# Patient Record
Sex: Female | Born: 1970 | Race: White | Hispanic: No | Smoking: Former smoker
Health system: Southern US, Community
[De-identification: ages and names within clinical notes are randomized; demographics above are authoritative.]

## PROBLEM LIST (undated history)

## (undated) DIAGNOSIS — F32A Depression, unspecified: Secondary | ICD-10-CM

## (undated) DIAGNOSIS — Z950 Presence of cardiac pacemaker: Secondary | ICD-10-CM

## (undated) DIAGNOSIS — E7800 Pure hypercholesterolemia, unspecified: Secondary | ICD-10-CM

## (undated) DIAGNOSIS — K5900 Constipation, unspecified: Secondary | ICD-10-CM

## (undated) DIAGNOSIS — I509 Heart failure, unspecified: Secondary | ICD-10-CM

## (undated) DIAGNOSIS — M7989 Other specified soft tissue disorders: Secondary | ICD-10-CM

## (undated) DIAGNOSIS — R519 Headache, unspecified: Secondary | ICD-10-CM

## (undated) DIAGNOSIS — M549 Dorsalgia, unspecified: Secondary | ICD-10-CM

## (undated) DIAGNOSIS — R001 Bradycardia, unspecified: Secondary | ICD-10-CM

## (undated) DIAGNOSIS — F419 Anxiety disorder, unspecified: Secondary | ICD-10-CM

## (undated) DIAGNOSIS — K219 Gastro-esophageal reflux disease without esophagitis: Secondary | ICD-10-CM

## (undated) DIAGNOSIS — M2550 Pain in unspecified joint: Secondary | ICD-10-CM

## (undated) DIAGNOSIS — R569 Unspecified convulsions: Secondary | ICD-10-CM

## (undated) DIAGNOSIS — Z8774 Personal history of (corrected) congenital malformations of heart and circulatory system: Secondary | ICD-10-CM

## (undated) DIAGNOSIS — R011 Cardiac murmur, unspecified: Secondary | ICD-10-CM

## (undated) DIAGNOSIS — T7840XA Allergy, unspecified, initial encounter: Secondary | ICD-10-CM

## (undated) DIAGNOSIS — D649 Anemia, unspecified: Secondary | ICD-10-CM

## (undated) DIAGNOSIS — R002 Palpitations: Secondary | ICD-10-CM

## (undated) DIAGNOSIS — E559 Vitamin D deficiency, unspecified: Secondary | ICD-10-CM

## (undated) DIAGNOSIS — R51 Headache: Secondary | ICD-10-CM

## (undated) DIAGNOSIS — R7303 Prediabetes: Secondary | ICD-10-CM

## (undated) HISTORY — DX: Constipation, unspecified: K59.00

## (undated) HISTORY — DX: Cardiac murmur, unspecified: R01.1

## (undated) HISTORY — DX: Gastro-esophageal reflux disease without esophagitis: K21.9

## (undated) HISTORY — PX: CARDIAC VALVE REPLACEMENT: SHX585

## (undated) HISTORY — PX: TUBAL LIGATION: SHX77

## (undated) HISTORY — DX: Bradycardia, unspecified: R00.1

## (undated) HISTORY — PX: TONSILLECTOMY: SHX5217

## (undated) HISTORY — PX: TETRALOGY OF FALLOT REPAIR: SHX796

## (undated) HISTORY — DX: Anxiety disorder, unspecified: F41.9

## (undated) HISTORY — DX: Depression, unspecified: F32.A

## (undated) HISTORY — DX: Allergy, unspecified, initial encounter: T78.40XA

## (undated) HISTORY — DX: Heart failure, unspecified: I50.9

## (undated) HISTORY — DX: Pure hypercholesterolemia, unspecified: E78.00

## (undated) HISTORY — PX: PACEMAKER INSERTION: SHX728

## (undated) HISTORY — DX: Anemia, unspecified: D64.9

## (undated) HISTORY — DX: Other specified soft tissue disorders: M79.89

## (undated) HISTORY — DX: Prediabetes: R73.03

## (undated) HISTORY — DX: Pain in unspecified joint: M25.50

## (undated) HISTORY — DX: Palpitations: R00.2

## (undated) HISTORY — DX: Vitamin D deficiency, unspecified: E55.9

## (undated) HISTORY — DX: Dorsalgia, unspecified: M54.9

---
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
## 1998-05-07 ENCOUNTER — Other Ambulatory Visit: Admission: RE | Admit: 1998-05-07 | Discharge: 1998-05-07 | Payer: Self-pay | Admitting: *Deleted

## 1999-09-17 ENCOUNTER — Other Ambulatory Visit: Admission: RE | Admit: 1999-09-17 | Discharge: 1999-09-17 | Payer: Self-pay | Admitting: *Deleted

## 2000-08-23 DIAGNOSIS — R569 Unspecified convulsions: Secondary | ICD-10-CM

## 2000-08-23 DIAGNOSIS — I509 Heart failure, unspecified: Secondary | ICD-10-CM

## 2000-08-23 HISTORY — DX: Heart failure, unspecified: I50.9

## 2000-08-23 HISTORY — DX: Unspecified convulsions: R56.9

## 2000-11-01 ENCOUNTER — Other Ambulatory Visit: Admission: RE | Admit: 2000-11-01 | Discharge: 2000-11-01 | Payer: Self-pay | Admitting: *Deleted

## 2001-07-16 ENCOUNTER — Inpatient Hospital Stay (HOSPITAL_COMMUNITY): Admission: AD | Admit: 2001-07-16 | Discharge: 2001-07-16 | Payer: Self-pay | Admitting: *Deleted

## 2001-07-26 ENCOUNTER — Encounter (INDEPENDENT_AMBULATORY_CARE_PROVIDER_SITE_OTHER): Payer: Self-pay

## 2001-07-26 ENCOUNTER — Inpatient Hospital Stay (HOSPITAL_COMMUNITY): Admission: AD | Admit: 2001-07-26 | Discharge: 2001-07-29 | Payer: Self-pay | Admitting: Obstetrics and Gynecology

## 2001-08-02 ENCOUNTER — Inpatient Hospital Stay (HOSPITAL_COMMUNITY): Admission: EM | Admit: 2001-08-02 | Discharge: 2001-08-06 | Payer: Self-pay | Admitting: *Deleted

## 2001-08-03 ENCOUNTER — Encounter: Payer: Self-pay | Admitting: Internal Medicine

## 2001-08-04 ENCOUNTER — Encounter: Payer: Self-pay | Admitting: Internal Medicine

## 2001-08-04 ENCOUNTER — Encounter: Payer: Self-pay | Admitting: Neurology

## 2001-08-05 ENCOUNTER — Encounter: Payer: Self-pay | Admitting: Cardiology

## 2001-08-31 ENCOUNTER — Other Ambulatory Visit: Admission: RE | Admit: 2001-08-31 | Discharge: 2001-08-31 | Payer: Self-pay | Admitting: *Deleted

## 2003-02-24 ENCOUNTER — Emergency Department (HOSPITAL_COMMUNITY): Admission: EM | Admit: 2003-02-24 | Discharge: 2003-02-25 | Payer: Self-pay | Admitting: *Deleted

## 2003-02-24 ENCOUNTER — Encounter: Payer: Self-pay | Admitting: *Deleted

## 2005-05-24 ENCOUNTER — Other Ambulatory Visit: Admission: RE | Admit: 2005-05-24 | Discharge: 2005-05-24 | Payer: Self-pay | Admitting: Obstetrics and Gynecology

## 2006-05-02 ENCOUNTER — Ambulatory Visit: Payer: Self-pay

## 2006-08-10 ENCOUNTER — Ambulatory Visit: Payer: Self-pay | Admitting: Internal Medicine

## 2006-10-28 ENCOUNTER — Emergency Department (HOSPITAL_COMMUNITY): Admission: EM | Admit: 2006-10-28 | Discharge: 2006-10-28 | Payer: Self-pay | Admitting: Emergency Medicine

## 2006-11-08 ENCOUNTER — Ambulatory Visit (HOSPITAL_COMMUNITY): Admission: RE | Admit: 2006-11-08 | Discharge: 2006-11-08 | Payer: Self-pay | Admitting: Orthopedic Surgery

## 2006-11-09 ENCOUNTER — Ambulatory Visit: Payer: Self-pay | Admitting: Internal Medicine

## 2007-02-10 ENCOUNTER — Ambulatory Visit: Payer: Self-pay | Admitting: Internal Medicine

## 2007-07-11 ENCOUNTER — Ambulatory Visit: Payer: Self-pay | Admitting: Internal Medicine

## 2007-10-16 ENCOUNTER — Ambulatory Visit: Payer: Self-pay | Admitting: Internal Medicine

## 2008-02-19 ENCOUNTER — Ambulatory Visit: Payer: Self-pay

## 2008-03-02 ENCOUNTER — Emergency Department (HOSPITAL_COMMUNITY): Admission: EM | Admit: 2008-03-02 | Discharge: 2008-03-02 | Payer: Self-pay | Admitting: Emergency Medicine

## 2008-07-09 ENCOUNTER — Ambulatory Visit: Payer: Self-pay | Admitting: Internal Medicine

## 2008-12-06 ENCOUNTER — Encounter (INDEPENDENT_AMBULATORY_CARE_PROVIDER_SITE_OTHER): Payer: Self-pay | Admitting: *Deleted

## 2009-01-08 ENCOUNTER — Encounter: Payer: Self-pay | Admitting: Internal Medicine

## 2009-01-08 ENCOUNTER — Ambulatory Visit: Payer: Self-pay

## 2009-06-22 ENCOUNTER — Emergency Department (HOSPITAL_COMMUNITY): Admission: EM | Admit: 2009-06-22 | Discharge: 2009-06-22 | Payer: Self-pay | Admitting: Emergency Medicine

## 2009-10-13 DIAGNOSIS — I509 Heart failure, unspecified: Secondary | ICD-10-CM | POA: Insufficient documentation

## 2009-10-13 DIAGNOSIS — I495 Sick sinus syndrome: Secondary | ICD-10-CM

## 2009-10-14 ENCOUNTER — Ambulatory Visit: Payer: Self-pay | Admitting: Internal Medicine

## 2009-10-16 DIAGNOSIS — Z950 Presence of cardiac pacemaker: Secondary | ICD-10-CM

## 2009-10-16 DIAGNOSIS — I10 Essential (primary) hypertension: Secondary | ICD-10-CM

## 2010-05-07 ENCOUNTER — Encounter: Payer: Self-pay | Admitting: Internal Medicine

## 2010-05-07 ENCOUNTER — Ambulatory Visit: Payer: Self-pay

## 2010-09-24 NOTE — Procedures (Signed)
Summary: pcp   Current Medications (verified): 1)  Zyrtec Allergy 10 Mg Tabs (Cetirizine Hcl) .... As Needed  Allergies (verified): 1)  * Sulfa  PPM Specifications Following MD:  Lewayne Bunting, MD     PPM Vendor:  Medtronic     PPM Model Number:  [id number]     PPM Serial Number:  [id number] PPM DOI:  08/03/2001      Lead 1    Location: RA     DOI: 08/03/2001     Model #: [id number]     Serial #: [id number]     Status: active Lead 2    Location: RV     DOI: 08/03/2001     Model #: [id number]     Serial #: [id number]     Status: active  Magnet Response Rate:  BOL 85 ERI  65  Indications:  BRADYCARDIA, TETRALOGY REPAIR 1975    PPM Follow Up Battery Voltage:  2.69 V     Battery Est. Longevity:  14 mths     Pacer Dependent:  No       PPM Device Measurements Atrium  Amplitude: 4.00 mV, Impedance: 525 ohms, Threshold: 0.50 V at 0.40 msec Right Ventricle  Amplitude: 22.40 mV, Impedance: 536 ohms, Threshold: 0.50 V at 0.40 msec  Episodes MS Episodes:  12     Percent Mode Switch:  <0.1%     Coumadin:  No Ventricular High Rate:  0     Atrial Pacing:  36.2%     Ventricular Pacing:  3.0%  Parameters Mode:  DDDR     Lower Rate Limit:  50     Upper Rate Limit:  130 Paced AV Delay:  200     Sensed AV Delay:  140 Next Cardiology Appt Due:  10/22/2010 Tech Comments:  NORMAL DEVICE FUNCTION.  CHANGED RA OUTPUT FROM 1.5 TO 2.0 AND RV OUTPUT FROM 1.625 TO 2.50 V.  ROV IN 6 MTHS W/GT. Vella Kohler  May 07, 2010 11:29 AM

## 2010-09-24 NOTE — Cardiovascular Report (Signed)
Summary: Office Visit   Office Visit   Imported By: Roderic Ovens 10/23/2009 14:00:14  _____________________________________________________________________  External Attachment:    Type:   Image     Comment:   External Document

## 2010-09-24 NOTE — Assessment & Plan Note (Signed)
Summary: pc2/sl   Visit Type:  Follow-up Primary Provider:  Patrecia Pace, MD [facility]  CC:  swelling in hands and ankles..  History of Present Illness: Tonya Gardner returns today for followup of her PPM along with HTN and bradycardia.  She is a pleasant 40 yo woman who developed severe bradycardia after her pregnancy and underwent PPM.  She was ultimately found to have eclampsia which maniested several days after delivery.  She returns for followup.  The patient has lost about 40 lbs since her last visit.  She is exercising regularly.  Current Medications (verified): 1)  Zyrtec Allergy 10 Mg Tabs (Cetirizine Hcl) .... 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  Review of Systems  The patient denies chest pain, syncope, dyspnea on exertion, and peripheral edema.    Vital Signs:  Patient profile:   40 year old female Height:      62 inches Weight:      177 pounds BMI:     32.49 Pulse rate:   60 / minute BP sitting:   122 / 88  (left arm)  Vitals Entered By: Laurance Flatten CMA (October 14, 2009 9:01 AM)  Physical Exam  General:  Obese, well developed, well nourished, in no acute distress.  HEENT: normal Neck: supple. No JVD. Carotids 2+ bilaterally no bruits Cor: RRR no rubs, gallops or murmur Lungs: CTA Ab: soft, nontender. nondistended. No HSM. Good bowel sounds Ext: warm. no cyanosis, clubbing or edema Neuro: alert and oriented. Grossly nonfocal. affect pleasant    PPM Specifications Following MD:  Lewayne Bunting, MD     PPM Vendor:  Medtronic     PPM Model Number:  [id number]     PPM Serial Number:  [id number] H PPM DOI:  08/03/2001      Lead 1    Location: RA     DOI: 08/03/2001     Model #: 9562     Serial #:  [id number]     Status: active Lead 2    Location: RV     DOI: 08/03/2001     Model #: 6962     Serial #: [id number]     Status: active  Magnet Response Rate:  BOL 85 ERI  65  Indications:  BRADYCARDIA, TETRALOGY REPAIR 1975    PPM Follow Up Remote Check?  No Battery Voltage:  2.71 V     Battery Est. Longevity:  19 months     Pacer Dependent:  No       PPM Device Measurements Atrium  Amplitude: 1.4 mV, Impedance: 512 ohms, Threshold: 0.5 V at 0.4 msec Right Ventricle  Amplitude: 15.68 mV, Impedance: 540 ohms, Threshold: 0.75 V at 0.4 msec  Episodes MS Episodes:  11     Percent Mode Switch:  <0.1%     Coumadin:  No Ventricular High Rate:  1     Atrial Pacing:  32.8%     Ventricular Pacing:  3.0%  Parameters Mode:  DDDR     Lower Rate Limit:  50     Upper Rate Limit:  130 Paced AV Delay:  200     Sensed AV Delay:  140 Tech Comments:  No parameter changes.  Checked by [employer].  ROV 6 months clinic. Altha Harm, LPN  October 14, 2009 9:30 AM  MD Comments:  Agree with above.  Impression & Recommendations:  Problem # 1:  CARDIAC PACEMAKER IN SITU (ICD-V45.01) Her device is working normally though she has less than 2 yrs of longevity. Will recheck in several months.  Problem # 2:  ESSENTIAL HYPERTENSION, BENIGN (ICD-401.1) Her blood pressure is well controlled.  I have encouraged her to continue to exercise, and maintain a low sodium diet.  Patient Instructions: 1)  Your physician recommends that you schedule a follow-up appointment in: 6 months with device clinic and 12 months with Dr Ladona Ridgel

## 2010-09-24 NOTE — Cardiovascular Report (Signed)
Summary: Office Visit   Office Visit   Imported By: Roderic Ovens 05/11/2010 15:54:32  _____________________________________________________________________  External Attachment:    Type:   Image     Comment:   External Document

## 2010-11-03 ENCOUNTER — Encounter: Payer: Self-pay | Admitting: Internal Medicine

## 2010-11-03 ENCOUNTER — Encounter (INDEPENDENT_AMBULATORY_CARE_PROVIDER_SITE_OTHER): Payer: Self-pay | Admitting: Internal Medicine

## 2010-11-03 DIAGNOSIS — I495 Sick sinus syndrome: Secondary | ICD-10-CM

## 2010-11-03 DIAGNOSIS — I10 Essential (primary) hypertension: Secondary | ICD-10-CM

## 2010-11-10 NOTE — Assessment & Plan Note (Signed)
Summary: pacer check/cy   Visit Type:  Follow-up Primary Provider:  Patrecia Pace, MD [facility]   History of Present Illness: Tonya Gardner returns today for followup of her PPM along with HTN and bradycardia.  She is a pleasant 40 yo woman who developed severe bradycardia after her pregnancy and underwent PPM.  She was ultimately found to have eclampsia which maniested several days after delivery.  She returns for followup.  The patient has had no syncope. It is unclear whether she needs her PPM any more.  Current Medications (verified): 1)  Zyrtec Allergy 10 Mg Tabs (Cetirizine Hcl) .... 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  Vital Signs:  Patient profile:   40 year old female Height:      62 inches Weight:      180 pounds BMI:     33.04 Pulse rate:   60 / minute BP sitting:   110 / 70  (left arm)  Vitals Entered By: Tonya Gardner CMA (November 03, 2010 9:08 AM)  Physical Exam  General:  Obese, well developed, well nourished, in no acute distress.  HEENT: normal Neck: supple. No JVD. Carotids 2+ bilaterally no bruits Cor: RRR no rubs, gallops or murmur Lungs: CTA. Well healed PPM incision. Ab: soft, nontender. nondistended. No HSM. Good bowel sounds Ext: warm. no cyanosis, clubbing or edema Neuro: alert and oriented. Grossly nonfocal. affect pleasant    PPM Specifications Following MD:  Lewayne Bunting, MD     PPM Vendor:  Medtronic     PPM Model Number:  [id number]     PPM Serial Number:  [id number] H PPM DOI:  08/03/2001      Lead 1    Location: RA     DOI: 08/03/2001     Model #: 9147     Serial #: [id number]     Status: active Lead 2    Location: RV     DOI: 08/03/2001     Model #: 8657     Serial #: [id number]     Status:  active  Magnet Response Rate:  BOL 85 ERI  65  Indications:  BRADYCARDIA, TETRALOGY REPAIR 1975    PPM Follow Up Pacer Dependent:  No      Episodes Coumadin:  No  Parameters Mode:  DDDR     Lower Rate Limit:  50     Upper Rate Limit:  130 Paced AV Delay:  200     Sensed AV Delay:  140 MD Comments:  She is approaching ERI.  Impression & Recommendations:  Problem # 1:  CARDIAC PACEMAKER IN SITU (ICD-V45.01) Her device is working normally but is approaching ERI. It is unclear whether she still needs pacing. She is atrial pacing about 30% of the time but this could be related to HR's just below her lower rate. I have recommended that we turn her PPM down and see how she does. If she cannot tell a difference with her PPM set at 35/min, then I would recommend that we not replace the PPM.  Problem # 2:  ESSENTIAL HYPERTENSION, BENIGN (ICD-401.1) Her blood pressure appears to be mostly well controlled.  Patient Instructions: 1)  Your physician wants you to follow-up in: 2 months in the device clinic and 4 months with Dr Johney Frame  You will receive a reminder letter in the mail two months in advance. If you don't receive a letter, please call our office to schedule the follow-up appointment. 2)  Your physician recommends that you continue on your current medications as directed. Please refer to the Current Medication list given to you today.

## 2010-11-10 NOTE — Cardiovascular Report (Signed)
Summary: Office Visit   Office Visit   Imported By: Roderic Ovens 11/04/2010 11:53:28  _____________________________________________________________________  External Attachment:    Type:   Image     Comment:   External Document

## 2011-01-05 NOTE — Assessment & Plan Note (Signed)
[facility]                         ELECTROPHYSIOLOGY OFFICE NOTE   Tonya Gardner, Tonya Gardner                       MRN:          [id number]  DATE:07/09/2008                            DOB:          1970/08/27    Ms. Roan returns today for followup.  She is status post implantation  of a Medtronic Kappa 900 dual-chamber pacemaker back in December 2002.  She denies chest pain or shortness of breath.  She had no specific  complaints today.   MEDICATIONS:  The patient is on no medicines.   On physical exam, she is a pleasant, well-appearing woman in no  distress.  Blood pressure was 110/66, the pulse 79 and regular,  respirations were 18.  The weight was 196 pounds.  Neck revealed no  jugular venous distention.  Lungs clear bilaterally to auscultation.  No  wheezes, rales, or rhonchi are present.  No increased work of breathing.  Cardiovascular exam revealed a regular rate and rhythm.  Normal S1 and  S2.  Abdominal exam was soft and nontender.  Extremities demonstrated no  edema.   Interrogation of her pacemaker demonstrates a Medtronic Kappa 900.  The  P and R waves were 2 and 15, respectively.  The impedance 506 in the A  and 553 in the V.  The threshold 0.25 at 0.4 in the A and 0.5 at 0.4 in  the RV.  The battery voltage was 2.74 V.  The patient was mode switched  4 times less than 0.1%, 47% A paced and 2% V paced.  Today, the  pacemaker was reprogrammed today with outputs decreased in the atrium to  1.75 at 0.27 down from 2.4 and the atrial rate was decreased down to 50  beats per minute.  Hopefully, we can reduce battery drain by doing this.   IMPRESSION:  1. Symptomatic bradycardia.  2. Status post pacemaker insertion.   DISCUSSION:  Ms. Partin is stable.  I have asked that she lose some  weight today.  She has gradually gained weight over the last several  years.  She has had no other complaints, and we will plan to see the  patient back in the  office in 1 year.     Doylene Canning. Ladona Ridgel, MD  Electronically Signed    GWT/MedQ  DD: 07/09/2008  DT: 07/10/2008  Job #: [id number]

## 2011-01-05 NOTE — Assessment & Plan Note (Signed)
[facility]                         ELECTROPHYSIOLOGY OFFICE NOTE   OLIMPIA, TINCH                       MRN:          [id number]  DATE:07/11/2007                            DOB:          1971-01-03    Ms. Rembold returns today for followup.  She is a very pleasant 40-year-  old woman with a history of symptomatic bradycardia, which occurred in  the setting of eclampsia.  Her bradycardia preceded her eclampsia and  she underwent permanent pacemaker insertion.  She returns today for  followup.  She denies chest pain or shortness of breath.  She has  otherwise been stable.  She has just joined a gym and is trying to lose  some weight.   MEDICATIONS:  She is on no medicines.   EXAM:  She is a pleasant, young woman, in no acute distress.  Her blood pressure was 121/84, pulse was 84 and regular, respirations  were 18, the weight was 177 pounds.  NECK:  Revealed no jugular venous distention.  LUNGS:  Clear bilaterally to auscultation, no wheezes, rales or rhonchi.  There is no increased work of breathing.  CARDIOVASCULAR EXAM:  Revealed a regular rate and rhythm with normal S1  and S2.  EXTREMITIES:  Demonstrated no edema.   Interrogation of her pacemaker demonstrates a Medtronic Kappa 900 with P-  waves of 1.5 and R-waves of 15.  The impedance 527 in the atrium, 549 in  the ventricle, the threshold 0.5 at 0.4 in the atrium, 0.75 at 0.4 in  the right ventricle.  She was pacing in the atrium 45% of the time, in  the ventricle 3% of the time.  Her AV delay was 200/140.   IMPRESSION:  1. Symptomatic bradycardia.  2. Status post pacemaker insertion.   DISCUSSION:  Overall, Ms. Pagett is stable.  Her pacemaker is working  normally.  We plan to see her back in the office in one year for device  followup.     Doylene Canning. Ladona Ridgel, MD  Electronically Signed    GWT/MedQ  DD: 07/11/2007  DT: 07/12/2007  Job #: [id number]

## 2011-01-08 NOTE — Op Note (Signed)
[facility]  Patient:    Tonya Gardner, Tonya Gardner Visit Number: [id number] MRN: [id number]          Service Type: OBS Location: [id number] Attending Physician:  Rhina Brackett Dictated by:   Juluis Mire, M.D. Proc. Date: 07/28/01 Admit Date:  07/26/2001 Discharge Date: 07/29/2001                             Operative Report  PREOPERATIVE DIAGNOSIS:       Multiparity, desires sterility.  POSTOPERATIVE DIAGNOSIS:      Multiparity, desires sterility.  OPERATIVE PROCEDURE:          Postpartum bilateral tubal ligation.  SURGEON:                      Juluis Mire, M.D.  ANESTHESIA:                   Epidural.  ESTIMATED BLOOD LOSS:         Minimal.  PACKS AND DRAINS:             None.  INTRAOPERATIVE BLOOD REPLACED:               None.  COMPLICATIONS:                None.  INDICATIONS:                  The patient is a 40 year old gravida 3, para 1-1-0-2 married white female who had a spontaneous vaginal delivery yesterday. She is desirous of permanent sterilization.  Alternative forms of birth control had been discussed.  The potential irreversibility of sterilization was explained.  A failure rate of 1 in 200 was quoted.  Failure can be in the form of ectopic pregnancy, requiring further surgical management.  The risks of surgery have been discussed including the risk of anesthesia; the risk of incisional infection or bruising; the risk of vascular injury that could require transfusion or the need for exploratory surgery; the risk of injury to adjacent organs including bladder or bowel that could require further exploratory surgery; the risk of deep venous thrombosis and pulmonary embolus. The patient expressed and understanding of the indications and risks.  DESCRIPTION OF PROCEDURE:     The patient was taken to the OR and placed in the supine position.  After a satisfactory level of epidural anesthesia was obtained, the abdomen was  prepped out with Betadine.  The bladder was emptied by in-and-out catheterization.  The patient was then draped out for surgery. A subumbilical incision was made with a knife and carried through the subcutaneous tissue.  The fascia was identified and entered sharply.  The incision in the fascia was extended laterally.  The peritoneum was entered sharply.  There was no evidence of injury to adjacent organs.  The patient was placed in Trendelenburg position.  The right tube was first identified and followed out to its fimbriated end.  A midsegment of tube was elevated through the incision.  A hole was made in an avascular area of the mesosalpinx using the Bovie.  A segment of tube was ligated using 0 plain catgut.  The intervening segment of tube was then excised.  The cut ends of the tube were then cauterized.  Hemostasis was excellent.  The right ovary was normal. Next, the left tube was identified.  It was followed out to its  fimbriated end.  A midsegment of tube was elevated through the incision.  A hole was made in an avascular area of the mesosalpinx.  Individual ligatures of 0 plain catgut were used to ligated a segment of tube.  The intervening segment of tube was excised.  The cut ends of the tube were then cauterized using the Bovie.  Hemostasis was excellent.  The left ovary was normal.  No bleeding was encountered.  The peritoneum was closed with a pursestring suture of 2-0 Vicryl.  The fascia was closed with running suture of 0 Vicryl.  The skin was closed with a running subcuticular 4-0 Vicryl.  The patient was taken out of the Trendelenburg position.  Once stable, she was transferred to the recovery room in good condition.  Sponge, needle and instrument counts were reported as correct by the circulating nurse x 2. Dictated by:   Juluis Mire, M.D. Attending Physician:  Rhina Brackett DD:  07/28/01 TD:  07/28/01 Job: [id number]

## 2011-01-08 NOTE — H&P (Signed)
[place]  Patient:    Tonya Gardner, Tonya Gardner Visit Number: [id number] MRN: [id number]          Service Type: MED Location: [phone] Attending Physician:  Nathen May Dictated by:   Nathen May, M.D., [facility] Admit Date:  08/02/2001   CC:         [facility], M.D.  [place], Attention: Device Clinic   History and Physical  HISTORY OF PRESENT ILLNESS:  Tonya Gardner is a 40 year old woman admitted because of progressive dyspnea.  She is now six days postpartum, having delivered a healthy baby girl.  Her past records are not yet available from this pregnancy.  Her cardiac history is notable for trilogy of Fallot repair at the age of 2-1/2 at [facility].  She was released from medical followup at age 40. There were comments in her past related to need for pacemaker.  She does recall having heard the term atrial flutter.  She also has been aware of heart rates in the 50s.  Following her delivery last weeks, she had tubal ligation.  Since that time, she has noted progressive lower extremity edema with increase in orthopnea, exertional dyspnea, and epigastric chest discomfort.  She has had occasional palpitations with this.  She went to [facility], and Dr. Collins Scotland has referred her to the hospital because her heart rate was documented in the 30s.  She denies history of syncope or presyncope.  She also notes no functional impairment.  PAST MEDICAL HISTORY:  Her thyroids are presumably normal.  PAST SURGICAL HISTORY:  Notable for tonsillectomy as well as tetralogy of Fallot repair.  Her cardiac risk factors are negative for diabetes or hypertension.  There is a family history with the mother having heart disease.  MEDICATIONS:  None.  ALLERGIES:  SULFA.  SOCIAL HISTORY AND REVIEW OF SYSTEMS:  As outlined on [facility] See, P.A. admission note and are not reviewed  here.  PHYSICAL EXAMINATION:  GENERAL:  She is a well-developed, well-nourished, modestly obese white female in mild respiratory distress.  VITAL SIGNS:  Blood pressure 136/62, pulse 35, respirations 18 and minimally labored.  HEENT:  No scleral icterus and no xanthomata.  NECK:  Her neck veins were not able to be discerned.  Carotids are brisk bilaterally without bruits.  There was no thyromegaly.  BACK:  Without kyphosis or scoliosis.  CHEST:  Lung sounds were decreased.  CARDIAC:  Her heart sounds were distant and difficult to hear in the emergency room with the suggestion of a 2/6 systolic ejection murmur.  ABDOMEN:  Soft with active bowel sounds.  There is no hepatosplenomegaly.  EXTREMITIES:  Femoral pulses were 2+.  Distal pulses were intact.  There is 1 to 2+ edema.  NEUROLOGIC:  Grossly normal.  SKIN:  Warm and dry.  LABORATORY DATA:  Electrocardiogram demonstrated a sinus rhythm at 32 with intervals of 0.13/0.l4/0.40.  There is right bundle branch block.  Chest x-ray done demonstrated cardiomegaly with mild vascular congestion.  Her laboratories are notable for an albumin of 2.5.  Hemoglobin 11.4.  BNP 871.  SGPT 58.  BMET was otherwise normal.  IMPRESSION: 1. Profound sinus bradycardia. 2. Congestive heart failure postpartum. Dictated by:   Nathen May, M.D., [facility] Attending Physician:  Nathen May DD:  08/02/01 TD:  08/03/01 Job: [id number]

## 2011-01-08 NOTE — H&P (Signed)
[place]. [facility]  Patient:    TYAH, ACORD Visit Number: [id number] MRN: [id number]          Service Type: MED Location: [phone] Attending Physician:  Nathen May Dictated by:   Nathen May, M.D., [facility] Admit Date:  08/02/2001   CC:         Tammy R. Collins Scotland, M.D.  Electrophysiology Laboratory  [facility]  Minette Headland, M.D.   History and Physical  HISTORY OF PRESENT ILLNESS:  Mrs. Tonya Gardner is admitted to the hospital because of progressive dyspnea and evidence of congestive heart failure six days postpartum.  Mrs. Bells is a 40 year old woman who is six days status post helped delivery of a little girl after uncomplicated pregnancy.  Over the ensuing day, she has had progressive shortness of breath, orthopnea, and exercise intolerance accompanied by chest pain and edema.  She presented to her primary physician today with the above complaints and was found to have a heart rate of 32 and was referred to the emergency room.  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  She was released from cardiology follow-up (?) at age 53 and has had no functional impairments.  She had a prior healthy delivery about eight years ago.  She has had no prior history of syncope or presyncope.  She has heard the ______ flutter in the past as well as having been the the subject of a discussion regarding pacemaking about 15 years ago.  PAST MEDICAL HISTORY:  Notable as above.  PAST SURGICAL HISTORY:  Tonsillectomy.  CARDIAC RISK FACTORS:  Negative for diabetes or hypertension.  There is a family history of heart disease.  ALLERGIES:  SULFA.  CURRENT MEDICATIONS:  She takes no medications.  SOCIAL HISTORY, REVIEW OF SYSTEMS:  Noted on the intake sheet from [facility] ______ .  PHYSICAL EXAMINATION:  She is a young, somewhat obese, Caucasian female in no acute  distress.  VITAL SIGNS:  Blood pressure 132/62.  Pulse 36.  Respirations 18 and mildly labored.  HEENT:  Demonstrated no scleral icterus, no xanthoma.  NECK:  Neck veins were not discernible.  There were no carotid bruits.  BACK:  Without kyphosis, scoliosis.  LUNGS:  Clear with decreased breath sounds at the bases.  CARDIAC:  Demonstrated nonpalpable PMI.  The rhythm was regular.  The heart sounds were distant and hard to hear in the emergency room.  There seemed to be a systolic murmur at the left upper external border.  ABDOMEN:  Soft with active bowel sounds without midline pulsation.  EXTREMITIES:  Femoral pulses were 2+ distal.  Pulses were intact.  There was 1+ peripheral edema.  There is no clubbing or cyanosis.  SKIN:  Warm and dry.  NEUROLOGIC:  Grossly normal.  ANCILLARY DATA:  The first electrocardiogram demonstrated sinus rhythm at 32 beats per minute with intervals of 0.14/0.14/0.48 with a right bundle branch block and a right axis deviation pattern.  Chest x-ray demonstrated cardiomegaly with mild vascular congestion.  IMPRESSION: 1. Sinus bradycardia. 2. Congestive failure developing postpartum. 3. History of tetralogy of Fallot repair at age [age] without    antecedent symptoms. 4. Second child was born six days ago. 5. Family history of early coronary disease. 6. Hypoalbuminemia, question cause.  DISCUSSION:  Ms. 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  First it should be noted that sinus node dysfunction occurs but is really quite infrequent in patients with tetralogy of Fallot repair.  I discussed this with Dr. Edsel Petrin who confirms the unusual nature of this.  The congestive heart failure postpartum is obviously concerning.  The potential mechanisms include aggravation of her underlying  corrected congenital heart disease, a peripartum issue, or aggravated/triggered by her profound bradycardia.  The mechanisms of her bradycardia could include either the tetralogy of Fallot repair primarily or aggravation of a bradycardic tendency related to right atrial stretch secondary to increased right ventricular pressures in the context of congestive heart failure.  There are a couple of issues.  Given the extreme bradycardia, pacing is indicated.  I also confirmed this with Dr. Edsel Petrin.  The next issue is what is the status of her myocardial function and she will need to undergo ultrasonography in the morning to assess this.  Medical therapy may well be indicated in the event that left ventricular dysfunction is identified.  PLAN FOR TONIGHT:  We will admit her to telemetry, diurese her, make sure that she remains potassium-replete.  We will undertake an urgent echocardiogram in the morning with anticipated pacing tomorrow with attempts at a mid septal position.  We will check a TSH though I assume that this was normal during her pregnancy.  Those records, as noted, are not available.  Parenthetically, her postpartum heart rates were noted to be in the mid to low 60s and to mid 70s.  Will also obtain a urinalysis to see whether she is wasting protein through her urine. Dictated by:   Nathen May, M.D., [facility] Attending Physician:  Nathen May DD:  08/02/01 TD:  08/03/01 Job: [id number]

## 2011-01-08 NOTE — Assessment & Plan Note (Signed)
[facility]                           ELECTROPHYSIOLOGY OFFICE NOTE   MELVINIA, Tonya                       MRN:          [id number]  DATE:05/02/2006                            DOB:          02/01/1971    PACEMAKER NOTE   Ms. Gardner was seen today in the clinic on May 02, 2006, for followup  of her Medtronic Model #901 __________ .  Date of implant was August 03, 2001, for bradycardia with repair tetralogy in 1975.  On interrogation of  her device today, her battery voltage was 2.75.  P-waves measured 2.8 to 4.0  millivolts with an atrial pacing threshold of 0.5 volts at 0.4 milliseconds  and an atrial lead impendence of 504 ohms.  R-waves measured 15.68 to 22.40  millivolts with a ventricular pacing threshold of 0.75 volts at 0.4  milliseconds and a ventricular lead impedence of 545 ohms.  There were 14  high atrial rate episodes noted.  I did take her paced AV delay out 200  milliseconds to allow for more intrinsic conduction.  No other changes were  made in her parameter.  She was signed up today for [facility] and will send  a transmission at 3, 6, and 9 months' time with a return office visit in 1  year.                                   Altha Harm, LPN                                Doylene Canning. Ladona Ridgel, MD   PO/MedQ  DD:  05/02/2006  DT:  05/02/2006  Job #:  [id number]

## 2011-01-08 NOTE — Discharge Summary (Signed)
[place]  Patient:    Tonya Gardner, Tonya Gardner Visit Number: [id number] MRN: [id number]          Service Type: MED Location: 2000 2033 01 Attending Physician:  Nathen May Dictated by:   Rozell Searing, P.A. Admit Date:  08/02/2001 Discharge Date: 08/06/2001   CC:         [facility] A. Orlin Hilding, M.D.  Dr. Marguerita Merles   Discharge Summary  REASON FOR ADMISSION:  Please refer to dictated admission note.  LABORATORY DATA:  WBC 8.8, hgb 11.4, hct 34.7 and platelets 318,000 on admission.  D-dimer 3.12.  INR of 1.1.  Potassium 4.3 on admission, low of 2.9-4.4 predischarge.  Normal renal function.  Low albumin 2.5, elevated AST 58, elevated ALT 154 and normal magnesium.  _____:  pH 7.1, TSH 3.24. Dilantin levels within normal limits.  HOSPITAL COURSE:  Patient is a 40 year old female with cardiac history notable for status post tetralogy of Fallot repair, 3, at [facility] (age 57.5). She was followed by Dr. Lovena Neighbours.  She just gave birth to a baby girl six days prior to her hospitalization.  Patient presented with progressive dyspnea, orthopnea, exertional dyspnea, epigastric pain, and a mild nonproductive cough.  She denied any similar symptoms prior to her previous surgery.  An electrocardiogram at her primary care physicians office revealed extreme bradycardia and she was referred to the emergency room.  Patient was initially evaluated by Dr. Graciela Husbands who also found evidence of postpartum heart failure and patient was placed on Lasix diuretic regimen. Recommendation was to then proceed with a permanent pacemaker once patient was stabilized.  Two-D echocardiography was reviewed by Dr. Ladona Ridgel and revealed no evidence of biventricular failure.  Dr. Ladona Ridgel proceeded with placement of a dual chamber Medtronic pacemaker with no noted complications, (Kappa KDR-901, serial number [id number]).  Procedure was complicated by the  development of severe headache.  A CT scan revealed no evidence of bleeding.  Patient then developed a generalized tonic clonic seizure.  Dr. Ladona Ridgel performed a bedside echocardiogram revealing appropriate placement of leads with no evidence of pericardial effusion.  The patient then has a second seizure episode resulting in urinary incontinence and she was started on Dilantin per Dr. Ladona Ridgel.  She was then evaluated by Dr. Orlin Hilding who felt that this could be due to eclampsia and patient was treated with magnesium.  Of note, patient gave history of childhood seizures.  Dr. Orlin Hilding also suggested continuing Dilantin for three months followed by a taper.  Patient also had a brief episode of sinus tachycardia in the absence of any associated chest pain, dyspnea or fever.  Dr. Antoine Poche was concerned that this might represent a pulmonary embolus.  A D-dimer was elevated, but a follow up CT scan of the chest was negative for pulmonary emboli.  Pacemaker was interrogated prior to discharge revealing all values within normal limits.  MEDICATIONS AT DISCHARGE:  Dilantin 300 mg q.h.s.  INSTRUCTIONS:  Patient is to raise her left arm gradually to her head and shoulder as outlined.  She is to call our office if she has any bleeding or swelling from the site.  FOLLOW-UP:  The patient has been instructed to follow up with Dr. Marguerita Merles in one week for blood pressure check.  She is then to follow up with Dr. Orlin Hilding in approximately one month.  Patient is to call and schedule a follow up appointment with Dr. Ladona Ridgel in approximately two weeks for pacemaker site wound  check.  DISCHARGE DIAGNOSES: 1. Profound bradycardia:    a. status postpartum,    b. Medtronic dual chamber pacemaker implantation, December 12, complicated       by development of generalized seizures/headache secondary to eclampsia. 2. Postpartum congestive heart failure. 3. Status post tetralogy of Fallot repair. 4. Right bundle  branch block. Dictated by:   Rozell Searing, P.A. Attending Physician:  Nathen May DD:  09/04/01 TD:  09/04/01 Job: [id number]

## 2011-01-08 NOTE — Consult Note (Signed)
. [facility]  Patient:    Tonya Gardner, Tonya Gardner Visit Number: [id number] MRN: [id number]          Service Type: MED Location: MICU 2102 01 Attending Physician:  Nathen May Dictated by:   Gustavus Messing Orlin Hilding, M.D. Proc. Date: 08/03/01 Admit Date:  08/02/2001                            Consultation Report  CHIEF COMPLAINT:  Headache and seizure.  HISTORY OF PRESENT ILLNESS:  This is a 40 year old right-handed white female with complicated past medical history including tetrology of Falot which was corrected at age 73-1/2.  There was a question of a seizure.  Her husband says her family told him that she had "epileptic tendencies" prior to that, but no seizures since then.  She is recently postpartum, just one week ago, fullterm babygirl.  Normal spontaneous vaginal delivery.  This is the third child with the same father.  No history of preeclampsia or toxemia with this pregnancy. She had tubal ligation.  After discharge, she developed some lower extremity edema and also had some dyspnea on exertion and a cough.  She went to [facility] where an EKG showed she had severe bradycardia in the 30s and was then sent to the emergency room yesterday.  She had no syncope or presyncope.  She was found to be in CHF treated with Lasix.  She has no proteinuria and blood pressure was normal.  Edema has resolved considerably so toxemia does not appear likely.  She had a DDD pacer placed earlier today. Just after the pacer was placed, she developed severe frontal headache.  Stat CT was normal.  Shortly thereafter she had a generalized tonic clonic seizure. She was briefly postictal, then the headache seemed to have improved and she was largely returned to normal.  She was transferred to ICU for observation and then complained of her tongue feeling thick and the same frontal headache and another generalized tonic clonic seizure again briefly  followed by postictal behavior.  She also had some urinary incontinence.  She has again recovered.  REVIEW OF SYSTEMS:  As above.  She denies any visual loss, dizziness, weakness, or uncoordination, or any unilateral symptoms.  PAST MEDICAL HISTORY:  Significant for tetrology of Falot repaired at age 73-1/2.  Question of seizures as a child prior to repair of the tetrology. One week postpartum normal spontaneous vaginal delivery, third child, no complications.  Status post tubal ligation one week ago.  CHF, bradycardia, status post pacemaker placed today.  MEDICATIONS:  Tylenol and Lasix.  ALLERGIES:  SULFA.  SOCIAL HISTORY:  She is married and has three children.  Worked for [employer] but has been out for five months because of her pregnancy.  She does not smoke and is not using alcohol.  FAMILY HISTORY:  Positive for coronary artery disease, negative for seizure.  PHYSICAL EXAMINATION:  VITAL SIGNS:  Temperature 97.7, pulse 68, blood pressure 140/72, respirations 15.  91% saturation on 2 liters.  HEENT:  Head is normocephalic and atraumatic.  NECK:  Supple without bruits.  HEART:  Regular rate and rhythm.  LUNGS:  Clear to auscultation.  EXTREMITIES:  Minimal edema.  NEUROLOGICAL:  Mental status; she is awake and alert, oriented to hospital, although, she thought she was at [facility] [place].  She is oriented to the date, but she says only because she is looking at the  calendar.  She is oriented to situation.  There is normal language with normal naming, repetition, and comprehension and she has appropriate conversation.  Cranial nerves; pupils are equal and reactive, disk margins are quite sharp. Visual fields are full.  Extraocular movements are intact.  Facial sensation is normal.  Facial motor activity is normal.  Hearing is intact.  Palate is symmetric and tongue is midline.  Incidentally she has bilateral tongue lacerations.  Motor examination;  there is no drift.  She has 5/5 strength in all four extremities.  Reflexes are brisk at about 3+ with a few beats nonsustained clonus, upgoing toes.  Coordination; finger-to-nose, heel-to-shin are normal.  Sensory examination is normal.  CT scan of the brain without contrast is normal.  No evidence of subarachnoid hemorrhage or superior sagittal sinus thrombosis.  BMET from earlier today is completely normal. Urinalysis is negative for protein.  IMPRESSION:  Generalized seizure and severe headache, somewhat paroxysmal in presentation in complicated patient with tetrology of Falot, status post repair, and questionable history of seizures as a child, and recent vaginal delivery of a girl.  She now has a nonfocal neuro examination.  She is fairly alert and appropriate with less headache.  The etiology of this circumstance is not entirely clear.  Possibilities would include sinus thrombosis, pituitary apoplexy, vasospasm, and toxemia.  However, the CT is completely negative with no evidence of bleeding to suggest aneurysm.  Nothing on the CT which looks like sagittal sinus thrombosis, nothing in the pituitary, although, granted CT is far less sensitive than MRI for several of these findings.  Possibly vasospasm, toxemia which remains unlikely as she is normotensive and has no protein in her urine, is postpartum now by a week and this is the third child by the same father.  She had no symptoms around her delivery.  PLAN:  Would hold off on LP and angio for now, although, would have a low threshold to do these in the future if she has complications or seizures again. Would load her with Dilantin as soon as possible.  CT with contrast and EEG in the morning.  Toradol for pain.  Will check a magnesium level and CMET. Dictated by:   Gustavus Messing Orlin Hilding, M.D. Attending Physician:  Nathen May DD:  08/03/01 TD:  08/04/01 Job: [id number]

## 2011-01-08 NOTE — Op Note (Signed)
. [facility]  Patient:    Tonya Gardner, Tonya Gardner Visit Number: [id number] MRN: [id number]          Service Type: MED Location: MICU 2102 01 Attending Physician:  Nathen May Dictated by:   Doylene Canning. Ladona Ridgel, M.D. [facility] Proc. Date: 08/03/01 Admit Date:  08/02/2001   CC:         Tammy R. Collins Scotland, M.D.  Kathrine Cords, R.N., [facility]   Operative Report  PROCEDURE PERFORMED:  Dual chamber pacemaker implantation.  INDICATION:  Symptomatic sinus bradycardia with congestive heart failure.  I. INTRODUCTION:  The patient is a very pleasant 40 year old woman, who recently delivered a healthy spontaneous vaginal delivery.  Her past medical history is notable for tetralogy of Fallot with status post repair.  She was found to be bradycardic with a heart rate in the 30s.  Despite symptoms of congestive heart failure with both right and left-sided symptoms and is now referred for pacemaker implantation.  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  II. PROCEDURE:  After informed consent was obtained, the patient was taken to the diagnostic catheterization laboratory in the fasting state.  After the usual preparation and draping, intravenous fentanyl and midazolam were given for sedation.  A total of 30 cc of lidocaine was infiltrated into the left infraclavicular region.  Electrocautery was utilized to dissect down to the left subpectoralis fascia.  Ten cc of contrast were injected into the left upper extremity venous system demonstrating a patent left subclavian vein. The vein was subsequently punctured x 2 and two 7-French peel-away sheaths were placed in the left subclavian vein.  The Medtronic model 5076/52 cm active fixation lead  was placed in the right ventricle and the model 5076/45 cm active  fixation lead was placed in the right atrium.  The ventricular lead serial number was [id number] and the atrial lead serial number was [id number].  Mapping was carried out in the right ventricle.  It should be noted that placement of a lead in the right ventricle was made more difficult by chronically reduced R-waves.  However, at the final site, R-waves measured 16 mV and the pacing threshold was 0.4 volts at 0.5 msec once the lead was actively fixed with a pacing impedance of 896 ohms.  With the ventricular lead in satisfactory position, attention was turned to the atrial lead.  Mapping was carried out in the right atrium with P-waves measuring between 1.5 and 1.8 mV demonstrated.  This was in the right atrial appendage. At this location, the atrial threshold was 0.6 volts at 0.5 msec with a pacing impedance of 764 ohms.  At this location, the lead was actively fixed.  With both atrial and ventricular leads in satisfactory position, they were secured to the subpectoralis fascia with a figure-of-eight silk suture.  In addition, the sewing sleeves were secured with a silk suture.  Electrocautery was utilized to make a subcutaneous pocket.  Once this was done, the Medtronic Cappa KDR-901 dual-chamber pacemaker, serial number [id number] was  connected to the atrial and ventricular pacing leads and placed in the subcutaneous pocket.  Kanamycin irrigation was utilized to irrigate the pocket and the silk suture utilized to secure the generator to the fascia.  The wound was then closed with a layer of 2-0 Vicryl, followed by a layer of 3-0 Vicryl, followed by a layer of 4-0 Vicryl.  During the  closing of the wound, the patient complained of nausea and a severe headache.  The wound was closed in the usual manner.  She was subsequently taken to the CT scan, where a head CT without contrast demonstrated no intracranial bleeding.  Her headache was improved with intravenous narcotics.  She was returned  to her room in satisfactory condition.  III. COMPLICATIONS:  The procedure was complicated by a severe headache with no obvious etiology.  IV. RESULTS:  This demonstrates successful implantation of a Medtronic dual-chamber pacing system in a patient with symptomatic sinus bradycardia and underlying bifascicular block. Dictated by:   Doylene Canning. Ladona Ridgel, M.D. [facility] Attending Physician:  Nathen May DD:  08/03/01 TD:  08/03/01 Job: [id number]

## 2011-01-11 ENCOUNTER — Encounter: Payer: Self-pay | Admitting: *Deleted

## 2011-01-28 ENCOUNTER — Encounter: Payer: Self-pay | Admitting: *Deleted

## 2011-03-12 ENCOUNTER — Encounter: Payer: Self-pay | Admitting: Internal Medicine

## 2011-03-16 ENCOUNTER — Ambulatory Visit (INDEPENDENT_AMBULATORY_CARE_PROVIDER_SITE_OTHER): Payer: Self-pay | Admitting: Internal Medicine

## 2011-03-16 ENCOUNTER — Encounter: Payer: Self-pay | Admitting: Internal Medicine

## 2011-03-16 DIAGNOSIS — Z950 Presence of cardiac pacemaker: Secondary | ICD-10-CM

## 2011-03-16 DIAGNOSIS — I495 Sick sinus syndrome: Secondary | ICD-10-CM

## 2011-03-16 DIAGNOSIS — I10 Essential (primary) hypertension: Secondary | ICD-10-CM

## 2011-03-16 LAB — PACEMAKER DEVICE OBSERVATION
BRDY-0002RV: 65 {beats}/min
RV LEAD IMPEDENCE PM: 544 Ohm
RV LEAD THRESHOLD: 0.75 V

## 2011-03-16 NOTE — Progress Notes (Signed)
HPI Tonya Gardner returns today for followup. She is a 40 year old woman with a history of symptomatic bradycardia around the time of her pregnancy 9 years ago. After giving birth to a healthy child, she developed profound bradycardia with heart rates in the low 30s and high 20s. She underwent permanent pacemaker insertion. The patient suddenly developed full-blown eclampsia manifested by seizures and other neurologic problems. With treatment, these all resolved including her symptomatic bradycardia. The patient has now reached elective replacement of her pacemaker. She has paced only 0.1% of the time. She has had no syncope. She denies chest pain or shortness of breath. She is not inclined to have her pacemaker replaced though she admits that she is somewhat anxious about this. Allergies  Allergen Reactions  . Sulfonamide Derivatives     REACTION: Hives     Current Outpatient Prescriptions  Medication Sig Dispense Refill  . cetirizine (ZYRTEC) 10 MG tablet Take 10 mg by mouth as needed.           Past Medical History  Diagnosis Date  . CHF (congestive heart failure)   . Sinus bradycardia     ROS:   All systems reviewed and negative except as noted in the HPI.   Past Surgical History  Procedure Date  . Tonsillectomy   . Tetralogy of fallot repair   . Pacemaker insertion     Medtronic      No family history on file.   History   Social History  . Marital Status: Married    Spouse Name: N/A    Number of Children: N/A  . Years of Education: N/A   Occupational History  . Not on file.   Social History Main Topics  . Smoking status: Former Games developer  . Smokeless tobacco: Not on file  . Alcohol Use: Not on file  . Drug Use: Not on file  . Sexually Active: Not on file   Other Topics Concern  . Not on file   Social History Narrative  . No narrative on file     BP 110/62  Pulse 74  Resp 16  Ht 5\' 3"  (1.6 m)  Wt 189 lb (85.73 kg)  BMI 33.48 kg/m2  Physical  Exam:  Well appearing NAD HEENT: Unremarkable Neck:  No JVD, no thyromegally Lymphatics:  No adenopathy Back:  No CVA tenderness Lungs:  Clear. Well-healed pacemaker incision. HEART:  Regular rate rhythm, no murmurs, no rubs, no clicks Abd:  soft, positive bowel sounds, no organomegally, no rebound, no guarding Ext:  2 plus pulses, no edema, no cyanosis, no clubbing Skin:  No rashes no nodules Neuro:  CN II through XII intact, motor grossly intact  DEVICE  Device at ERI.  See PaceArt for details.   Assess/Plan:

## 2011-03-16 NOTE — Assessment & Plan Note (Signed)
Her device is at elective replacement indication. After considerable discussion we have elected not to replace her pacemaker. I have counseled her on the warning signs which would suggest that she was having symptomatic bradycardia and she states she will call us if these occur.

## 2011-03-16 NOTE — Patient Instructions (Signed)
Your physician recommends that you schedule a follow-up appointment as needed  

## 2011-03-16 NOTE — Assessment & Plan Note (Signed)
Her blood pressure remains well controlled. She will continue her low sodium diet. I have encouraged her to lose weight.

## 2011-05-20 LAB — POCT I-STAT, CHEM 8
BUN: 11
Calcium, Ion: 1.11 — ABNORMAL LOW
Chloride: 106
Creatinine, Ser: 0.7
Glucose, Bld: 96
HCT: 37
Hemoglobin: 12.6
Potassium: 4
Sodium: 139
TCO2: 23

## 2011-05-20 LAB — POCT PREGNANCY, URINE
Operator id: 257131
Preg Test, Ur: NEGATIVE

## 2011-08-05 ENCOUNTER — Emergency Department (HOSPITAL_COMMUNITY): Payer: Self-pay

## 2011-08-05 ENCOUNTER — Emergency Department (HOSPITAL_COMMUNITY)
Admission: EM | Admit: 2011-08-05 | Discharge: 2011-08-05 | Disposition: A | Discharge status: Home / Self Care | Payer: Self-pay | Attending: Emergency Medicine | Admitting: Emergency Medicine

## 2011-08-05 ENCOUNTER — Encounter (HOSPITAL_COMMUNITY): Payer: Self-pay | Admitting: Emergency Medicine

## 2011-08-05 DIAGNOSIS — R0602 Shortness of breath: Secondary | ICD-10-CM | POA: Insufficient documentation

## 2011-08-05 DIAGNOSIS — H9209 Otalgia, unspecified ear: Secondary | ICD-10-CM | POA: Insufficient documentation

## 2011-08-05 DIAGNOSIS — R51 Headache: Secondary | ICD-10-CM | POA: Insufficient documentation

## 2011-08-05 DIAGNOSIS — R059 Cough, unspecified: Secondary | ICD-10-CM | POA: Insufficient documentation

## 2011-08-05 DIAGNOSIS — J3489 Other specified disorders of nose and nasal sinuses: Secondary | ICD-10-CM | POA: Insufficient documentation

## 2011-08-05 DIAGNOSIS — R05 Cough: Secondary | ICD-10-CM | POA: Insufficient documentation

## 2011-08-05 DIAGNOSIS — R079 Chest pain, unspecified: Secondary | ICD-10-CM | POA: Insufficient documentation

## 2011-08-05 DIAGNOSIS — R011 Cardiac murmur, unspecified: Secondary | ICD-10-CM | POA: Insufficient documentation

## 2011-08-05 DIAGNOSIS — R6883 Chills (without fever): Secondary | ICD-10-CM | POA: Insufficient documentation

## 2011-08-05 DIAGNOSIS — J069 Acute upper respiratory infection, unspecified: Secondary | ICD-10-CM | POA: Insufficient documentation

## 2011-08-05 DIAGNOSIS — Z79899 Other long term (current) drug therapy: Secondary | ICD-10-CM | POA: Insufficient documentation

## 2011-08-05 DIAGNOSIS — I509 Heart failure, unspecified: Secondary | ICD-10-CM | POA: Insufficient documentation

## 2011-08-05 MED ORDER — PROMETHAZINE-CODEINE 6.25-10 MG/5ML PO SYRP: 5.0000 mL | ORAL_SOLUTION | Freq: Four times a day (QID) | ORAL | Status: AC | PRN

## 2011-08-05 MED ORDER — AZITHROMYCIN 250 MG PO TABS: ORAL_TABLET | ORAL | Status: DC

## 2011-08-05 MED ORDER — IPRATROPIUM BROMIDE 0.02 % IN SOLN
0.5000 mg | Freq: Once | RESPIRATORY_TRACT | Status: AC
  Administered 2011-08-05: 0.5 mg via RESPIRATORY_TRACT
  Filled 2011-08-05: qty 2.5

## 2011-08-05 MED ORDER — PREDNISONE 10 MG PO TABS: ORAL_TABLET | ORAL | Status: DC

## 2011-08-05 MED ORDER — ALBUTEROL SULFATE (5 MG/ML) 0.5% IN NEBU
2.5000 mg | INHALATION_SOLUTION | Freq: Once | RESPIRATORY_TRACT | Status: AC
  Administered 2011-08-05: 2.5 mg via RESPIRATORY_TRACT
  Filled 2011-08-05: qty 0.5

## 2011-08-05 MED ORDER — HYDROCOD POLST-CHLORPHEN POLST 10-8 MG/5ML PO LQCR
5.0000 mL | Freq: Once | ORAL | Status: AC
  Administered 2011-08-05: 5 mL via ORAL
  Filled 2011-08-05: qty 5

## 2011-08-05 NOTE — ED Notes (Addendum)
Pt c/o n/chills/fever/cough/congestion cp with coughing and deep breath.

## 2011-08-05 NOTE — ED Notes (Signed)
Resp paged for breathing treatment before pt is discharged.

## 2011-08-05 NOTE — ED Notes (Signed)
EDPa in to see pt for assessment. 

## 2011-08-05 NOTE — ED Provider Notes (Signed)
History     CSN: [id number] Arrival date & time: 08/05/2011  4:22 PM   First MD Initiated Contact with Patient 08/05/11 1628      Chief Complaint  Patient presents with  . Cough    (Consider location/radiation/quality/duration/timing/severity/associated sxs/prior treatment) Patient is a 40 y.o. female presenting with cough. The history is provided by the patient.  Cough The current episode started more than 2 days ago. The problem occurs hourly. The problem has been gradually worsening. The cough is productive of sputum. There has been no fever. Associated symptoms include chills, ear pain, headaches, rhinorrhea and shortness of breath. Pertinent negatives include no chest pain and no wheezing. She has tried decongestants for the symptoms. The treatment provided no relief. She is not a smoker. Her past medical history is significant for bronchitis. Her past medical history does not include pneumonia, COPD or asthma.    Past Medical History  Diagnosis Date  . CHF (congestive heart failure)   . Sinus bradycardia     Past Surgical History  Procedure Date  . Tonsillectomy   . Tetralogy of fallot repair   . Pacemaker insertion     [employer]     No family history on file.  History  Substance Use Topics  . Smoking status: Former Games developer  . Smokeless tobacco: Not on file  . Alcohol Use: No    OB History    Grav Para Term Preterm Abortions TAB SAB Ect Mult Living                  Review of Systems  Constitutional: Positive for chills. Negative for activity change.       All ROS Neg except as noted in HPI  HENT: Positive for ear pain and rhinorrhea. Negative for nosebleeds and neck pain.   Eyes: Negative for photophobia and discharge.  Respiratory: Positive for cough and shortness of breath. Negative for wheezing.   Cardiovascular: Negative for chest pain and palpitations.  Gastrointestinal: Negative for abdominal pain and blood in stool.  Genitourinary: Negative for  dysuria, frequency and hematuria.  Musculoskeletal: Negative for back pain and arthralgias.  Skin: Negative.   Neurological: Positive for headaches. Negative for dizziness, seizures and speech difficulty.  Psychiatric/Behavioral: Negative for hallucinations and confusion.    Allergies  Sulfonamide derivatives and Latex  Home Medications   Current Outpatient Rx  Name Route Sig Dispense Refill  . CETIRIZINE HCL 10 MG PO TABS Oral Take 10 mg by mouth daily as needed. For allergies    . PHENYLEPHRINE-DM-GG-APAP 5-10-200-325 MG/10ML PO LIQD Oral Take 10 mLs by mouth 2 (two) times daily as needed. For cold symptoms     . ROBITUSSIN COLD & COUGH PO Oral Take 10 mLs by mouth 2 (two) times daily as needed. For cold symptoms       BP 109/77  Pulse 80  Temp(Src) 97.9 F (36.6 C) (Oral)  Resp 16  Ht 5\' 2"  (1.575 m)  Wt 180 lb (81.647 kg)  BMI 32.92 kg/m2  SpO2 100%  LMP 07/08/2011  Physical Exam  Nursing note and vitals reviewed. Constitutional: She is oriented to person, place, and time. She appears well-developed and well-nourished.  Non-toxic appearance.  HENT:  Head: Normocephalic.  Right Ear: Tympanic membrane and external ear normal.  Left Ear: Tympanic membrane and external ear normal.       Increase mucous in the back of the throat.  Eyes: EOM and lids are normal. Pupils are equal, round, and reactive to light.  Neck: Normal range of motion. Neck supple. Carotid bruit is not present.  Cardiovascular: Normal rate, regular rhythm, intact distal pulses and normal pulses.   Murmur heard. Pulmonary/Chest: Breath sounds normal. No respiratory distress.       Course breath sounds with few scattered rhonchi.  Abdominal: Soft. Bowel sounds are normal. There is no tenderness. There is no guarding.  Musculoskeletal: Normal range of motion.  Lymphadenopathy:       Head (right side): No submandibular adenopathy present.       Head (left side): No submandibular adenopathy present.     She has no cervical adenopathy.  Neurological: She is alert and oriented to person, place, and time. She has normal strength. No cranial nerve deficit or sensory deficit.  Skin: Skin is warm and dry.  Psychiatric: She has a normal mood and affect. Her speech is normal.    ED Course  Procedures (including critical care time)  Labs Reviewed - No data to display No results found.   No diagnosis found.    MDM  I have reviewed nursing notes, vital signs, and all appropriate lab and imaging results for this patient. Results for orders placed in visit on 03/16/11  PACEMAKER DEVICE OBSERVATION      Component Value Range   DEVICE MODEL PM [id number]     [id number] Lewayne Bunting   M.D.     [id number] Lewayne Bunting   M.D.     [facility] NOTES PM       Value: Device reached ERI on 02-26-11. Device reverted to VVI to 65. Plan per GT.   VENTRICULAR PACING PM 0.4     BATTERY VOLTAGE 2.59     RV LEAD IMPEDENCE PM 544     RV LEAD AMPLITUDE 22.4     RV LEAD THRESHOLD 0.75     BRDY-0001RV VVI     BRDY-0002RV 65     BRDY-0005RV 0/0     BRDY-0009RV No     BMOD-0001RV Medium/Low     BMOD-0003RV 30     BMOD-0004RV Exercise     BMOD-0005RV 95     No results found.        Kathie Dike, [place] 08/10/11 [phone]

## 2011-08-08 NOTE — ED Provider Notes (Signed)
History     CSN: [id number] Arrival date & time: 08/05/2011  4:22 PM   First MD Initiated Contact with Patient 08/05/11 1628      Chief Complaint  Patient presents with  . Cough    (Consider location/radiation/quality/duration/timing/severity/associated sxs/prior treatment) HPI  Past Medical History  Diagnosis Date  . CHF (congestive heart failure)   . Sinus bradycardia     Past Surgical History  Procedure Date  . Tonsillectomy   . Tetralogy of fallot repair   . Pacemaker insertion     [employer]     No family history on file.  History  Substance Use Topics  . Smoking status: Former Games developer  . Smokeless tobacco: Not on file  . Alcohol Use: No    OB History    Grav Para Term Preterm Abortions TAB SAB Ect Mult Living                  Review of Systems  Allergies  Sulfonamide derivatives and Latex  Home Medications   Current Outpatient Rx  Name Route Sig Dispense Refill  . CETIRIZINE HCL 10 MG PO TABS Oral Take 10 mg by mouth daily as needed. For allergies    . PHENYLEPHRINE-DM-GG-APAP 5-10-200-325 MG/10ML PO LIQD Oral Take 10 mLs by mouth 2 (two) times daily as needed. For cold symptoms     . ROBITUSSIN COLD & COUGH PO Oral Take 10 mLs by mouth 2 (two) times daily as needed. For cold symptoms     . AZITHROMYCIN 250 MG PO TABS  2 tabs day one, then one daily until all taken. 6 tablet 0  . PREDNISONE 10 MG PO TABS  6,5,4,3,2,1 - take with food 21 tablet 0  . PROMETHAZINE-CODEINE 6.25-10 MG/5ML PO SYRP Oral Take 5 mLs by mouth every 6 (six) hours as needed for cough. 120 mL 0    BP 109/77  Pulse 80  Temp(Src) 97.9 F (36.6 C) (Oral)  Resp 16  Ht 5\' 2"  (1.575 m)  Wt 180 lb (81.647 kg)  BMI 32.92 kg/m2  SpO2 100%  LMP 07/08/2011  Physical Exam  ED Course  Procedures (including critical care time)  Labs Reviewed - No data to display No results found.   1. URI (upper respiratory infection)       MDM        Medical screening  examination/treatment/procedure(s) were performed by non-physician practitioner and as supervising physician I was immediately available for consultation/collaboration.  Donnetta Hutching, MD 08/08/11 1630

## 2011-08-10 NOTE — ED Provider Notes (Signed)
Medical screening examination/treatment/procedure(s) were performed by non-physician practitioner and as supervising physician I was immediately available for consultation/collaboration.  Donnetta Hutching, MD 08/10/11 [phone]

## 2011-12-08 ENCOUNTER — Other Ambulatory Visit: Payer: Self-pay | Admitting: Obstetrics and Gynecology

## 2013-06-23 ENCOUNTER — Encounter (HOSPITAL_COMMUNITY): Payer: Self-pay | Admitting: Emergency Medicine

## 2013-06-23 ENCOUNTER — Emergency Department (HOSPITAL_COMMUNITY): Payer: Self-pay

## 2013-06-23 ENCOUNTER — Emergency Department (HOSPITAL_COMMUNITY)
Admission: EM | Admit: 2013-06-23 | Discharge: 2013-06-23 | Disposition: A | Discharge status: Home / Self Care | Payer: Self-pay | Attending: Emergency Medicine | Admitting: Emergency Medicine

## 2013-06-23 DIAGNOSIS — R509 Fever, unspecified: Secondary | ICD-10-CM | POA: Insufficient documentation

## 2013-06-23 DIAGNOSIS — Z9889 Other specified postprocedural states: Secondary | ICD-10-CM | POA: Insufficient documentation

## 2013-06-23 DIAGNOSIS — Z91040 Latex allergy status: Secondary | ICD-10-CM | POA: Insufficient documentation

## 2013-06-23 DIAGNOSIS — Z950 Presence of cardiac pacemaker: Secondary | ICD-10-CM | POA: Insufficient documentation

## 2013-06-23 DIAGNOSIS — R42 Dizziness and giddiness: Secondary | ICD-10-CM | POA: Insufficient documentation

## 2013-06-23 DIAGNOSIS — IMO0001 Reserved for inherently not codable concepts without codable children: Secondary | ICD-10-CM | POA: Insufficient documentation

## 2013-06-23 DIAGNOSIS — I509 Heart failure, unspecified: Secondary | ICD-10-CM | POA: Insufficient documentation

## 2013-06-23 DIAGNOSIS — Z87891 Personal history of nicotine dependence: Secondary | ICD-10-CM | POA: Insufficient documentation

## 2013-06-23 DIAGNOSIS — H9209 Otalgia, unspecified ear: Secondary | ICD-10-CM | POA: Insufficient documentation

## 2013-06-23 DIAGNOSIS — J209 Acute bronchitis, unspecified: Secondary | ICD-10-CM | POA: Insufficient documentation

## 2013-06-23 DIAGNOSIS — J029 Acute pharyngitis, unspecified: Secondary | ICD-10-CM | POA: Insufficient documentation

## 2013-06-23 MED ORDER — AZITHROMYCIN 250 MG PO TABS: ORAL_TABLET | ORAL | Status: DC

## 2013-06-23 MED ORDER — ALBUTEROL SULFATE HFA 108 (90 BASE) MCG/ACT IN AERS
2.0000 | INHALATION_SPRAY | RESPIRATORY_TRACT | Status: DC | PRN
  Administered 2013-06-23: 2 via RESPIRATORY_TRACT
  Filled 2013-06-23: qty 6.7

## 2013-06-23 MED ORDER — ALBUTEROL SULFATE (5 MG/ML) 0.5% IN NEBU
2.5000 mg | INHALATION_SOLUTION | Freq: Once | RESPIRATORY_TRACT | Status: AC
  Administered 2013-06-23: 2.5 mg via RESPIRATORY_TRACT
  Filled 2013-06-23: qty 0.5

## 2013-06-23 MED ORDER — IPRATROPIUM BROMIDE 0.02 % IN SOLN
0.5000 mg | Freq: Once | RESPIRATORY_TRACT | Status: AC
  Administered 2013-06-23: 0.5 mg via RESPIRATORY_TRACT
  Filled 2013-06-23: qty 2.5

## 2013-06-23 MED ORDER — PREDNISONE 50 MG PO TABS
60.0000 mg | ORAL_TABLET | Freq: Once | ORAL | Status: AC
  Administered 2013-06-23: 60 mg via ORAL
  Filled 2013-06-23 (×2): qty 1

## 2013-06-23 MED ORDER — GUAIFENESIN-CODEINE 100-10 MG/5ML PO SOLN
5.0000 mL | Freq: Once | ORAL | Status: AC
  Administered 2013-06-23: 5 mL via ORAL
  Filled 2013-06-23: qty 5

## 2013-06-23 MED ORDER — PREDNISONE (PAK) 10 MG PO TABS: 10.0000 mg | ORAL_TABLET | Freq: Every day | ORAL | Status: DC

## 2013-06-23 MED ORDER — PHENYLEPH-PROMETHAZINE-COD 5-6.25-10 MG/5ML PO SYRP: 5.0000 mL | ORAL_SOLUTION | ORAL | Status: DC | PRN

## 2013-06-23 NOTE — ED Notes (Signed)
Pt reports cough for 2 weeks, started coughing up blood and green mucus this am.  Chest feels heavy since this am , right ear is "backed up" and she is dizzy this am.  Low grade fever off/on at times, has been dealing with the cough, but "things have changed this am"

## 2013-06-23 NOTE — ED Notes (Signed)
While discussing DC teaching pt mentioned she thought she was to receive an inhaler. Spoke with EDP and received a new order for said medication. Pt has had inhalers in the past and verbalized knowledge of proper use.

## 2013-06-23 NOTE — ED Provider Notes (Signed)
CSN: [id number]     Arrival date & time 06/23/13  4098 History   First MD Initiated Contact with Patient 06/23/13 [phone]     Chief Complaint  Patient presents with  . Cough  . Dizziness   (Consider location/radiation/quality/duration/timing/severity/associated sxs/prior Treatment) Patient is a 42 y.o. female presenting with cough. The history is provided by the patient.  Cough Cough characteristics:  Productive Sputum characteristics:  Green and bloody Severity:  Moderate Onset quality:  Gradual Duration:  2 weeks Timing:  Sporadic Progression:  Worsening Chronicity:  New Smoker: no   Relieved by:  Nothing Worsened by:  Deep breathing and lying down Associated symptoms: chills, ear fullness, ear pain, fever, myalgias, sinus congestion and sore throat   Associated symptoms: no headaches and no rash    Tonya Gardner is a 42 y.o. female who presents to the ED with cough cold and congestion that has been going on for 2 weeks. She has tried OTC cough medication, decongestants and other medications without relief. The symptoms have gotten worse and her chest hurts from coughing. She has started to cough up blood tinged yellow sputum. This morning her right ear hurts and feels stopped up and she felt dizzy. Low grade fever off and on. Has same thing last year about this time.  Past Medical History  Diagnosis Date  . CHF (congestive heart failure)   . Sinus bradycardia    Past Surgical History  Procedure Laterality Date  . Tonsillectomy    . Tetralogy of fallot repair    . Pacemaker insertion      [employer]    No family history on file. History  Substance Use Topics  . Smoking status: Former Games developer  . Smokeless tobacco: Not on file  . Alcohol Use: No   OB History   Grav Para Term Preterm Abortions TAB SAB Ect Mult Living                 Review of Systems  Constitutional: Positive for fever and chills.  HENT: Positive for congestion, ear pain and sore throat.   Eyes:  Negative for pain and redness.  Respiratory: Positive for cough.   Gastrointestinal: Negative for nausea, vomiting and abdominal pain.  Musculoskeletal: Positive for myalgias.  Skin: Negative for rash.  Neurological: Positive for dizziness. Negative for speech difficulty and headaches.  Psychiatric/Behavioral: Negative for confusion. The patient is not nervous/anxious.     Allergies  Sulfonamide derivatives and Latex  Home Medications   Current Outpatient Rx  Name  Route  Sig  Dispense  Refill  . azithromycin (ZITHROMAX) 250 MG tablet      2 tabs day one, then one daily until all taken.   6 tablet   0   . cetirizine (ZYRTEC) 10 MG tablet   Oral   Take 10 mg by mouth daily as needed. For allergies         . Phenylephrine-DM-GG-APAP (MUCINEX FAST-MAX COLD FLU) 5-10-200-325 MG/10ML LIQD   Oral   Take 10 mLs by mouth 2 (two) times daily as needed. For cold symptoms          . predniSONE (DELTASONE) 10 MG tablet      6,5,4,3,2,1 - take with food   21 tablet   0   . Pseudoephedrine-DM-GG (ROBITUSSIN COLD & COUGH PO)   Oral   Take 10 mLs by mouth 2 (two) times daily as needed. For cold symptoms           BP 113/53  Pulse 68  Temp(Src) 97.5 F (36.4 C) (Oral)  Resp 18  Ht 5\' 2"  (1.575 m)  Wt 190 lb (86.183 kg)  BMI 34.74 kg/m2  SpO2 100%  LMP 06/01/2013 Physical Exam  Nursing note and vitals reviewed. Constitutional: She is oriented to person, place, and time. She appears well-developed and well-nourished. No distress.  HENT:  Head: Normocephalic and atraumatic.  Right Ear: Tympanic membrane is retracted.  Left Ear: Tympanic membrane normal.  Nose: Rhinorrhea present.  Mouth/Throat: Uvula is midline, oropharynx is clear and moist and mucous membranes are normal.  Eyes: EOM are normal.  Neck: Neck supple.  Cardiovascular: Normal rate, regular rhythm and normal heart sounds.   Pulmonary/Chest: Effort normal. She has decreased breath sounds.  Abdominal:  Soft. There is no tenderness.  Musculoskeletal: Normal range of motion.  Neurological: She is alert and oriented to person, place, and time. No cranial nerve deficit.  Skin: Skin is warm and dry.  Psychiatric: She has a normal mood and affect. Her behavior is normal.   Dg Chest 2 View  06/23/2013   CLINICAL DATA:  Initial encounter for current 2 week episode of cough, including hemoptysis earlier today. Indwelling pacemaker. Prior history of CHF. Current history of hypertension.  EXAM: CHEST  2 VIEW  COMPARISON:  Portable chest x-ray 03/02/2008 [facility] and two-view chest x-ray 07/21/2006 [facility].  FINDINGS: Cardiac silhouette mildly enlarged but stable. Left subclavian dual lead transvenous pacemaker unchanged, intact, with the leads at the expected location of the right atrial appendage and right ventricular apex. Hilar and mediastinal contours otherwise unremarkable. Lungs clear. Bronchovascular markings normal. Pulmonary vascularity normal. No visible pleural effusions. No pneumothorax. Mild degenerative changes involving the thoracic spine.  IMPRESSION: Stable mild cardiomegaly. No acute cardiopulmonary disease.   Electronically Signed   By: Hulan Saas M.D.   On: 06/23/2013 10:51    ED Course Patient improved after neb treatment, cough medication and prednisone  Procedures  EKG Interpretation   None       MDM  42 y.o. female with bronchitis with bronchospasm. Will continue treatment with prednisone, Z-Pak, albuterol inhaler and Phenergan Exp with Codeine. She is stable for discharge home without any immediate complications. VS normal and O2 Sat 99% on R/A BP 113/53  Pulse 68  Temp(Src) 97.5 F (36.4 C) (Oral)  Resp 18  Ht 5\' 2"  (1.575 m)  Wt 190 lb (86.183 kg)  BMI 34.74 kg/m2  SpO2 99%  LMP 06/01/2013  Discussed with the patient clinical and x-ray findings and plan of care. All questioned fully answered. She will return if any problems arise.     Medication List    TAKE these medications       azithromycin 250 MG tablet  Commonly known as:  ZITHROMAX  Take first 2 tablets together, then 1 every day until finished.     Phenyleph-Promethazine-Cod 5-6.25-10 MG/5ML Syrp  Take 5 mLs by mouth every 4 (four) hours as needed.     predniSONE 10 MG tablet  Commonly known as:  STERAPRED UNI-PAK  Take 1 tablet (10 mg total) by mouth daily. Starting 06/24/2013 take 5 tablets PO then 4, 3, 2, 1      ASK your doctor about these medications       cetirizine 10 MG tablet  Commonly known as:  ZYRTEC  Take 10 mg by mouth daily as needed. For allergies     ibuprofen 200 MG tablet  Commonly known as:  ADVIL,MOTRIN  Take 400  mg by mouth every 6 (six) hours as needed for pain.     MUCINEX FAST-MAX COLD FLU 5-10-200-325 MG/10ML Liqd  Generic drug:  Phenylephrine-DM-GG-APAP  Take 10 mLs by mouth 2 (two) times daily as needed. For cold symptoms     ROBITUSSIN COLD & COUGH PO  Take 10 mLs by mouth 2 (two) times daily as needed. For cold symptoms           Janne Napoleon, NP 06/23/13 [phone]

## 2013-06-24 NOTE — ED Provider Notes (Signed)
History/physical exam/procedure(s) were performed by non-physician practitioner and as supervising physician I was immediately available for consultation/collaboration. I have reviewed all notes and am in agreement with care and plan.   Hilario Quarry, MD 06/24/13 1600

## 2013-12-04 ENCOUNTER — Encounter (HOSPITAL_COMMUNITY): Payer: Self-pay | Admitting: Emergency Medicine

## 2013-12-04 ENCOUNTER — Emergency Department (HOSPITAL_COMMUNITY)
Admission: EM | Admit: 2013-12-04 | Discharge: 2013-12-04 | Disposition: A | Discharge status: Home / Self Care | Payer: BC Managed Care – PPO | Attending: Emergency Medicine | Admitting: Emergency Medicine

## 2013-12-04 ENCOUNTER — Emergency Department (HOSPITAL_COMMUNITY): Payer: BC Managed Care – PPO

## 2013-12-04 DIAGNOSIS — Z950 Presence of cardiac pacemaker: Secondary | ICD-10-CM | POA: Insufficient documentation

## 2013-12-04 DIAGNOSIS — S81811A Laceration without foreign body, right lower leg, initial encounter: Secondary | ICD-10-CM

## 2013-12-04 DIAGNOSIS — W540XXA Bitten by dog, initial encounter: Secondary | ICD-10-CM | POA: Insufficient documentation

## 2013-12-04 DIAGNOSIS — Y9389 Activity, other specified: Secondary | ICD-10-CM | POA: Insufficient documentation

## 2013-12-04 DIAGNOSIS — Y92009 Unspecified place in unspecified non-institutional (private) residence as the place of occurrence of the external cause: Secondary | ICD-10-CM | POA: Insufficient documentation

## 2013-12-04 DIAGNOSIS — S81809A Unspecified open wound, unspecified lower leg, initial encounter: Principal | ICD-10-CM

## 2013-12-04 DIAGNOSIS — I509 Heart failure, unspecified: Secondary | ICD-10-CM | POA: Insufficient documentation

## 2013-12-04 DIAGNOSIS — S81009A Unspecified open wound, unspecified knee, initial encounter: Secondary | ICD-10-CM | POA: Insufficient documentation

## 2013-12-04 DIAGNOSIS — S91009A Unspecified open wound, unspecified ankle, initial encounter: Principal | ICD-10-CM

## 2013-12-04 DIAGNOSIS — Z87891 Personal history of nicotine dependence: Secondary | ICD-10-CM | POA: Insufficient documentation

## 2013-12-04 DIAGNOSIS — Z23 Encounter for immunization: Secondary | ICD-10-CM | POA: Insufficient documentation

## 2013-12-04 DIAGNOSIS — T148XXA Other injury of unspecified body region, initial encounter: Secondary | ICD-10-CM

## 2013-12-04 MED ORDER — OXYCODONE-ACETAMINOPHEN 5-325 MG PO TABS
2.0000 | ORAL_TABLET | Freq: Once | ORAL | Status: AC
  Administered 2013-12-04: 2 via ORAL
  Filled 2013-12-04: qty 2

## 2013-12-04 MED ORDER — AMOXICILLIN-POT CLAVULANATE 875-125 MG PO TABS: 1.0000 | ORAL_TABLET | Freq: Two times a day (BID) | ORAL | Status: DC

## 2013-12-04 MED ORDER — ONDANSETRON 8 MG PO TBDP
8.0000 mg | ORAL_TABLET | Freq: Once | ORAL | Status: AC
  Administered 2013-12-04: 8 mg via ORAL
  Filled 2013-12-04: qty 1

## 2013-12-04 MED ORDER — ONDANSETRON HCL 8 MG PO TABS: 8.0000 mg | ORAL_TABLET | Freq: Three times a day (TID) | ORAL | Status: DC | PRN

## 2013-12-04 MED ORDER — LIDOCAINE HCL (PF) 1 % IJ SOLN
10.0000 mL | Freq: Once | INTRAMUSCULAR | Status: AC
  Administered 2013-12-04: 10 mL
  Filled 2013-12-04: qty 10

## 2013-12-04 MED ORDER — AMOXICILLIN-POT CLAVULANATE 875-125 MG PO TABS
1.0000 | ORAL_TABLET | Freq: Once | ORAL | Status: AC
  Administered 2013-12-04: 1 via ORAL
  Filled 2013-12-04: qty 1

## 2013-12-04 MED ORDER — LIDOCAINE HCL (PF) 1 % IJ SOLN
INTRAMUSCULAR | Status: AC
  Administered 2013-12-04: 23:00:00
  Filled 2013-12-04: qty 5

## 2013-12-04 MED ORDER — TETANUS-DIPHTH-ACELL PERTUSSIS 5-2.5-18.5 LF-MCG/0.5 IM SUSP
0.5000 mL | Freq: Once | INTRAMUSCULAR | Status: AC
  Administered 2013-12-04: 0.5 mL via INTRAMUSCULAR

## 2013-12-04 MED ORDER — OXYCODONE-ACETAMINOPHEN 5-325 MG PO TABS: 1.0000 | ORAL_TABLET | ORAL | Status: DC | PRN

## 2013-12-04 NOTE — ED Notes (Addendum)
Patient states she was bitten by her own dog. Lacerations noted to lower right leg. Bleeding controlled at this time. Patient states dog is up to date with rabies vaccination.

## 2013-12-04 NOTE — ED Notes (Signed)
Deputy here to take report.

## 2013-12-04 NOTE — Discharge Instructions (Signed)
Animal Bite °An animal bite can result in a scratch on the skin, deep open cut, puncture of the skin, crush injury, or tearing away of the skin or a body part. Dogs are responsible for most animal bites. Children are bitten more often than adults. An animal bite can range from very mild to more serious. A small bite from your house pet is no cause for alarm. However, some animal bites can become infected or injure a bone or other tissue. You must seek medical care if: °· The skin is broken and bleeding does not slow down or stop after 15 minutes. °· The puncture is deep and difficult to clean (such as a cat bite). °· Pain, warmth, redness, or pus develops around the wound. °· The bite is from a stray animal or rodent. There may be a risk of rabies infection. °· The bite is from a snake, raccoon, skunk, fox, coyote, or bat. There may be a risk of rabies infection. °· The person bitten has a chronic illness such as diabetes, liver disease, or cancer, or the person takes medicine that lowers the immune system. °· There is concern about the location and severity of the bite. °It is important to clean and protect an animal bite wound right away to prevent infection. Follow these steps: °· Clean the wound with plenty of water and soap. °· Apply an antibiotic cream. °· Apply gentle pressure over the wound with a clean towel or gauze to slow or stop bleeding. °· Elevate the affected area above the heart to help stop any bleeding. °· Seek medical care. Getting medical care within 8 hours of the animal bite leads to the best possible outcome. °DIAGNOSIS  °Your caregiver will most likely: °· Take a detailed history of the animal and the bite injury. °· Perform a wound exam. °· Take your medical history. °Blood tests or X-rays may be performed. Sometimes, infected bite wounds are cultured and sent to a lab to identify the infectious bacteria.  °TREATMENT  °Medical treatment will depend on the location and type of animal bite as  well as the patient's medical history. Treatment may include: °· Wound care, such as cleaning and flushing the wound with saline solution, bandaging, and elevating the affected area. °· Antibiotics. °· Tetanus immunization. °· Rabies immunization. °· Leaving the wound open to heal. This is often done with animal bites, due to the high risk of infection. However, in certain cases, wound closure with stitches, wound adhesive, skin adhesive strips, or staples may be used. ° Infected bites that are left untreated may require intravenous (IV) antibiotics and surgical treatment in the hospital. °HOME CARE INSTRUCTIONS °· Follow your caregiver's instructions for wound care. °· Take all medicines as directed. °· If your caregiver prescribes antibiotics, take them as directed. Finish them even if you start to feel better. °· Follow up with your caregiver for further exams or immunizations as directed. °You may need a tetanus shot if: °· You cannot remember when you had your last tetanus shot. °· You have never had a tetanus shot. °· The injury broke your skin. °If you get a tetanus shot, your arm may swell, get red, and feel warm to the touch. This is common and not a problem. If you need a tetanus shot and you choose not to have one, there is a rare chance of getting tetanus. Sickness from tetanus can be serious. °SEEK MEDICAL CARE IF: °· You notice warmth, redness, soreness, swelling, pus discharge, or a bad   smell coming from the wound. °· You have a red line on the skin coming from the wound. °· You have a fever, chills, or a general ill feeling. °· You have nausea or vomiting. °· You have continued or worsening pain. °· You have trouble moving the injured part. °· You have other questions or concerns. °MAKE SURE YOU: °· Understand these instructions. °· Will watch your condition. °· Will get help right away if you are not doing well or get worse. °Document Released: 04/27/2011 Document Revised: 11/01/2011 Document  Reviewed: 04/27/2011 °[employer]® Patient Information ©2014 [employer]. ° °Laceration Care, Adult °A laceration is a cut or lesion that goes through all layers of the skin and into the tissue just beneath the skin. °TREATMENT  °Some lacerations may not require closure. Some lacerations may not be able to be closed due to an increased risk of infection. It is important to see your caregiver as soon as possible after an injury to minimize the risk of infection and maximize the opportunity for successful closure. °If closure is appropriate, pain medicines may be given, if needed. The wound will be cleaned to help prevent infection. Your caregiver will use stitches (sutures), staples, wound glue (adhesive), or skin adhesive strips to repair the laceration. These tools bring the skin edges together to allow for faster healing and a better cosmetic outcome. However, all wounds will heal with a scar. Once the wound has healed, scarring can be minimized by covering the wound with sunscreen during the day for 1 full year. °HOME CARE INSTRUCTIONS  °For sutures or staples: °· Keep the wound clean and dry. °· If you were given a bandage (dressing), you should change it at least once a day. Also, change the dressing if it becomes wet or dirty, or as directed by your caregiver. °· Wash the wound with soap and water 2 times a day. Rinse the wound off with water to remove all soap. Pat the wound dry with a clean towel. °· After cleaning, apply a thin layer of the antibiotic ointment as recommended by your caregiver. This will help prevent infection and keep the dressing from sticking. °· You may shower as usual after the first 24 hours. Do not soak the wound in water until the sutures are removed. °· Only take over-the-counter or prescription medicines for pain, discomfort, or fever as directed by your caregiver. °· Get your sutures or staples removed as directed by your caregiver. °For skin adhesive strips: °· Keep the wound  clean and dry. °· Do not get the skin adhesive strips wet. You may bathe carefully, using caution to keep the wound dry. °· If the wound gets wet, pat it dry with a clean towel. °· Skin adhesive strips will fall off on their own. You may trim the strips as the wound heals. Do not remove skin adhesive strips that are still stuck to the wound. They will fall off in time. °For wound adhesive: °· You may briefly wet your wound in the shower or bath. Do not soak or scrub the wound. Do not swim. Avoid periods of heavy perspiration until the skin adhesive has fallen off on its own. After showering or bathing, gently pat the wound dry with a clean towel. °· Do not apply liquid medicine, cream medicine, or ointment medicine to your wound while the skin adhesive is in place. This may loosen the film before your wound is healed. °· If a dressing is placed over the wound, be careful not to   apply tape directly over the skin adhesive. This may cause the adhesive to be pulled off before the wound is healed.  Avoid prolonged exposure to sunlight or tanning lamps while the skin adhesive is in place. Exposure to ultraviolet light in the first year will darken the scar.  The skin adhesive will usually remain in place for 5 to 10 days, then naturally fall off the skin. Do not pick at the adhesive film. You may need a tetanus shot if:  You cannot remember when you had your last tetanus shot.  You have never had a tetanus shot. If you get a tetanus shot, your arm may swell, get red, and feel warm to the touch. This is common and not a problem. If you need a tetanus shot and you choose not to have one, there is a rare chance of getting tetanus. Sickness from tetanus can be serious. SEEK MEDICAL CARE IF:   You have redness, swelling, or increasing pain in the wound.  You see a red line that goes away from the wound.  You have yellowish-white fluid (pus) coming from the wound.  You have a fever.  You notice a bad smell  coming from the wound or dressing.  Your wound breaks open before or after sutures have been removed.  You notice something coming out of the wound such as wood or glass.  Your wound is on your hand or foot and you cannot move a finger or toe. SEEK IMMEDIATE MEDICAL CARE IF:   Your pain is not controlled with prescribed medicine.  You have severe swelling around the wound causing pain and numbness or a change in color in your arm, hand, leg, or foot.  Your wound splits open and starts bleeding.  You have worsening numbness, weakness, or loss of function of any joint around or beyond the wound.  You develop painful lumps near the wound or on the skin anywhere on your body. MAKE SURE YOU:   Understand these instructions.  Will watch your condition.  Will get help right away if you are not doing well or get worse. Document Released: 08/09/2005 Document Revised: 11/01/2011 Document Reviewed: 02/02/2011 [facility] Patient Information 2014 [place], [place].   Have your sutures removed in 10 days.  Keep your wounds clean and dry,  Until a good scab forms - you may then wash gently twice daily with mild soap and water, but dry completely after.  Get rechecked for any sign of infection (redness,  Swelling,  Increased pain or drainage of purulent fluid).

## 2013-12-04 NOTE — ED Notes (Addendum)
Dog bite to rt lower leg, Pts dogs were fighting over food.  1 puncture to lt lower leg.  The dogs belong to the pt and she says they are healthy and utd on rabies vacc.  Sheriffs dept - animal control notified.  - Pt address is [address]  2"lac to rt lower leg,and 1 " lac to medial rt lower leg. Puncture to calf of rt lower leg.

## 2013-12-05 NOTE — ED Provider Notes (Signed)
CSN: [id number]     Arrival date & time 12/04/13  2017 History   First MD Initiated Contact with Patient 12/04/13 2118     Chief Complaint  Patient presents with  . Animal Bite     (Consider location/radiation/quality/duration/timing/severity/associated sxs/prior Treatment) Patient is a 43 y.o. female presenting with animal bite. The history is provided by the patient.  Animal Bite Contact animal:  Dog Time since incident:  30 minutes Pain details:    Quality:  Aching   Severity:  Moderate   Timing:  Constant   Progression:  Unchanged Incident location:  Home Provoked: Patient attempted to stop her dogs from fighting over their food bowls.    Notifications:  Animal control Animal's rabies vaccination status:  Up to date Animal in possession: yes   Tetanus status:  Out of date Relieved by:  Cold compresses Worsened by:  Activity Ineffective treatments:  None tried Associated symptoms: no fever, no numbness and no swelling     Past Medical History  Diagnosis Date  . CHF (congestive heart failure)   . Sinus bradycardia    Past Surgical History  Procedure Laterality Date  . Tonsillectomy    . Tetralogy of fallot repair    . Pacemaker insertion      [employer]   . Cesarean section     History reviewed. No pertinent family history. History  Substance Use Topics  . Smoking status: Former Games developermoker  . Smokeless tobacco: Not on file  . Alcohol Use: Yes     Comment: occasionally   OB History   Grav Para Term Preterm Abortions TAB SAB Ect Mult Living                 Review of Systems  Constitutional: Negative for fever and chills.  HENT: Negative for facial swelling.   Respiratory: Negative for shortness of breath.   Cardiovascular: Negative for chest pain.  Skin: Positive for wound.  Neurological: Negative for numbness.      Allergies  Sulfonamide derivatives and Latex  Home Medications   Prior to Admission medications   Medication Sig Start Date End Date  Taking? Authorizing Provider  cetirizine (ZYRTEC) 10 MG tablet Take 10 mg by mouth daily as needed. For allergies   Yes Historical Provider, MD  naproxen sodium (ALEVE) 220 MG tablet Take 220 mg by mouth daily as needed (for pain).   Yes Historical Provider, MD  amoxicillin-clavulanate (AUGMENTIN) 875-125 MG per tablet Take 1 tablet by mouth every 12 (twelve) hours. 12/04/13   Burgess AmorJulie Jamillah Camilo, PA-C  ondansetron (ZOFRAN) 8 MG tablet Take 1 tablet (8 mg total) by mouth every 8 (eight) hours as needed for nausea or vomiting. 12/04/13   Burgess AmorJulie Dontae Minerva, PA-C  oxyCODONE-acetaminophen (PERCOCET/ROXICET) 5-325 MG per tablet Take 1 tablet by mouth every 4 (four) hours as needed for severe pain. 12/04/13   Burgess AmorJulie Thedore Pickel, PA-C   BP 126/79  Pulse 77  Temp(Src) 97.8 F (36.6 C) (Oral)  Resp 16  Ht 5\' 3"  (1.6 m)  Wt 185 lb (83.915 kg)  BMI 32.78 kg/m2  SpO2 98%  LMP 11/20/2013 Physical Exam  Constitutional: She is oriented to person, place, and time. She appears well-developed and well-nourished.  HENT:  Head: Normocephalic.  Cardiovascular: Normal rate.   Pulmonary/Chest: Effort normal.  Musculoskeletal: She exhibits tenderness.  Neurological: She is alert and oriented to person, place, and time. No sensory deficit.  Skin: Laceration noted.  Multiple puncture wounds on lower legs,  One on left lateral leg,  slow bleed, 3 on right lower leg, medial and lateral which are hemostatic.  4 cm tear mid medial right leg which is subcutaneous with 2 parallel abrasions below this wound.  There is a 3 cm gaping tear below this,  Also subcutaneous, hemostatic.  2 cm laceration and 1 cm lacerations right lateral lower leg,  All gaping wounds.    ED Course  Procedures (including critical care time)   LACERATION REPAIR #1 Performed by: Burgess Amor Authorized by: Burgess Amor Consent: Verbal consent obtained. Risks and benefits: risks, benefits and alternatives were discussed Consent given by: patient Patient identity  confirmed: provided demographic data Prepped and Draped in normal sterile fashion Wound explored  Laceration Location: right leg  Laceration Length: 4cm  No Foreign Bodies seen or palpated  Anesthesia: local infiltration  Local anesthetic: lidocaine 1% without epinephrine  Anesthetic total: 4 ml  Irrigation method: syringe Amount of cleaning: copious using saline after cleaning wounds in betadine.  Skin closure: ethilon 4-0  Number of sutures: 6  Technique: simple interrupted,  Loosely approximated  Patient tolerance: Patient tolerated the procedure well with no immediate complications.   LACERATION REPAIR #2 Performed by: Burgess Amor Authorized by: Burgess Amor Consent: Verbal consent obtained. Risks and benefits: risks, benefits and alternatives were discussed Consent given by: patient Patient identity confirmed: provided demographic data Prepped and Draped in normal sterile fashion Wound explored  Laceration Location: right lower leg  Laceration Length: 3cm  No Foreign Bodies seen or palpated  Anesthesia: local infiltration  Local anesthetic: lidocaine 1% without epinephrine  Anesthetic total: 2 ml  Irrigation method: syringe Amount of cleaning: copious with NS after betadine wash Skin closure: ethilon 4-0  Number of sutures: 4, loosely approximated  Technique: simple interrupted  Patient tolerance: Patient tolerated the procedure well with no immediate complications.  LACERATION REPAIR #3 Performed by: Burgess Amor Authorized by: Burgess Amor Consent: Verbal consent obtained. Risks and benefits: risks, benefits and alternatives were discussed Consent given by: patient Patient identity confirmed: provided demographic data Prepped and Draped in normal sterile fashion Wound explored  Laceration Location: right leg  Laceration Length: 2 cm  No Foreign Bodies seen or palpated  Anesthesia: local infiltration  Local anesthetic: lidocaine 1% without  epinephrine  Anesthetic total: 2 ml  Irrigation method: syringe Amount of cleaning: copious with NS after betadine cleansing  Skin closure: ethilon 4-0  Number of sutures: 2, loosely approximated  Technique: simple interupted  Patient tolerance: Patient tolerated the procedure well with no immediate complications.  LACERATION REPAIR #4 Performed by: Burgess Amor Authorized by: Burgess Amor Consent: Verbal consent obtained. Risks and benefits: risks, benefits and alternatives were discussed Consent given by: patient Patient identity confirmed: provided demographic data Prepped and Draped in normal sterile fashion Wound explored  Laceration Location: right lower leg  Laceration Length: 1cm  No Foreign Bodies seen or palpated  Anesthesia: local infiltration  Local anesthetic: lidocaine 1% without epinephrine  Anesthetic total: 1 ml  Irrigation method: syringe Amount of cleaning: copious with NS after betadine cleansing  Skin closure: ethilon 4-0  Number of sutures: 1 loosely approximated  Technique: simple interupted  Patient tolerance: Patient tolerated the procedure well with no immediate complications.     Remaining wounds were puncture wounds and were carefully explored, no foreign body appreciated,  Flushed gently with NS after betadine cleansing.  Dressings applied,  No closures performed.   Labs Review Labs Reviewed - No data to display  Imaging Review Dg Tibia/fibula Right  12/04/2013  CLINICAL DATA:  ANIMAL BITE  EXAM: RIGHT TIBIA AND FIBULA - 2 VIEW  COMPARISON:  DG ANKLE COMPLETE*R* dated 06/22/2009  FINDINGS: There is no evidence of fracture or other focal bone lesions. Multiple bubbles of gas in the calf soft tissues without radiopaque foreign bodies. Pretibial phleboliths.  IMPRESSION: Multiple bubbles of subcutaneous gas consistent with history of an animal bite, no fracture deformity, dislocation nor radiopaque foreign bodies.   Electronically  Signed   By: Awilda Metro   On: 12/04/2013 22:02     EKG Interpretation None      MDM   Final diagnoses:  Animal bites  Lacerations of multiple sites of right leg     xrays reviewed.  No retained fb or evidence of bony injury.  Sutured wound care of dog bite lacerations of right lower leg.  Puncture wounds were not sutured.  Dogs are utd on rabies.  Animal control has been notified and filed report here.  She was prescribed augmentin, oxycodone.  Encouraged f/u for any signs of infection.  Suture removal in 10 days.    Burgess Amor, PA-C 12/05/13 [phone]

## 2013-12-09 NOTE — ED Provider Notes (Signed)
Medical screening examination/treatment/procedure(s) were performed by non-physician practitioner and as supervising physician I was immediately available for consultation/collaboration.  Hurman Horn, MD 12/09/13 [phone]

## 2014-01-18 ENCOUNTER — Other Ambulatory Visit: Payer: Self-pay | Admitting: Obstetrics and Gynecology

## 2014-02-21 ENCOUNTER — Other Ambulatory Visit: Payer: Self-pay | Admitting: Obstetrics and Gynecology

## 2014-08-08 ENCOUNTER — Encounter (HOSPITAL_COMMUNITY)
Admission: RE | Admit: 2014-08-08 | Discharge: 2014-08-08 | Disposition: A | Discharge status: Home / Self Care | Payer: BC Managed Care – PPO | Source: Ambulatory Visit | Attending: Obstetrics and Gynecology | Admitting: Obstetrics and Gynecology

## 2014-08-08 ENCOUNTER — Encounter (HOSPITAL_COMMUNITY): Payer: Self-pay

## 2014-08-08 DIAGNOSIS — Z01812 Encounter for preprocedural laboratory examination: Secondary | ICD-10-CM | POA: Insufficient documentation

## 2014-08-08 HISTORY — DX: Unspecified convulsions: R56.9

## 2014-08-08 HISTORY — DX: Presence of cardiac pacemaker: Z95.0

## 2014-08-08 HISTORY — DX: Headache: R51

## 2014-08-08 HISTORY — DX: Headache, unspecified: R51.9

## 2014-08-08 HISTORY — DX: Personal history of (corrected) congenital malformations of heart and circulatory system: Z87.74

## 2014-08-08 LAB — CBC
HCT: 41.4 % (ref 36.0–46.0)
HEMOGLOBIN: 13.7 g/dL (ref 12.0–15.0)
MCH: 28.9 pg (ref 26.0–34.0)
MCHC: 33.1 g/dL (ref 30.0–36.0)
MCV: 87.3 fL (ref 78.0–100.0)
PLATELETS: 280 10*3/uL (ref 150–400)
RBC: 4.74 MIL/uL (ref 3.87–5.11)
RDW: 13.3 % (ref 11.5–15.5)
WBC: 6.3 10*3/uL (ref 4.0–10.5)

## 2014-08-08 NOTE — Patient Instructions (Signed)
Your procedure is scheduled on:08/12/14  Enter through the Main Entrance at : 6am Pick up desk phone and dial [phone] and inform us of your arrival.  Please call [phone] if you have any problems the morning of surgery.  Remember: Do not eat food or drink liquids, including water, after midnight: Sunday   You may brush your teeth the morning of surgery.   DO NOT wear jewelry, eye make-up, lipstick,body lotion, or dark fingernail polish.  (Polished toes are ok) You may wear deodorant.  If you are to be admitted after surgery, leave suitcase in car until your room has been assigned. Patients discharged on the day of surgery will not be allowed to drive home. Wear loose fitting, comfortable clothes for your ride home.

## 2014-08-08 NOTE — Pre-Procedure Instructions (Signed)
Discussed pt's past cardiac history with Dr. Arby Barrette - pt does not need a preop EKG.

## 2014-08-09 NOTE — H&P (Signed)
Tonya Gardner is an 43 year old G 3 P 3 admitted for LAVH and BSO secondary to menorrhagia unresponsive to endometrial ablation and hormonal treatment. Pertinent Gynecological History: Menses: flow is excessive with use of multiple pads or tampons on heaviest days Bleeding: dysfunctional uterine bleeding Contraception: tubal ligation DES exposure: denies Blood transfusions: none Sexually transmitted diseases: no past history Previous GYN Procedures: DNC and endometrial ablation  Last mammogram: normal Date: 2015 Last pap: normal Date: 2015 OB History: G3, P3   Menstrual History: Menarche age: unknown  No LMP recorded.    Past Medical History  Diagnosis Date  . Sinus bradycardia   . CHF (congestive heart failure) 2002    with pregnancy  . Presence of permanent cardiac pacemaker     off for 2 years  . Headache   . Seizures 2002    due to eclampsia  . Tetralogy of Fallot s/p repair 1975    Past Surgical History  Procedure Laterality Date  . Tonsillectomy    . Tetralogy of fallot repair    . Pacemaker insertion      [employer]tr[employer]   . Cesarean section      No family history on file.  Social History:  reports that she has quit smoking. She does not have any smokeless tobacco history on file. She reports that she drinks alcohol. She reports that she does not use illicit drugs.  Allergies:  Allergies  Allergen Reactions  . Sulfonamide Derivatives Hives  . Latex Rash    No prescriptions prior to admission    Review of Systems  All other systems reviewed and are negative.   There were no vitals taken for this visit. Physical Exam  Nursing note and vitals reviewed. Constitutional: She appears well-developed and well-nourished.  HENT:  Head: Normocephalic.  Eyes: Pupils are equal, round, and reactive to light.  Cardiovascular: Normal rate and regular rhythm.   Respiratory: Effort normal.    Results for orders placed or performed during the hospital encounter of  08/08/14 (from the past 24 hour(s))  CBC     Status: None   Collection Time: 08/08/14 11:08 AM  Result Value Ref Range   WBC 6.3 4.0 - 10.5 K/uL   RBC 4.74 3.87 - 5.11 MIL/uL   Hemoglobin 13.7 12.0 - 15.0 g/dL   HCT 78.5 88.5 - 02.7 %   MCV 87.3 78.0 - 100.0 fL   MCH 28.9 26.0 - 34.0 pg   MCHC 33.1 30.0 - 36.0 g/dL   RDW 74.1 28.7 - 86.7 %   Platelets 280 150 - 400 K/uL    No results found.  Assessment/Plan: Menorrhagia LAVH and BSO Consent signed Risks reviewed  Daquawn Seelman L 08/09/2014, 8:15 AM

## 2014-08-12 ENCOUNTER — Encounter (HOSPITAL_COMMUNITY): Payer: Self-pay

## 2014-08-12 ENCOUNTER — Ambulatory Visit (HOSPITAL_COMMUNITY): Payer: BC Managed Care – PPO | Admitting: Anesthesiology

## 2014-08-12 ENCOUNTER — Inpatient Hospital Stay (HOSPITAL_COMMUNITY)
Admission: RE | Admit: 2014-08-12 | Discharge: 2014-08-13 | DRG: 743 | Disposition: A | Discharge status: Home / Self Care | Payer: BC Managed Care – PPO | Source: Ambulatory Visit | Attending: Obstetrics and Gynecology | Admitting: Obstetrics and Gynecology

## 2014-08-12 ENCOUNTER — Encounter (HOSPITAL_COMMUNITY)
Admission: RE | Disposition: A | Discharge status: Home / Self Care | Payer: Self-pay | Source: Ambulatory Visit | Attending: Obstetrics and Gynecology

## 2014-08-12 DIAGNOSIS — K660 Peritoneal adhesions (postprocedural) (postinfection): Secondary | ICD-10-CM | POA: Diagnosis present

## 2014-08-12 DIAGNOSIS — I509 Heart failure, unspecified: Secondary | ICD-10-CM | POA: Diagnosis present

## 2014-08-12 DIAGNOSIS — Z950 Presence of cardiac pacemaker: Secondary | ICD-10-CM

## 2014-08-12 DIAGNOSIS — N803 Endometriosis of pelvic peritoneum: Secondary | ICD-10-CM | POA: Diagnosis present

## 2014-08-12 DIAGNOSIS — N920 Excessive and frequent menstruation with regular cycle: Secondary | ICD-10-CM | POA: Diagnosis present

## 2014-08-12 DIAGNOSIS — Z87891 Personal history of nicotine dependence: Secondary | ICD-10-CM

## 2014-08-12 DIAGNOSIS — N801 Endometriosis of ovary: Secondary | ICD-10-CM | POA: Diagnosis present

## 2014-08-12 DIAGNOSIS — Z882 Allergy status to sulfonamides status: Secondary | ICD-10-CM | POA: Diagnosis not present

## 2014-08-12 DIAGNOSIS — Z91040 Latex allergy status: Secondary | ICD-10-CM

## 2014-08-12 HISTORY — PX: ABDOMINAL HYSTERECTOMY: SHX81

## 2014-08-12 HISTORY — PX: SALPINGOOPHORECTOMY: SHX82

## 2014-08-12 HISTORY — PX: LAPAROSCOPIC ASSISTED VAGINAL HYSTERECTOMY: SHX5398

## 2014-08-12 SURGERY — HYSTERECTOMY, VAGINAL, LAPAROSCOPY-ASSISTED
Anesthesia: General | Site: Abdomen

## 2014-08-12 MED ORDER — HYDROMORPHONE HCL 1 MG/ML IJ SOLN
INTRAMUSCULAR | Status: AC
  Filled 2014-08-12: qty 1

## 2014-08-12 MED ORDER — NEOSTIGMINE METHYLSULFATE 10 MG/10ML IV SOLN
INTRAVENOUS | Status: AC
  Filled 2014-08-12: qty 1

## 2014-08-12 MED ORDER — LACTATED RINGERS IV SOLN
INTRAVENOUS | Status: DC
  Administered 2014-08-12: 07:00:00 via INTRAVENOUS

## 2014-08-12 MED ORDER — LIDOCAINE HCL (CARDIAC) 20 MG/ML IV SOLN
INTRAVENOUS | Status: DC | PRN
  Administered 2014-08-12: 40 mg via INTRAVENOUS

## 2014-08-12 MED ORDER — ACETAMINOPHEN 325 MG PO TABS: 325.0000 mg | ORAL_TABLET | ORAL | Status: DC | PRN

## 2014-08-12 MED ORDER — ONDANSETRON HCL 4 MG/2ML IJ SOLN
INTRAMUSCULAR | Status: AC
  Filled 2014-08-12: qty 2

## 2014-08-12 MED ORDER — CEFAZOLIN SODIUM-DEXTROSE 2-3 GM-% IV SOLR
INTRAVENOUS | Status: AC
  Filled 2014-08-12: qty 50

## 2014-08-12 MED ORDER — CEFAZOLIN SODIUM-DEXTROSE 2-3 GM-% IV SOLR
2.0000 g | INTRAVENOUS | Status: AC
  Administered 2014-08-12: 2 g via INTRAVENOUS

## 2014-08-12 MED ORDER — ESTRADIOL 0.1 MG/GM VA CREA
TOPICAL_CREAM | VAGINAL | Status: AC
  Filled 2014-08-12: qty 42.5

## 2014-08-12 MED ORDER — DIPHENHYDRAMINE HCL 50 MG/ML IJ SOLN: 12.5000 mg | Freq: Four times a day (QID) | INTRAMUSCULAR | Status: DC | PRN

## 2014-08-12 MED ORDER — SODIUM CHLORIDE 0.9 % IJ SOLN: 9.0000 mL | INTRAMUSCULAR | Status: DC | PRN

## 2014-08-12 MED ORDER — ONDANSETRON HCL 4 MG/2ML IJ SOLN
INTRAMUSCULAR | Status: DC | PRN
  Administered 2014-08-12: 4 mg via INTRAVENOUS

## 2014-08-12 MED ORDER — 0.9 % SODIUM CHLORIDE (POUR BTL) OPTIME
TOPICAL | Status: DC | PRN
  Administered 2014-08-12 (×2): 1000 mL

## 2014-08-12 MED ORDER — HYDROMORPHONE HCL 1 MG/ML IJ SOLN
0.2500 mg | INTRAMUSCULAR | Status: DC | PRN
  Administered 2014-08-12 (×4): 0.5 mg via INTRAVENOUS

## 2014-08-12 MED ORDER — TRAMADOL HCL 50 MG PO TABS: 50.0000 mg | ORAL_TABLET | Freq: Four times a day (QID) | ORAL | Status: DC | PRN

## 2014-08-12 MED ORDER — SCOPOLAMINE 1 MG/3DAYS TD PT72
MEDICATED_PATCH | TRANSDERMAL | Status: AC
  Administered 2014-08-12: 1.5 mg via TRANSDERMAL
  Filled 2014-08-12: qty 1

## 2014-08-12 MED ORDER — MENTHOL 3 MG MT LOZG: 1.0000 | LOZENGE | OROMUCOSAL | Status: DC | PRN

## 2014-08-12 MED ORDER — GLYCOPYRROLATE 0.2 MG/ML IJ SOLN
INTRAMUSCULAR | Status: DC | PRN
  Administered 2014-08-12: .7 mg via INTRAVENOUS
  Administered 2014-08-12: 0.2 mg via INTRAVENOUS

## 2014-08-12 MED ORDER — HYDROMORPHONE HCL 1 MG/ML IJ SOLN
INTRAMUSCULAR | Status: AC
  Administered 2014-08-12: 0.5 mg via INTRAVENOUS
  Filled 2014-08-12: qty 1

## 2014-08-12 MED ORDER — ACETAMINOPHEN 160 MG/5ML PO SOLN: 325.0000 mg | ORAL | Status: DC | PRN

## 2014-08-12 MED ORDER — MIDAZOLAM HCL 2 MG/2ML IJ SOLN
INTRAMUSCULAR | Status: AC
  Filled 2014-08-12: qty 2

## 2014-08-12 MED ORDER — PROMETHAZINE HCL 25 MG/ML IJ SOLN
6.2500 mg | INTRAMUSCULAR | Status: DC | PRN
  Administered 2014-08-12: 12.5 mg via INTRAVENOUS

## 2014-08-12 MED ORDER — DIPHENHYDRAMINE HCL 12.5 MG/5ML PO ELIX: 12.5000 mg | ORAL_SOLUTION | Freq: Four times a day (QID) | ORAL | Status: DC | PRN

## 2014-08-12 MED ORDER — HYDROMORPHONE 0.3 MG/ML IV SOLN
INTRAVENOUS | Status: DC
  Administered 2014-08-12: 0 mg via INTRAVENOUS
  Administered 2014-08-12: 0.599 mg via INTRAVENOUS
  Administered 2014-08-12: 2 mg via INTRAVENOUS
  Administered 2014-08-12: 12:00:00 via INTRAVENOUS
  Filled 2014-08-12: qty 25

## 2014-08-12 MED ORDER — LACTATED RINGERS IV SOLN
INTRAVENOUS | Status: DC
  Administered 2014-08-12: 08:00:00 via INTRAVENOUS

## 2014-08-12 MED ORDER — MIDAZOLAM HCL 5 MG/5ML IJ SOLN
INTRAMUSCULAR | Status: DC | PRN
  Administered 2014-08-12: 2 mg via INTRAVENOUS

## 2014-08-12 MED ORDER — MIDAZOLAM HCL 2 MG/2ML IJ SOLN: 0.5000 mg | Freq: Once | INTRAMUSCULAR | Status: DC | PRN

## 2014-08-12 MED ORDER — BUPIVACAINE HCL (PF) 0.25 % IJ SOLN
INTRAMUSCULAR | Status: AC
  Filled 2014-08-12: qty 30

## 2014-08-12 MED ORDER — KETOROLAC TROMETHAMINE 30 MG/ML IJ SOLN: 30.0000 mg | Freq: Once | INTRAMUSCULAR | Status: DC

## 2014-08-12 MED ORDER — FENTANYL CITRATE 0.05 MG/ML IJ SOLN
INTRAMUSCULAR | Status: DC | PRN
  Administered 2014-08-12: 100 ug via INTRAVENOUS
  Administered 2014-08-12 (×3): 50 ug via INTRAVENOUS

## 2014-08-12 MED ORDER — IBUPROFEN 600 MG PO TABS: 600.0000 mg | ORAL_TABLET | Freq: Four times a day (QID) | ORAL | Status: DC | PRN

## 2014-08-12 MED ORDER — SODIUM CHLORIDE 0.9 % IJ SOLN
INTRAMUSCULAR | Status: AC
  Filled 2014-08-12: qty 10

## 2014-08-12 MED ORDER — MEPERIDINE HCL 25 MG/ML IJ SOLN: 6.2500 mg | INTRAMUSCULAR | Status: DC | PRN

## 2014-08-12 MED ORDER — PROMETHAZINE HCL 25 MG/ML IJ SOLN
INTRAMUSCULAR | Status: AC
  Administered 2014-08-12: 12.5 mg via INTRAVENOUS
  Filled 2014-08-12: qty 1

## 2014-08-12 MED ORDER — ROCURONIUM BROMIDE 100 MG/10ML IV SOLN
INTRAVENOUS | Status: DC | PRN
  Administered 2014-08-12: 30 mg via INTRAVENOUS
  Administered 2014-08-12: 5 mg via INTRAVENOUS
  Administered 2014-08-12: 10 mg via INTRAVENOUS
  Administered 2014-08-12: 5 mg via INTRAVENOUS

## 2014-08-12 MED ORDER — FENTANYL CITRATE 0.05 MG/ML IJ SOLN
INTRAMUSCULAR | Status: AC
  Filled 2014-08-12: qty 5

## 2014-08-12 MED ORDER — GLYCOPYRROLATE 0.2 MG/ML IJ SOLN
INTRAMUSCULAR | Status: AC
Start: 2014-08-12 — End: 2014-08-12
  Filled 2014-08-12: qty 1

## 2014-08-12 MED ORDER — PROPOFOL 10 MG/ML IV EMUL
INTRAVENOUS | Status: AC
  Filled 2014-08-12: qty 20

## 2014-08-12 MED ORDER — NALOXONE HCL 0.4 MG/ML IJ SOLN: 0.4000 mg | INTRAMUSCULAR | Status: DC | PRN

## 2014-08-12 MED ORDER — SCOPOLAMINE 1 MG/3DAYS TD PT72
MEDICATED_PATCH | TRANSDERMAL | Status: AC
  Filled 2014-08-12: qty 1

## 2014-08-12 MED ORDER — ROCURONIUM BROMIDE 100 MG/10ML IV SOLN
INTRAVENOUS | Status: AC
  Filled 2014-08-12: qty 1

## 2014-08-12 MED ORDER — HYDROMORPHONE HCL 1 MG/ML IJ SOLN
INTRAMUSCULAR | Status: DC | PRN
  Administered 2014-08-12: 1 mg via INTRAVENOUS

## 2014-08-12 MED ORDER — SCOPOLAMINE 1 MG/3DAYS TD PT72
1.0000 | MEDICATED_PATCH | Freq: Once | TRANSDERMAL | Status: DC
  Administered 2014-08-12: 1.5 mg via TRANSDERMAL

## 2014-08-12 MED ORDER — KETOROLAC TROMETHAMINE 30 MG/ML IJ SOLN: 30.0000 mg | Freq: Once | INTRAMUSCULAR | Status: DC | PRN

## 2014-08-12 MED ORDER — LACTATED RINGERS IV SOLN
INTRAVENOUS | Status: DC
  Administered 2014-08-12 (×2): via INTRAVENOUS

## 2014-08-12 MED ORDER — DEXAMETHASONE SODIUM PHOSPHATE 10 MG/ML IJ SOLN
INTRAMUSCULAR | Status: AC
  Filled 2014-08-12: qty 1

## 2014-08-12 MED ORDER — DEXAMETHASONE SODIUM PHOSPHATE 4 MG/ML IJ SOLN
INTRAMUSCULAR | Status: DC | PRN
  Administered 2014-08-12: 10 mg via INTRAVENOUS

## 2014-08-12 MED ORDER — PROPOFOL INFUSION 10 MG/ML OPTIME
INTRAVENOUS | Status: DC | PRN
  Administered 2014-08-12: 2 mL via INTRAVENOUS
  Administered 2014-08-12: 16 mL via INTRAVENOUS

## 2014-08-12 MED ORDER — LIDOCAINE HCL (CARDIAC) 20 MG/ML IV SOLN
INTRAVENOUS | Status: AC
  Filled 2014-08-12: qty 5

## 2014-08-12 MED ORDER — ONDANSETRON HCL 4 MG/2ML IJ SOLN
4.0000 mg | Freq: Four times a day (QID) | INTRAMUSCULAR | Status: DC | PRN
  Administered 2014-08-12: 4 mg via INTRAVENOUS
  Filled 2014-08-12: qty 2

## 2014-08-12 MED ORDER — NEOSTIGMINE METHYLSULFATE 10 MG/10ML IV SOLN
INTRAVENOUS | Status: DC | PRN
  Administered 2014-08-12: 3.5 mg via INTRAVENOUS

## 2014-08-12 SURGICAL SUPPLY — 53 items
BARRIER ADHS 3X4 INTERCEED (GAUZE/BANDAGES/DRESSINGS) IMPLANT
BLADE SURG 10 STRL SS (BLADE) ×4 IMPLANT
BLADE SURG 11 STRL SS (BLADE) ×8 IMPLANT
CABLE HIGH FREQUENCY MONO STRZ (ELECTRODE) IMPLANT
CLOTH BEACON ORANGE TIMEOUT ST (SAFETY) ×4 IMPLANT
CONT PATH 16OZ SNAP LID 3702 (MISCELLANEOUS) ×4 IMPLANT
COVER BACK TABLE 60X90IN (DRAPES) ×4 IMPLANT
COVER MAYO STAND STRL (DRAPES) ×4 IMPLANT
DECANTER SPIKE VIAL GLASS SM (MISCELLANEOUS) IMPLANT
DILATOR CANAL MILEX (MISCELLANEOUS) ×4 IMPLANT
DRSG COVADERM PLUS 2X2 (GAUZE/BANDAGES/DRESSINGS) ×8 IMPLANT
DRSG OPSITE POSTOP 3X4 (GAUZE/BANDAGES/DRESSINGS) ×4 IMPLANT
DRSG OPSITE POSTOP 4X10 (GAUZE/BANDAGES/DRESSINGS) ×4 IMPLANT
DURAPREP 26ML APPLICATOR (WOUND CARE) ×4 IMPLANT
ELECT REM PT RETURN 9FT ADLT (ELECTROSURGICAL) ×4
ELECTRODE REM PT RTRN 9FT ADLT (ELECTROSURGICAL) ×2 IMPLANT
EVACUATOR SMOKE 8.L (FILTER) ×4 IMPLANT
GLOVE BIO SURGEON STRL SZ 6.5 (GLOVE) IMPLANT
GLOVE BIO SURGEONS STRL SZ 6.5 (GLOVE)
GLOVE BIOGEL PI IND STRL 6.5 (GLOVE) ×2 IMPLANT
GLOVE BIOGEL PI IND STRL 7.0 (GLOVE) ×4 IMPLANT
GLOVE BIOGEL PI INDICATOR 6.5 (GLOVE) ×2
GLOVE BIOGEL PI INDICATOR 7.0 (GLOVE) ×4
GLOVE SURG SS PI 6.0 STRL IVOR (GLOVE) ×4 IMPLANT
GLOVE SURG SS PI 6.5 STRL IVOR (GLOVE) ×16 IMPLANT
GOWN STRL REUS W/ TWL LRG LVL3 (GOWN DISPOSABLE) ×14 IMPLANT
GOWN STRL REUS W/TWL LRG LVL3 (GOWN DISPOSABLE) ×21
LIQUID BAND (GAUZE/BANDAGES/DRESSINGS) ×4 IMPLANT
NEEDLE INSUFFLATION 120MM (ENDOMECHANICALS) ×4 IMPLANT
NS IRRIG 1000ML POUR BTL (IV SOLUTION) ×4 IMPLANT
PACK LAVH (CUSTOM PROCEDURE TRAY) ×4 IMPLANT
PAD TRENDELENBURG OR TABLE (MISCELLANEOUS) ×4 IMPLANT
PROTECTOR NERVE ULNAR (MISCELLANEOUS) ×4 IMPLANT
SEALER TISSUE G2 CVD JAW 45CM (ENDOMECHANICALS) ×4 IMPLANT
SET IRRIG TUBING LAPAROSCOPIC (IRRIGATION / IRRIGATOR) IMPLANT
SOLUTION ELECTROLUBE (MISCELLANEOUS) IMPLANT
SPONGE LAP 18X18 X RAY DECT (DISPOSABLE) ×8 IMPLANT
SUT PLAIN 2 0 XLH (SUTURE) ×4 IMPLANT
SUT VIC AB 0 CT1 18XCR BRD8 (SUTURE) ×8 IMPLANT
SUT VIC AB 0 CT1 36 (SUTURE) ×12 IMPLANT
SUT VIC AB 0 CT1 8-18 (SUTURE) ×12
SUT VIC AB 3-0 PS2 18 (SUTURE)
SUT VIC AB 3-0 PS2 18XBRD (SUTURE) IMPLANT
SUT VIC AB 4-0 KS 27 (SUTURE) ×4 IMPLANT
SUT VICRYL 0 TIES 12 18 (SUTURE) ×4 IMPLANT
SUT VICRYL 0 UR6 27IN ABS (SUTURE) ×4 IMPLANT
TOWEL OR 17X24 6PK STRL BLUE (TOWEL DISPOSABLE) ×8 IMPLANT
TRAY FOLEY BAG SILVER LF 16FR (CATHETERS) ×4 IMPLANT
TRAY FOLEY CATH 14FR (SET/KITS/TRAYS/PACK) IMPLANT
TROCAR OPTI TIP 5M 100M (ENDOMECHANICALS) ×4 IMPLANT
TROCAR XCEL DIL TIP R 11M (ENDOMECHANICALS) ×4 IMPLANT
WARMER LAPAROSCOPE (MISCELLANEOUS) ×4 IMPLANT
WATER STERILE IRR 1000ML POUR (IV SOLUTION) ×4 IMPLANT

## 2014-08-12 NOTE — Brief Op Note (Signed)
08/12/2014  9:46 AM  PATIENT:  Tonya Gardner  43 y.o. female  PRE-OPERATIVE DIAGNOSIS:  menorrhagia  POST-OPERATIVE DIAGNOSIS:  Menorrhagia, endometriosis  PROCEDURE:  Procedure(s): ATTEMPTED LAPAROSCOPIC ASSISTED VAGINAL HYSTERECTOMY (N/A) SALPINGO OOPHORECTOMY (Bilateral) HYSTERECTOMY ABDOMINAL (N/A)  SURGEON:  Surgeon(s) and Role:    * Jeani Hawking, MD - Primary    * Mitchel Honour, DO - Assisting  PHYSICIAN ASSISTANT:   ASSISTANTS: none   ANESTHESIA:   general  EBL:  Total I/O In: 400 [I.V.:400] Out: 500 [Urine:250; Blood:250]  BLOOD ADMINISTERED:none  DRAINS: Urinary Catheter (Foley)   LOCAL MEDICATIONS USED:  NONE  SPECIMEN:  Source of Specimen:  uterus, cervix, tubes and ovaries  DISPOSITION OF SPECIMEN:  PATHOLOGY  COUNTS:  YES  TOURNIQUET:  * No tourniquets in log *  DICTATION: .Other Dictation: Dictation Number [id number]  PLAN OF CARE: Admit to inpatient   PATIENT DISPOSITION:  PACU - hemodynamically stable.   Delay start of Pharmacological VTE agent (>24hrs) due to surgical blood loss or risk of bleeding: not applicable

## 2014-08-12 NOTE — Transfer of Care (Signed)
Immediate Anesthesia Transfer of Care Note  Patient: Tonya Gardner  Procedure(s) Performed: Procedure(s): ATTEMPTED LAPAROSCOPIC ASSISTED VAGINAL HYSTERECTOMY (N/A) SALPINGO OOPHORECTOMY (Bilateral) HYSTERECTOMY ABDOMINAL (N/A)  Patient Location: PACU  Anesthesia Type:General  Level of Consciousness: awake, oriented and patient cooperative  Airway & Oxygen Therapy: Patient Spontanous Breathing and Patient connected to nasal cannula oxygen  Post-op Assessment: Report given to PACU RN and Post -op Vital signs reviewed and stable  Post vital signs: Reviewed and stable  Complications: No apparent anesthesia complications

## 2014-08-12 NOTE — Anesthesia Preprocedure Evaluation (Signed)
Anesthesia Evaluation  Patient identified by MRN, date of birth, ID band Patient awake    Reviewed: Allergy & Precautions, H&P , Patient's Chart, lab work & pertinent test results, reviewed documented beta blocker date and time   History of Anesthesia Complications Negative for: history of anesthetic complications  Airway Mallampati: II  TM Distance: >3 FB Neck ROM: full    Dental   Pulmonary former smoker,  breath sounds clear to auscultation        Cardiovascular Exercise Tolerance: Good Rhythm:regular Rate:Normal     Neuro/Psych  Headaches,    GI/Hepatic   Endo/Other    Renal/GU      Musculoskeletal   Abdominal   Peds  Hematology   Anesthesia Other Findings Had pacemaker (off for 2 years) CHF s/p pregnancy now resolved Seizures with eclampsia Tetrology repair 1975  Reproductive/Obstetrics                             Anesthesia Physical Anesthesia Plan  ASA: III  Anesthesia Plan: General ETT   Post-op Pain Management:    Induction:   Airway Management Planned:   Additional Equipment:   Intra-op Plan:   Post-operative Plan:   Informed Consent: I have reviewed the patients History and Physical, chart, labs and discussed the procedure including the risks, benefits and alternatives for the proposed anesthesia with the patient or authorized representative who has indicated his/her understanding and acceptance.   Dental Advisory Given  Plan Discussed with: CRNA and Surgeon  Anesthesia Plan Comments:         Anesthesia Quick Evaluation

## 2014-08-12 NOTE — Progress Notes (Signed)
H and P on the chart No  Changes Anesthesia waiting on latest ECHO report to review Will proceed with LAVH and BSO Consent signed

## 2014-08-12 NOTE — Anesthesia Postprocedure Evaluation (Signed)
  Anesthesia Post-op Note  Patient: Tonya Gardner  Procedure(s) Performed: Procedure(s): ATTEMPTED LAPAROSCOPIC ASSISTED VAGINAL HYSTERECTOMY (N/A) SALPINGO OOPHORECTOMY (Bilateral) HYSTERECTOMY ABDOMINAL (N/A)  Patient Location: PACU  Anesthesia Type:General  Level of Consciousness: awake, alert  and oriented  Airway and Oxygen Therapy: Patient Spontanous Breathing  Post-op Pain: mild  Post-op Assessment: Post-op Vital signs reviewed, Patient's Cardiovascular Status Stable, Respiratory Function Stable, Patent Airway, No signs of Nausea or vomiting and Pain level controlled  Post-op Vital Signs: Reviewed and stable  Last Vitals:  Filed Vitals:   08/12/14 1030  BP: 134/71  Pulse: 62  Temp:   Resp: 31    Complications: No apparent anesthesia complications

## 2014-08-12 NOTE — Anesthesia Procedure Notes (Signed)
Procedure Name: Intubation Date/Time: 08/12/2014 8:03 AM Performed by: Janeece Agee Pre-anesthesia Checklist: Patient identified, Emergency Drugs available, Suction available, Patient being monitored and Timeout performed Patient Re-evaluated:Patient Re-evaluated prior to inductionOxygen Delivery Method: Circle system utilized Preoxygenation: Pre-oxygenation with 100% oxygen Intubation Type: IV induction Laryngoscope Size: Mac and 3 Grade View: Grade II Tube type: Oral Number of attempts: 1 Airway Equipment and Method: Stylet Placement Confirmation: ETT inserted through vocal cords under direct vision,  positive ETCO2 and breath sounds checked- equal and bilateral Secured at: 21 cm Tube secured with: Tape Dental Injury: Teeth and Oropharynx as per pre-operative assessment

## 2014-08-12 NOTE — Op Note (Signed)
NAMVergia Alcon:  Coy, Hannan                ACCOUNT NO.:  [id number]  MEDICAL RECORD NO.:  [id number]  LOCATION:  [facility]                          FACILITY:  [facility]  PHYSICIAN:  Elita Dame L. Zoey Gilkeson, M.D.DATE OF BIRTH:  July 17, 1971  DATE OF PROCEDURE:  08/12/2014 DATE OF DISCHARGE:                              OPERATIVE REPORT   PREOPERATIVE DIAGNOSIS:  Menorrhagia.  POSTOPERATIVE DIAGNOSIS:  Menorrhagia and stage IV endometriosis. Endometriosis involving cul-de-sac, left pelvic sidewall, and left ovary.  PROCEDURE:  Laparoscopy with conversion to laparotomy, total abdominal hysterectomy, bilateral salpingo-oophorectomy, and extensive lysis of adhesions.  SURGEON:  Sylvan Sookdeo L. Vincente PoliGrewal, M.D.  ASSISTANT:  Dr. Langston MaskerMorris.  ANESTHESIA:  General.  EBL:  250 mL.  COMPLICATIONS:  None.  DRAINS:  Foley catheter.  PATHOLOGY:  Uterus, tubes, cervix, and ovaries.  DESCRIPTION OF PROCEDURE:  The patient was taken to the operating room. She was intubated.  She was prepped and draped.  A uterine manipulator was inserted.  The Foley catheter was inserted.  Attention was turned to the abdomen.  A small incision was made at the umbilicus.  The Veress needle was inserted and pneumoperitoneum was performed.  The Veress needle was removed and a trocar was inserted.  The laparoscope was introduced through the trocar sheath.  Upon inspection, we could see and the patient placed in Trendelenburg position.  The cul-de-sac was adherent.  The left ovary was densely adherent to the pelvic sidewall with the rectosigmoid adherent to it, and this is not something that could be resected safely through the laparoscope. The decision was made to convert to laparotomy. The trocar was then removed and 2.0 Vicryl was used to close the infraumbilical site.  The suprapubic port was then closed with Dermabond.  A laparotomy incision was made incorporating the previous C-section scar and carried down to the fascia.  Fascia  scored in the midline and extended laterally.  Rectus muscles were separated in midline.  The peritoneum was entered bluntly.  The patient was then placed in Trendelenburg position.  Lap pads were used to place the large and small bowel in the upper abdomen carefully. We then identified and inspected the pelvis.  She did have some adhesions kind of diffusely, but they were mostly focused on the left side and ovary was actually only partially visible.  We did release that slightly with some blunt dissection.  We could see that she had a large endometrioma that was deep into the pelvis, and we could see chocolate-appearing fluid once the chocolate cyst erupted.  We then proceeded with the hysterectomy with suture ligating the round ligament on either side using 0 Vicryl suture, developed the anterior and posterior lips of broad ligament.  We then placed curved Heaney clamps across the triple pedicle on either side.  Each pedicle was clamped, cut, and suture ligated using 0 Vicryl suture.  We then developed the bladder flap carefully and it was easily done.  We then placed straight Heaney clamps along the uterosacral cardinal ligament after we clamped the uterine artery at the level of the internal os.  Each clamp was clamped, cut, and suture ligated using 0 Vicryl suture, and we made sure that we  were snug and stayed the side of the specimen.  Once we reached the level of external os, we then removed the specimen.  Curved Heaney clamps had been placed just beneath the external os.  The vaginal cuff was then closed using 0 Vicryl suture.  We then went back up to the ovaries as could easily see the right tube and ovary, elevated that, placed a curved Heaney clamp just beneath that across the IP ligament with careful attention to avoid the ureter, and the specimen was then removed and the pedicle was secured using a suture ligature and a free tie of 0 Vicryl suture.  On the left side, it did  require a little bit more dissection. We were able, however, to use blunt dissection to elevate the ovary which included the chocolate cyst.  We then placed curved Heaney clamps across the IP ligament paying careful attention to stay just beneath the ovary and avoiding the ureter.  The pedicle was clamped, cut, and suture ligated using 0 Vicryl suture.  At this point, we then performed irrigation. The space where the ovary had been adherent was carefully inspected.  It was a raw surface, but it was not bleeding.  There was a little bleeder on the posterior peritoneum just beneath the vaginal cuff and that was hemostatic after 2 figure-of-eight of 0 Vicryl suture.  The irrigation was performed again.  Hemostasis was very good.  I then placed a large piece of Interceed along the raw surface on the left side and across the vaginal cuff to hopefully prevent any adhesions of the bowel to that surface.  We then removed all laparotomy pads and all instruments from the abdominal cavity.  The peritoneum was closed using 0 Vicryl.  The fascia was closed using 0 Vicryl starting at each corner and meeting in the midline.  After irrigation of the subcutaneous layer, the subcu was closed with plain gut interrupted and the skin was closed with 3-0 Vicryl on a Keith needle.  All sponge, lap, and instrument counts were correct x2.  The patient went to recovery room in stable condition.     Aqua Denslow L. Vincente Poli, M.D.     Florestine Avers  D:  08/12/2014  T:  08/12/2014  Job:  [id number]

## 2014-08-13 ENCOUNTER — Encounter (HOSPITAL_COMMUNITY): Payer: Self-pay | Admitting: Obstetrics and Gynecology

## 2014-08-13 LAB — BASIC METABOLIC PANEL
ANION GAP: 10 (ref 5–15)
BUN: 10 mg/dL (ref 6–23)
CHLORIDE: 102 meq/L (ref 96–112)
CO2: 23 mmol/L (ref 19–32)
Calcium: 8.7 mg/dL (ref 8.4–10.5)
Creatinine, Ser: 0.65 mg/dL (ref 0.50–1.10)
GFR calc Af Amer: >90 mL/min (ref >90)
GFR calc non Af Amer: >90 mL/min (ref >90)
GLUCOSE: 159 mg/dL — AB (ref 70–99)
POTASSIUM: 3.8 mmol/L (ref 3.5–5.1)
SODIUM: 135 mmol/L (ref 135–145)

## 2014-08-13 LAB — CBC
HCT: 36.6 % (ref 36.0–46.0)
HEMOGLOBIN: 12.4 g/dL (ref 12.0–15.0)
MCH: 29.1 pg (ref 26.0–34.0)
MCHC: 33.9 g/dL (ref 30.0–36.0)
MCV: 85.9 fL (ref 78.0–100.0)
Platelets: 276 10*3/uL (ref 150–400)
RBC: 4.26 MIL/uL (ref 3.87–5.11)
RDW: 13.5 % (ref 11.5–15.5)
WBC: 12.1 10*3/uL — AB (ref 4.0–10.5)

## 2014-08-13 MED ORDER — IBUPROFEN 600 MG PO TABS: 600.0000 mg | ORAL_TABLET | Freq: Four times a day (QID) | ORAL | Status: DC | PRN

## 2014-08-13 MED ORDER — OXYCODONE-ACETAMINOPHEN 5-325 MG PO TABS: 1.0000 | ORAL_TABLET | ORAL | Status: DC | PRN

## 2014-08-13 MED ORDER — OXYCODONE-ACETAMINOPHEN 5-325 MG PO TABS
1.0000 | ORAL_TABLET | ORAL | Status: DC | PRN
  Administered 2014-08-13 (×2): 1 via ORAL
  Filled 2014-08-13 (×2): qty 1

## 2014-08-13 NOTE — Progress Notes (Signed)
Pt discharged home with husband... Discharge instructions reviewed with pt and she verbalized understanding... Condition stable... No equipment... Ambulated to car with E. Brayton El, NT.

## 2014-08-13 NOTE — Discharge Summary (Signed)
  Admission Diagnosis: Menorrhagia  Discharge Diagnosis: Same Stave 4 Endometriosis  Hospital Course: 43 year old female admitted for hysterectomy. At time of surgery, she had significant adhesions and endometriosis noted. Underwent LSC, TAH and BSO. She did very well post op and was voiding and had good pain control on POD #1. She went home in good condition on POD #1. Discharge medications Percocet and Ibuprofen

## 2014-08-13 NOTE — Progress Notes (Signed)
1 Day Post-Op Procedure(s) (LRB): ATTEMPTED LAPAROSCOPIC ASSISTED VAGINAL HYSTERECTOMY (N/A) SALPINGO OOPHORECTOMY (Bilateral) HYSTERECTOMY ABDOMINAL (N/A)  Subjective: Patient reports incisional pain, tolerating PO and + flatus.    Objective: I have reviewed patient's vital signs, intake and output, medications and labs.  General: alert, cooperative and appears stated age Vaginal Bleeding: none  Assessment: s/p Procedure(s): ATTEMPTED LAPAROSCOPIC ASSISTED VAGINAL HYSTERECTOMY (N/A) SALPINGO OOPHORECTOMY (Bilateral) HYSTERECTOMY ABDOMINAL (N/A): stable, progressing well and tolerating diet  Plan: Advance diet Advance to PO medication Discontinue IV fluids  Possible discharge home later today  LOS: 1 day    Cyd Hostler L 08/13/2014, 7:59 AM

## 2014-08-13 NOTE — Anesthesia Postprocedure Evaluation (Signed)
Anesthesia Post Note  Patient: Tonya Gardner  Procedure(s) Performed: Procedure(s) (LRB): ATTEMPTED LAPAROSCOPIC ASSISTED VAGINAL HYSTERECTOMY (N/A) SALPINGO OOPHORECTOMY (Bilateral) HYSTERECTOMY ABDOMINAL (N/A)  Anesthesia type: General  Patient location: Mother/Baby  Post pain: Pain level controlled  Post assessment: Post-op Vital signs reviewed  Last Vitals:  Filed Vitals:   08/13/14 0502  BP: 119/80  Pulse: 73  Temp: 37.3 C  Resp: 20    Post vital signs: Reviewed  Level of consciousness: awake and alert   Complications: No apparent anesthesia complications

## 2014-08-13 NOTE — Addendum Note (Signed)
Addendum  created 08/13/14 0742 by Jhonnie Garner, CRNA   Modules edited: Notes Section   Notes Section:  File: [id number]

## 2015-01-10 ENCOUNTER — Encounter (HOSPITAL_COMMUNITY): Payer: Self-pay | Admitting: Emergency Medicine

## 2015-01-10 ENCOUNTER — Emergency Department (HOSPITAL_COMMUNITY)
Admission: EM | Admit: 2015-01-10 | Discharge: 2015-01-10 | Disposition: A | Discharge status: Home / Self Care | Payer: Self-pay | Attending: Emergency Medicine | Admitting: Emergency Medicine

## 2015-01-10 DIAGNOSIS — I509 Heart failure, unspecified: Secondary | ICD-10-CM | POA: Insufficient documentation

## 2015-01-10 DIAGNOSIS — Z79899 Other long term (current) drug therapy: Secondary | ICD-10-CM | POA: Insufficient documentation

## 2015-01-10 DIAGNOSIS — W260XXA Contact with knife, initial encounter: Secondary | ICD-10-CM | POA: Insufficient documentation

## 2015-01-10 DIAGNOSIS — Y9389 Activity, other specified: Secondary | ICD-10-CM | POA: Insufficient documentation

## 2015-01-10 DIAGNOSIS — Y998 Other external cause status: Secondary | ICD-10-CM | POA: Insufficient documentation

## 2015-01-10 DIAGNOSIS — Y9289 Other specified places as the place of occurrence of the external cause: Secondary | ICD-10-CM | POA: Insufficient documentation

## 2015-01-10 DIAGNOSIS — Z87891 Personal history of nicotine dependence: Secondary | ICD-10-CM | POA: Insufficient documentation

## 2015-01-10 DIAGNOSIS — S61412A Laceration without foreign body of left hand, initial encounter: Secondary | ICD-10-CM | POA: Insufficient documentation

## 2015-01-10 DIAGNOSIS — Z8669 Personal history of other diseases of the nervous system and sense organs: Secondary | ICD-10-CM | POA: Insufficient documentation

## 2015-01-10 DIAGNOSIS — Z91040 Latex allergy status: Secondary | ICD-10-CM | POA: Insufficient documentation

## 2015-01-10 MED ORDER — LIDOCAINE HCL (PF) 1 % IJ SOLN
INTRAMUSCULAR | Status: AC
  Administered 2015-01-10: 5 mL
  Filled 2015-01-10: qty 5

## 2015-01-10 NOTE — ED Provider Notes (Signed)
CSN: [id number]     Arrival date & time 01/10/15  1253 History   First MD Initiated Contact with Patient 01/10/15 1307     Chief Complaint  Patient presents with  . Extremity Laceration     (Consider location/radiation/quality/duration/timing/severity/associated sxs/prior Treatment) Patient is a 44 y.o. female presenting with skin laceration. The history is provided by the patient.  Laceration Location:  Hand Hand laceration location:  L hand Depth:  Through underlying tissue Quality: straight   Time since incident: just prior to arrival. Laceration mechanism:  Knife Pain details:    Quality:  Sharp   Severity:  Moderate   Timing:  Constant   Progression:  Improving Foreign body present:  No foreign bodies Relieved by:  Pressure Worsened by:  Nothing tried Tetanus status:  Up to date  Tonya Gardner is a 44 y.o. female who presents to the ED with a laceration to the palm of the left hand. She was making a wedding cake and while slicing the frozen layer in half the knife slipped and cut her hand.  Past Medical History  Diagnosis Date  . Sinus bradycardia   . CHF (congestive heart failure) 2002    with pregnancy  . Presence of permanent cardiac pacemaker     off for 2 years  . Headache   . Seizures 2002    due to eclampsia  . Tetralogy of Fallot s/p repair 1975   Past Surgical History  Procedure Laterality Date  . Tonsillectomy    . Tetralogy of fallot repair    . Pacemaker insertion      [employer]   . Cesarean section    . Laparoscopic assisted vaginal hysterectomy N/A 08/12/2014    Procedure: ATTEMPTED LAPAROSCOPIC ASSISTED VAGINAL HYSTERECTOMY;  Surgeon: Jeani Hawking, MD;  Location: [facility];  Service: Gynecology;  Laterality: N/A;  . Salpingoophorectomy Bilateral 08/12/2014    Procedure: SALPINGO OOPHORECTOMY;  Surgeon: Jeani Hawking, MD;  Location: [facility];  Service: Gynecology;  Laterality: Bilateral;  . Abdominal hysterectomy N/A 08/12/2014   Procedure: HYSTERECTOMY ABDOMINAL;  Surgeon: Jeani Hawking, MD;  Location: [facility];  Service: Gynecology;  Laterality: N/A;   History reviewed. No pertinent family history. History  Substance Use Topics  . Smoking status: Former Games developer  . Smokeless tobacco: Not on file  . Alcohol Use: Yes     Comment: occasionally   OB History    Gravida Para Term Preterm AB TAB SAB Ectopic Multiple Living   1              Review of Systems Negative except as stated in HPI   Allergies  Sulfonamide derivatives and Latex  Home Medications   Prior to Admission medications   Medication Sig Start Date End Date Taking? Authorizing Provider  cetirizine (ZYRTEC) 10 MG tablet Take 10 mg by mouth daily as needed. For allergies    Historical Provider, MD  ibuprofen (ADVIL,MOTRIN) 600 MG tablet Take 1 tablet (600 mg total) by mouth every 6 (six) hours as needed (mild pain). 08/13/14   Marcelle Overlie, MD  oxyCODONE-acetaminophen (PERCOCET/ROXICET) 5-325 MG per tablet Take 1-2 tablets by mouth every 3 (three) hours as needed for severe pain. 08/13/14   Marcelle Overlie, MD   BP 129/77 mmHg  Pulse 79  Temp(Src) 98 F (36.7 C) (Oral)  Resp 16  Ht 5\' 3"  (1.6 m)  Wt 185 lb (83.915 kg)  BMI 32.78 kg/m2  SpO2 100%  LMP 07/23/2014 (Approximate)  Breastfeeding? No Physical  Exam  Constitutional: She is oriented to person, place, and time. She appears well-developed and well-nourished.  HENT:  Head: Normocephalic and atraumatic.  Eyes: EOM are normal.  Neck: Neck supple.  Cardiovascular: Normal rate.   Pulmonary/Chest: Effort normal.  Musculoskeletal: Normal range of motion.       Left hand: She exhibits tenderness and laceration. She exhibits normal range of motion, normal capillary refill, no deformity and no swelling. Decreased sensation noted. Normal strength noted.       Hands: Neurological: She is alert and oriented to person, place, and time. No cranial nerve deficit.  Skin: Skin is warm and  dry.  Psychiatric: She has a normal mood and affect. Her behavior is normal.  Nursing note and vitals reviewed.   ED Course  LACERATION REPAIR Date/Time: 01/10/2015 1:20 PM Performed by: Janne Napoleon Authorized by: Janne Napoleon Risks and benefits: risks, benefits and alternatives were discussed Consent given by: patient Patient understanding: patient states understanding of the procedure being performed Required items: required blood products, implants, devices, and special equipment available Patient identity confirmed: verbally with patient Body area: upper extremity Location details: left hand Laceration length: 2 cm Foreign bodies: no foreign bodies Tendon involvement: none Nerve involvement: none Vascular damage: no Anesthesia: local infiltration Local anesthetic: lidocaine 1% without epinephrine Anesthetic total: 2 ml Patient sedated: no Preparation: Patient was prepped and draped in the usual sterile fashion. Irrigation solution: saline Irrigation method: syringe Amount of cleaning: standard Debridement: none Degree of undermining: none Skin closure: 5-0 Prolene Number of sutures: 5 Technique: simple Approximation: close Approximation difficulty: simple Dressing: pressure dressing Patient tolerance: Patient tolerated the procedure well with no immediate complications     MDM  43 y.o. female with laceration to the left hand. Stable for d/c without neurovascular deficits. She will follow up in one week for suture removal or sooner for any problems. Discussed with the patient and all questioned fully answered.  Final diagnoses:  Laceration of hand, left, initial encounter        [facility], NP 01/11/15 6045  Tilden Fossa, MD 01/11/15 1455

## 2015-01-10 NOTE — ED Notes (Addendum)
Pt reports cut left hand while making a wedding cake today. Moderate laceration noted to inside left hand distal to left thumb. Minimal bleeding noted with movement. Distal pulse moderate. Cap refill <3 secs.

## 2015-01-10 NOTE — ED Notes (Signed)
Lac to lt thenar region  Cut with a knife when slicing a cake 1 hour pta. No active bleeding.

## 2015-01-17 ENCOUNTER — Encounter (HOSPITAL_COMMUNITY): Payer: Self-pay | Admitting: *Deleted

## 2015-01-17 ENCOUNTER — Emergency Department (HOSPITAL_COMMUNITY)
Admission: EM | Admit: 2015-01-17 | Discharge: 2015-01-17 | Discharge status: Against Medical Advice | Payer: Self-pay | Attending: Emergency Medicine | Admitting: Emergency Medicine

## 2015-01-17 DIAGNOSIS — Z4802 Encounter for removal of sutures: Secondary | ICD-10-CM | POA: Insufficient documentation

## 2015-01-17 DIAGNOSIS — I509 Heart failure, unspecified: Secondary | ICD-10-CM | POA: Insufficient documentation

## 2015-01-17 NOTE — ED Notes (Signed)
Pt here for suture removal from 7 days prior to lt hand.

## 2017-10-19 ENCOUNTER — Emergency Department (HOSPITAL_COMMUNITY): Payer: Self-pay

## 2017-10-19 ENCOUNTER — Other Ambulatory Visit: Payer: Self-pay

## 2017-10-19 ENCOUNTER — Emergency Department (HOSPITAL_COMMUNITY)
Admission: EM | Admit: 2017-10-19 | Discharge: 2017-10-19 | Disposition: A | Discharge status: Home / Self Care | Payer: Self-pay | Attending: Emergency Medicine | Admitting: Emergency Medicine

## 2017-10-19 ENCOUNTER — Encounter (HOSPITAL_COMMUNITY): Payer: Self-pay | Admitting: Emergency Medicine

## 2017-10-19 DIAGNOSIS — Y929 Unspecified place or not applicable: Secondary | ICD-10-CM | POA: Insufficient documentation

## 2017-10-19 DIAGNOSIS — Y999 Unspecified external cause status: Secondary | ICD-10-CM | POA: Insufficient documentation

## 2017-10-19 DIAGNOSIS — Z91040 Latex allergy status: Secondary | ICD-10-CM | POA: Insufficient documentation

## 2017-10-19 DIAGNOSIS — I509 Heart failure, unspecified: Secondary | ICD-10-CM | POA: Insufficient documentation

## 2017-10-19 DIAGNOSIS — Y9389 Activity, other specified: Secondary | ICD-10-CM | POA: Insufficient documentation

## 2017-10-19 DIAGNOSIS — W228XXA Striking against or struck by other objects, initial encounter: Secondary | ICD-10-CM | POA: Insufficient documentation

## 2017-10-19 DIAGNOSIS — Z950 Presence of cardiac pacemaker: Secondary | ICD-10-CM | POA: Insufficient documentation

## 2017-10-19 DIAGNOSIS — I110 Hypertensive heart disease with heart failure: Secondary | ICD-10-CM | POA: Insufficient documentation

## 2017-10-19 DIAGNOSIS — Z87891 Personal history of nicotine dependence: Secondary | ICD-10-CM | POA: Insufficient documentation

## 2017-10-19 DIAGNOSIS — S60222A Contusion of left hand, initial encounter: Secondary | ICD-10-CM | POA: Insufficient documentation

## 2017-10-19 MED ORDER — IBUPROFEN 600 MG PO TABS: 600.0000 mg | ORAL_TABLET | Freq: Four times a day (QID) | ORAL | 0 refills | Status: DC | PRN

## 2017-10-19 MED ORDER — HYDROCODONE-ACETAMINOPHEN 5-325 MG PO TABS: 1.0000 | ORAL_TABLET | ORAL | 0 refills | Status: DC | PRN

## 2017-10-19 MED ORDER — HYDROCODONE-ACETAMINOPHEN 5-325 MG PO TABS
1.0000 | ORAL_TABLET | Freq: Once | ORAL | Status: AC
  Administered 2017-10-19: 1 via ORAL
  Filled 2017-10-19: qty 1

## 2017-10-19 MED ORDER — CEPHALEXIN 500 MG PO CAPS: 500.0000 mg | ORAL_CAPSULE | Freq: Four times a day (QID) | ORAL | 0 refills | Status: DC

## 2017-10-19 MED ORDER — CEPHALEXIN 500 MG PO CAPS
500.0000 mg | ORAL_CAPSULE | Freq: Once | ORAL | Status: AC
  Administered 2017-10-19: 500 mg via ORAL
  Filled 2017-10-19: qty 1

## 2017-10-19 MED ORDER — IBUPROFEN 800 MG PO TABS
800.0000 mg | ORAL_TABLET | Freq: Once | ORAL | Status: AC
  Administered 2017-10-19: 800 mg via ORAL
  Filled 2017-10-19: qty 1

## 2017-10-19 NOTE — ED Triage Notes (Signed)
Pt states dog pulled leash catching her hand. Abrasion and swelling noted to top of hand.

## 2017-10-19 NOTE — ED Provider Notes (Signed)
[facility] EMERGENCY DEPARTMENT Provider Note   CSN: [id number] Arrival date & time: 10/19/17  1549     History   Chief Complaint Chief Complaint  Patient presents with  . Hand Injury    HPI Tonya Gardner is a 47 y.o. female, right-handed, presenting with left hand pain swelling and bruising after she hit her hand while she was trying to control her dog while on a leash.  She is on sure if her hand hit the gait latch on his kennel or if he hit her hand with his paw as he jumped up on her.  Of note he is a 185 pound mastiff.  This injury occurred just prior to arrival.  She reports almost immediately having swelling to the site along with bruising.  There is a small abrasion which she said bled for about 5 minutes but has resolved.  She has had no treatment prior to arrival and states she is up-to-date with her tetanus vaccine.  She denies numbness distal to the injury site.  The history is provided by the patient.    Past Medical History:  Diagnosis Date  . CHF (congestive heart failure) (HCC) 2002   with pregnancy  . Headache   . Presence of permanent cardiac pacemaker    off for 2 years  . Seizures (HCC) 2002   due to eclampsia  . Sinus bradycardia   . Tetralogy of Fallot s/p repair 1975    Patient Active Problem List   Diagnosis Date Noted  . Menorrhagia 08/12/2014  . ESSENTIAL HYPERTENSION, BENIGN 10/16/2009  . CARDIAC PACEMAKER IN SITU 10/16/2009  . SINUS BRADYCARDIA 10/13/2009  . CHF 10/13/2009    Past Surgical History:  Procedure Laterality Date  . ABDOMINAL HYSTERECTOMY N/A 08/12/2014   Procedure: HYSTERECTOMY ABDOMINAL;  Surgeon: Jeani Hawking, MD;  Location: [facility];  Service: Gynecology;  Laterality: N/A;  . CESAREAN SECTION    . LAPAROSCOPIC ASSISTED VAGINAL HYSTERECTOMY N/A 08/12/2014   Procedure: ATTEMPTED LAPAROSCOPIC ASSISTED VAGINAL HYSTERECTOMY;  Surgeon: Jeani Hawking, MD;  Location: [facility];  Service: Gynecology;  Laterality: N/A;  .  PACEMAKER INSERTION     [employer]   . SALPINGOOPHORECTOMY Bilateral 08/12/2014   Procedure: SALPINGO OOPHORECTOMY;  Surgeon: Jeani Hawking, MD;  Location: [facility];  Service: Gynecology;  Laterality: Bilateral;  . TETRALOGY OF FALLOT REPAIR    . TONSILLECTOMY      OB History    Gravida Para Term Preterm AB Living   1             SAB TAB Ectopic Multiple Live Births                   Home Medications    Prior to Admission medications   Medication Sig Start Date End Date Taking? Authorizing Provider  cephALEXin (KEFLEX) 500 MG capsule Take 1 capsule (500 mg total) by mouth 4 (four) times daily. 10/19/17   Burgess Amor, PA-C  cetirizine (ZYRTEC) 10 MG tablet Take 10 mg by mouth daily as needed. For allergies    [provider]  HYDROcodone-acetaminophen (NORCO/VICODIN) 5-325 MG tablet Take 1 tablet by mouth every 4 (four) hours as needed. 10/19/17   Burgess Amor, PA-C  ibuprofen (ADVIL,MOTRIN) 600 MG tablet Take 1 tablet (600 mg total) by mouth every 6 (six) hours as needed. 10/19/17   Burgess Amor, PA-C  oxyCODONE-acetaminophen (PERCOCET/ROXICET) 5-325 MG per tablet Take 1-2 tablets by mouth every 3 (three) hours as needed for severe pain.  08/13/14   Marcelle Overlie, MD    Family History History reviewed. No pertinent family history.  Social History Social History   Tobacco Use  . Smoking status: Former Smoker  Substance Use Topics  . Alcohol use: Yes    Comment: occasionally  . Drug use: No     Allergies   Sulfonamide derivatives and Latex   Review of Systems Review of Systems  Constitutional: Negative for fever.  Musculoskeletal: Positive for arthralgias and joint swelling. Negative for myalgias.  Skin: Positive for wound.  Neurological: Negative for weakness and numbness.     Physical Exam Updated Vital Signs BP 135/88 (BP Location: Left Arm)   Pulse 77   Temp 98.2 F (36.8 C) (Oral)   Resp 20   LMP 07/23/2014 (Approximate)   SpO2 100%    Physical Exam  Constitutional: She appears well-developed and well-nourished.  HENT:  Head: Atraumatic.  Neck: Normal range of motion.  Cardiovascular:  Pulses equal bilaterally  Musculoskeletal: She exhibits edema and tenderness.       Hands: Patient has a raised hematoma of her dorsal left hand distally at the base of her index and long fingers.  There is 1/2 cm deep abrasion which is well approximated, no bleeding.  Distal sensation is intact.  She displays full extension of her fingers she can flex her fingers approximately halfway into a fist after which this movement becomes painful.  There is no palpable deformity.  Wrist is nontender.  Neurological: She is alert. She has normal strength. She displays normal reflexes. No sensory deficit.  Skin: Skin is warm and dry.  Psychiatric: She has a normal mood and affect.     ED Treatments / Results  Labs (all labs ordered are listed, but only abnormal results are displayed) Labs Reviewed - No data to display  EKG  EKG Interpretation None       Radiology Dg Hand Complete Left  Result Date: 10/19/2017 CLINICAL DATA:  Injured left hand.  Abrasion and swelling. EXAM: LEFT HAND - COMPLETE 3+ VIEW COMPARISON:  None. FINDINGS: The joint spaces are maintained.  No acute fracture is identified. IMPRESSION: No acute bony findings. Electronically Signed   By: Rudie Meyer M.D.   On: 10/19/2017 16:52    Procedures Procedures (including critical care time)  Medications Ordered in ED Medications  ibuprofen (ADVIL,MOTRIN) tablet 800 mg (not administered)  HYDROcodone-acetaminophen (NORCO/VICODIN) 5-325 MG per tablet 1 tablet (not administered)  cephALEXin (KEFLEX) capsule 500 mg (not administered)     Initial Impression / Assessment and Plan / ED Course  I have reviewed the triage vital signs and the nursing notes.  Pertinent labs & imaging results that were available during my care of the patient were reviewed by me and  considered in my medical decision making (see chart for details).     Patient with normal imaging, this was reviewed with her.  There is a small well approximated deep abrasion/laceration, not amenable to suturing.  It is unclear the exact mechanism of this skin injury.  She will be covered with Keflex to help minimize risk of infection at this site.  Discussed ice, elevation, compression.  PRN follow-up anticipated.  Final Clinical Impressions(s) / ED Diagnoses   Final diagnoses:  Contusion of left hand, initial encounter    ED Discharge Orders        Ordered    cephALEXin (KEFLEX) 500 MG capsule  4 times daily     10/19/17 1843    ibuprofen (ADVIL,MOTRIN)  600 MG tablet  Every 6 hours PRN     10/19/17 1843    HYDROcodone-acetaminophen (NORCO/VICODIN) 5-325 MG tablet  Every 4 hours PRN     10/19/17 1843       Burgess Amor, PA-C 10/19/17 1845    Vanetta Mulders, MD 10/20/17 1810

## 2017-10-19 NOTE — Discharge Instructions (Signed)
As reviewed your x-rays are negative tonight for any bony injuries.  Use ice and elevation as much as possible which should help reduce the swelling and the pain at the site of your injury.  You may use the medications prescribed, use caution using hydrocodone as this will make you drowsy.  Do not drive within 4 hours of taking this medication.  Apply an Ace wrap as desired to help protect the site and this may also help reduce the swelling quicker.

## 2018-02-01 ENCOUNTER — Other Ambulatory Visit: Payer: Self-pay | Admitting: Physician Assistant

## 2018-02-01 DIAGNOSIS — H905 Unspecified sensorineural hearing loss: Secondary | ICD-10-CM

## 2018-02-01 DIAGNOSIS — H903 Sensorineural hearing loss, bilateral: Secondary | ICD-10-CM

## 2018-02-15 ENCOUNTER — Ambulatory Visit
Admission: RE | Admit: 2018-02-15 | Discharge: 2018-02-15 | Disposition: A | Discharge status: Home / Self Care | Payer: 59 | Source: Ambulatory Visit | Attending: Physician Assistant | Admitting: Physician Assistant

## 2018-02-15 ENCOUNTER — Other Ambulatory Visit: Payer: Self-pay | Admitting: Physician Assistant

## 2018-02-15 DIAGNOSIS — H905 Unspecified sensorineural hearing loss: Secondary | ICD-10-CM

## 2018-02-15 DIAGNOSIS — H903 Sensorineural hearing loss, bilateral: Secondary | ICD-10-CM

## 2018-06-28 ENCOUNTER — Encounter (HOSPITAL_BASED_OUTPATIENT_CLINIC_OR_DEPARTMENT_OTHER): Payer: Self-pay | Admitting: *Deleted

## 2018-06-28 ENCOUNTER — Other Ambulatory Visit: Payer: Self-pay

## 2018-06-28 ENCOUNTER — Emergency Department (HOSPITAL_BASED_OUTPATIENT_CLINIC_OR_DEPARTMENT_OTHER)
Admission: EM | Admit: 2018-06-28 | Discharge: 2018-06-28 | Disposition: A | Discharge status: Home / Self Care | Payer: 59 | Attending: Emergency Medicine | Admitting: Emergency Medicine

## 2018-06-28 DIAGNOSIS — Z91040 Latex allergy status: Secondary | ICD-10-CM | POA: Diagnosis not present

## 2018-06-28 DIAGNOSIS — N12 Tubulo-interstitial nephritis, not specified as acute or chronic: Secondary | ICD-10-CM

## 2018-06-28 DIAGNOSIS — I110 Hypertensive heart disease with heart failure: Secondary | ICD-10-CM | POA: Insufficient documentation

## 2018-06-28 DIAGNOSIS — Z950 Presence of cardiac pacemaker: Secondary | ICD-10-CM | POA: Insufficient documentation

## 2018-06-28 DIAGNOSIS — N10 Acute tubulo-interstitial nephritis: Secondary | ICD-10-CM | POA: Insufficient documentation

## 2018-06-28 DIAGNOSIS — Z87891 Personal history of nicotine dependence: Secondary | ICD-10-CM | POA: Insufficient documentation

## 2018-06-28 DIAGNOSIS — I509 Heart failure, unspecified: Secondary | ICD-10-CM | POA: Diagnosis not present

## 2018-06-28 DIAGNOSIS — R1012 Left upper quadrant pain: Secondary | ICD-10-CM | POA: Diagnosis present

## 2018-06-28 DIAGNOSIS — Z79899 Other long term (current) drug therapy: Secondary | ICD-10-CM | POA: Insufficient documentation

## 2018-06-28 LAB — BASIC METABOLIC PANEL
ANION GAP: 8 (ref 5–15)
BUN: 16 mg/dL (ref 6–20)
CALCIUM: 9.1 mg/dL (ref 8.9–10.3)
CHLORIDE: 103 mmol/L (ref 98–111)
CO2: 27 mmol/L (ref 22–32)
CREATININE: 0.73 mg/dL (ref 0.44–1.00)
GFR calc non Af Amer: >60 mL/min (ref >60)
Glucose, Bld: 87 mg/dL (ref 70–99)
Potassium: 3.9 mmol/L (ref 3.5–5.1)
Sodium: 138 mmol/L (ref 135–145)

## 2018-06-28 LAB — URINALYSIS, ROUTINE W REFLEX MICROSCOPIC
Bilirubin Urine: NEGATIVE
Glucose, UA: NEGATIVE mg/dL
Hgb urine dipstick: NEGATIVE
Ketones, ur: NEGATIVE mg/dL
Nitrite: NEGATIVE
PROTEIN: NEGATIVE mg/dL
Specific Gravity, Urine: 1.02 (ref 1.005–1.030)
pH: 6.5 (ref 5.0–8.0)

## 2018-06-28 LAB — CBC
HEMATOCRIT: 41.8 % (ref 36.0–46.0)
HEMOGLOBIN: 12.9 g/dL (ref 12.0–15.0)
MCH: 27.3 pg (ref 26.0–34.0)
MCHC: 30.9 g/dL (ref 30.0–36.0)
MCV: 88.4 fL (ref 80.0–100.0)
Platelets: 281 10*3/uL (ref 150–400)
RBC: 4.73 MIL/uL (ref 3.87–5.11)
RDW: 13.6 % (ref 11.5–15.5)
WBC: 5.5 10*3/uL (ref 4.0–10.5)
nRBC: 0 % (ref 0.0–0.2)

## 2018-06-28 LAB — URINALYSIS, MICROSCOPIC (REFLEX)

## 2018-06-28 MED ORDER — SODIUM CHLORIDE 0.9 % IV BOLUS
1000.0000 mL | Freq: Once | INTRAVENOUS | Status: AC
  Administered 2018-06-28: 1000 mL via INTRAVENOUS

## 2018-06-28 MED ORDER — SODIUM CHLORIDE 0.9 % IV SOLN
1.0000 g | Freq: Once | INTRAVENOUS | Status: AC
  Administered 2018-06-28: 1 g via INTRAVENOUS
  Filled 2018-06-28: qty 10

## 2018-06-28 MED ORDER — ONDANSETRON HCL 4 MG/2ML IJ SOLN
4.0000 mg | Freq: Once | INTRAMUSCULAR | Status: AC
  Administered 2018-06-28: 4 mg via INTRAVENOUS
  Filled 2018-06-28: qty 2

## 2018-06-28 MED ORDER — CEPHALEXIN 500 MG PO CAPS: 500.0000 mg | ORAL_CAPSULE | Freq: Three times a day (TID) | ORAL | 0 refills | Status: AC

## 2018-06-28 MED ORDER — IBUPROFEN 600 MG PO TABS: 600.0000 mg | ORAL_TABLET | Freq: Three times a day (TID) | ORAL | 0 refills | Status: DC | PRN

## 2018-06-28 MED ORDER — ONDANSETRON 8 MG PO TBDP: 8.0000 mg | ORAL_TABLET | Freq: Three times a day (TID) | ORAL | 0 refills | Status: DC | PRN

## 2018-06-28 NOTE — ED Notes (Signed)
ED Provider at bedside. 

## 2018-06-28 NOTE — ED Triage Notes (Signed)
Intermittent left flank pain x4 days.  Got bad yesterday. Urinary frequency with dysuria.

## 2018-06-28 NOTE — ED Provider Notes (Signed)
[facility] EMERGENCY DEPARTMENT Provider Note   CSN: [id number] Arrival date & time: 06/28/18  0932     History   Chief Complaint Chief Complaint  Patient presents with  . Flank Pain    HPI Tonya Gardner is a 47 y.o. female.  HPI 47 year old female presents the emergency department with complaints of 2 weeks of urinary frequency and mild dysuria now presents with 4 days of worsening left flank pain with associated nausea and vomiting.  Denies fever but reports possible chills.  No radiation of her pain around to the front.  No history of kidney stones.  She has had a history of UTIs before.  No vaginal complaints.  Symptoms are moderate in severity.  Patient does not want any medications for pain at this time.  She took anti-inflammatories earlier today with some improvement in her pain.  Decreased oral intake over the past 24 hours.   Past Medical History:  Diagnosis Date  . CHF (congestive heart failure) (HCC) 2002   with pregnancy  . Headache   . Presence of permanent cardiac pacemaker    off for 2 years  . Seizures (HCC) 2002   due to eclampsia  . Sinus bradycardia   . Tetralogy of Fallot s/p repair 1975    Patient Active Problem List   Diagnosis Date Noted  . Menorrhagia 08/12/2014  . ESSENTIAL HYPERTENSION, BENIGN 10/16/2009  . CARDIAC PACEMAKER IN SITU 10/16/2009  . SINUS BRADYCARDIA 10/13/2009  . CHF 10/13/2009    Past Surgical History:  Procedure Laterality Date  . ABDOMINAL HYSTERECTOMY N/A 08/12/2014   Procedure: HYSTERECTOMY ABDOMINAL;  Surgeon: Jeani Hawking, MD;  Location: [facility];  Service: Gynecology;  Laterality: N/A;  . CESAREAN SECTION    . LAPAROSCOPIC ASSISTED VAGINAL HYSTERECTOMY N/A 08/12/2014   Procedure: ATTEMPTED LAPAROSCOPIC ASSISTED VAGINAL HYSTERECTOMY;  Surgeon: Jeani Hawking, MD;  Location: [facility];  Service: Gynecology;  Laterality: N/A;  . PACEMAKER INSERTION     [employer]   . SALPINGOOPHORECTOMY Bilateral  08/12/2014   Procedure: SALPINGO OOPHORECTOMY;  Surgeon: Jeani Hawking, MD;  Location: [facility];  Service: Gynecology;  Laterality: Bilateral;  . TETRALOGY OF FALLOT REPAIR    . TONSILLECTOMY       OB History    Gravida  3   Para      Term      Preterm      AB      Living        SAB      TAB      Ectopic      Multiple      Live Births  2            Home Medications    Prior to Admission medications   Medication Sig Start Date End Date Taking? Authorizing Provider  cetirizine (ZYRTEC) 10 MG tablet Take 10 mg by mouth daily as needed. For allergies   Yes [provider]  Vitamin D, Ergocalciferol, (DRISDOL) 1.25 MG (50000 UT) CAPS capsule Take 50,000 Units by mouth every 7 (seven) days.   Yes [provider]  cephALEXin (KEFLEX) 500 MG capsule Take 1 capsule (500 mg total) by mouth 3 (three) times daily for 10 days. 06/28/18 07/08/18  Azalia Bilis, MD  ibuprofen (ADVIL,MOTRIN) 600 MG tablet Take 1 tablet (600 mg total) by mouth every 8 (eight) hours as needed for mild pain or moderate pain. 06/28/18   Azalia Bilis, MD  ondansetron (ZOFRAN ODT) 8 MG  disintegrating tablet Take 1 tablet (8 mg total) by mouth every 8 (eight) hours as needed for nausea or vomiting. 06/28/18   Azalia Bilis, MD    Family History History reviewed. No pertinent family history.  Social History Social History   Tobacco Use  . Smoking status: Former Games developer  . Smokeless tobacco: Never Used  Substance Use Topics  . Alcohol use: Yes    Comment: occasionally  . Drug use: No     Allergies   Sulfonamide derivatives and Latex   Review of Systems Review of Systems  All other systems reviewed and are negative.    Physical Exam Updated Vital Signs BP 130/77 (BP Location: Right Arm)   Pulse 79   Temp 97.8 F (36.6 C) (Oral)   Resp 16   Ht 5\' 3"  (1.6 m)   Wt 90.7 kg   LMP 07/23/2014 (Approximate)   SpO2 96%   BMI 35.43 kg/m   Physical Exam    Constitutional: She is oriented to person, place, and time. She appears well-developed and well-nourished. No distress.  HENT:  Head: Normocephalic and atraumatic.  Eyes: EOM are normal.  Neck: Normal range of motion.  Cardiovascular: Normal rate, regular rhythm and normal heart sounds.  Pulmonary/Chest: Effort normal and breath sounds normal.  Abdominal: Soft. She exhibits no distension. There is no tenderness.  Genitourinary:  Genitourinary Comments: Left CVA tenderness  Musculoskeletal: Normal range of motion.  Neurological: She is alert and oriented to person, place, and time.  Skin: Skin is warm and dry.  Psychiatric: She has a normal mood and affect. Judgment normal.  Nursing note and vitals reviewed.    ED Treatments / Results  Labs (all labs ordered are listed, but only abnormal results are displayed) Labs Reviewed  URINALYSIS, ROUTINE W REFLEX MICROSCOPIC - Abnormal; Notable for the following components:      Result Value   Leukocytes, UA MODERATE (*)    All other components within normal limits  URINALYSIS, MICROSCOPIC (REFLEX) - Abnormal; Notable for the following components:   Bacteria, UA MANY (*)    All other components within normal limits  URINE CULTURE  CBC  BASIC METABOLIC PANEL    EKG None  Radiology No results found.  Procedures Procedures (including critical care time)  Medications Ordered in ED Medications  sodium chloride 0.9 % bolus 1,000 mL (0 mLs Intravenous Stopped 06/28/18 1129)  ondansetron (ZOFRAN) injection 4 mg (4 mg Intravenous Given 06/28/18 1026)  cefTRIAXone (ROCEPHIN) 1 g in sodium chloride 0.9 % 100 mL IVPB (1 g Intravenous New Bag/Given 06/28/18 1128)     Initial Impression / Assessment and Plan / ED Course  I have reviewed the triage vital signs and the nursing notes.  Pertinent labs & imaging results that were available during my care of the patient were reviewed by me and considered in my medical decision making (see chart  for details).     Patient is overall well-appearing.  I suspect this is left-sided pyelonephritis.  Nothing to suggest renal colic at this time.  Urine culture sent.  IV Rocephin given.  Home with Keflex.  Close primary care follow-up.  Patient encouraged to return the emergency department for new or worsening symptoms.    Final Clinical Impressions(s) / ED Diagnoses   Final diagnoses:  Pyelonephritis    ED Discharge Orders         Ordered    cephALEXin (KEFLEX) 500 MG capsule  3 times daily     06/28/18 1157  ondansetron (ZOFRAN ODT) 8 MG disintegrating tablet  Every 8 hours PRN     06/28/18 1157    ibuprofen (ADVIL,MOTRIN) 600 MG tablet  Every 8 hours PRN     06/28/18 1157           Azalia Bilis, MD 06/28/18 1202

## 2018-06-29 LAB — URINE CULTURE

## 2020-06-01 ENCOUNTER — Other Ambulatory Visit: Payer: Self-pay

## 2020-06-01 ENCOUNTER — Observation Stay (HOSPITAL_COMMUNITY)
Admission: EM | Admit: 2020-06-01 | Discharge: 2020-06-03 | Disposition: A | Discharge status: Home / Self Care | Payer: BC Managed Care – PPO | Attending: Internal Medicine | Admitting: Internal Medicine

## 2020-06-01 ENCOUNTER — Encounter (HOSPITAL_COMMUNITY): Payer: Self-pay | Admitting: Emergency Medicine

## 2020-06-01 DIAGNOSIS — Z8679 Personal history of other diseases of the circulatory system: Secondary | ICD-10-CM

## 2020-06-01 DIAGNOSIS — R61 Generalized hyperhidrosis: Secondary | ICD-10-CM | POA: Diagnosis not present

## 2020-06-01 DIAGNOSIS — I471 Supraventricular tachycardia: Secondary | ICD-10-CM | POA: Diagnosis not present

## 2020-06-01 DIAGNOSIS — R079 Chest pain, unspecified: Secondary | ICD-10-CM

## 2020-06-01 DIAGNOSIS — Z91040 Latex allergy status: Secondary | ICD-10-CM | POA: Diagnosis not present

## 2020-06-01 DIAGNOSIS — I509 Heart failure, unspecified: Secondary | ICD-10-CM | POA: Insufficient documentation

## 2020-06-01 DIAGNOSIS — Z20822 Contact with and (suspected) exposure to covid-19: Secondary | ICD-10-CM | POA: Diagnosis not present

## 2020-06-01 DIAGNOSIS — Q213 Tetralogy of Fallot: Secondary | ICD-10-CM | POA: Insufficient documentation

## 2020-06-01 DIAGNOSIS — R002 Palpitations: Secondary | ICD-10-CM | POA: Diagnosis not present

## 2020-06-01 DIAGNOSIS — Z87891 Personal history of nicotine dependence: Secondary | ICD-10-CM | POA: Insufficient documentation

## 2020-06-01 DIAGNOSIS — J9811 Atelectasis: Secondary | ICD-10-CM | POA: Diagnosis not present

## 2020-06-01 DIAGNOSIS — I517 Cardiomegaly: Secondary | ICD-10-CM | POA: Diagnosis not present

## 2020-06-01 DIAGNOSIS — I480 Paroxysmal atrial fibrillation: Principal | ICD-10-CM | POA: Insufficient documentation

## 2020-06-01 DIAGNOSIS — I110 Hypertensive heart disease with heart failure: Secondary | ICD-10-CM | POA: Insufficient documentation

## 2020-06-01 DIAGNOSIS — Z950 Presence of cardiac pacemaker: Secondary | ICD-10-CM | POA: Insufficient documentation

## 2020-06-01 DIAGNOSIS — R739 Hyperglycemia, unspecified: Secondary | ICD-10-CM

## 2020-06-01 DIAGNOSIS — E669 Obesity, unspecified: Secondary | ICD-10-CM

## 2020-06-01 DIAGNOSIS — Z8774 Personal history of (corrected) congenital malformations of heart and circulatory system: Secondary | ICD-10-CM

## 2020-06-01 DIAGNOSIS — R42 Dizziness and giddiness: Secondary | ICD-10-CM | POA: Diagnosis not present

## 2020-06-01 MED ORDER — ADENOSINE 6 MG/2ML IV SOLN: 6.0000 mg | Freq: Once | INTRAVENOUS | Status: AC

## 2020-06-01 MED ORDER — ONDANSETRON HCL 4 MG/2ML IJ SOLN
INTRAMUSCULAR | Status: AC
  Administered 2020-06-02: 4 mg via INTRAVENOUS
  Filled 2020-06-01: qty 2

## 2020-06-01 MED ORDER — ADENOSINE 6 MG/2ML IV SOLN
INTRAVENOUS | Status: AC
  Administered 2020-06-02: 6 mg via INTRAVENOUS
  Filled 2020-06-01: qty 2

## 2020-06-01 MED ORDER — MIDAZOLAM HCL 5 MG/5ML IJ SOLN
INTRAMUSCULAR | Status: AC
  Administered 2020-06-02: 2.5 mg via INTRAVENOUS
  Filled 2020-06-01: qty 5

## 2020-06-01 NOTE — ED Triage Notes (Signed)
Pt states she has been feeling "funny" since 2130 tonight. States her heart has been racing and that she has been diaphoretic and dizzy.

## 2020-06-02 ENCOUNTER — Observation Stay (HOSPITAL_BASED_OUTPATIENT_CLINIC_OR_DEPARTMENT_OTHER): Payer: BC Managed Care – PPO

## 2020-06-02 ENCOUNTER — Emergency Department (HOSPITAL_COMMUNITY): Payer: BC Managed Care – PPO

## 2020-06-02 DIAGNOSIS — Z8774 Personal history of (corrected) congenital malformations of heart and circulatory system: Secondary | ICD-10-CM

## 2020-06-02 DIAGNOSIS — I361 Nonrheumatic tricuspid (valve) insufficiency: Secondary | ICD-10-CM

## 2020-06-02 DIAGNOSIS — I480 Paroxysmal atrial fibrillation: Secondary | ICD-10-CM

## 2020-06-02 DIAGNOSIS — E669 Obesity, unspecified: Secondary | ICD-10-CM

## 2020-06-02 DIAGNOSIS — J9811 Atelectasis: Secondary | ICD-10-CM | POA: Diagnosis not present

## 2020-06-02 DIAGNOSIS — I340 Nonrheumatic mitral (valve) insufficiency: Secondary | ICD-10-CM | POA: Diagnosis not present

## 2020-06-02 DIAGNOSIS — Z8679 Personal history of other diseases of the circulatory system: Secondary | ICD-10-CM

## 2020-06-02 DIAGNOSIS — R739 Hyperglycemia, unspecified: Secondary | ICD-10-CM

## 2020-06-02 DIAGNOSIS — Z20822 Contact with and (suspected) exposure to covid-19: Secondary | ICD-10-CM | POA: Diagnosis not present

## 2020-06-02 DIAGNOSIS — I517 Cardiomegaly: Secondary | ICD-10-CM | POA: Diagnosis not present

## 2020-06-02 DIAGNOSIS — I471 Supraventricular tachycardia: Secondary | ICD-10-CM | POA: Diagnosis not present

## 2020-06-02 DIAGNOSIS — R61 Generalized hyperhidrosis: Secondary | ICD-10-CM | POA: Diagnosis not present

## 2020-06-02 DIAGNOSIS — R42 Dizziness and giddiness: Secondary | ICD-10-CM | POA: Diagnosis not present

## 2020-06-02 DIAGNOSIS — I371 Nonrheumatic pulmonary valve insufficiency: Secondary | ICD-10-CM | POA: Diagnosis not present

## 2020-06-02 LAB — CBC WITH DIFFERENTIAL/PLATELET
Abs Immature Granulocytes: 0.02 10*3/uL (ref 0.00–0.07)
Basophils Absolute: 0 10*3/uL (ref 0.0–0.1)
Basophils Relative: 0 %
Eosinophils Absolute: 0 10*3/uL (ref 0.0–0.5)
Eosinophils Relative: 1 %
HCT: 38.9 % (ref 36.0–46.0)
Hemoglobin: 12.4 g/dL (ref 12.0–15.0)
Immature Granulocytes: 0 %
Lymphocytes Relative: 14 %
Lymphs Abs: 1.3 10*3/uL (ref 0.7–4.0)
MCH: 28.2 pg (ref 26.0–34.0)
MCHC: 31.9 g/dL (ref 30.0–36.0)
MCV: 88.6 fL (ref 80.0–100.0)
Monocytes Absolute: 0.4 10*3/uL (ref 0.1–1.0)
Monocytes Relative: 5 %
Neutro Abs: 7 10*3/uL (ref 1.7–7.7)
Neutrophils Relative %: 80 %
Platelets: 266 10*3/uL (ref 150–400)
RBC: 4.39 MIL/uL (ref 3.87–5.11)
RDW: 13.7 % (ref 11.5–15.5)
WBC: 8.8 10*3/uL (ref 4.0–10.5)
nRBC: 0 % (ref 0.0–0.2)

## 2020-06-02 LAB — ECHOCARDIOGRAM COMPLETE
Area-P 1/2: 5.79 cm2
Height: 62 in
S' Lateral: 3.49 cm
Weight: 2880 oz

## 2020-06-02 LAB — COMPREHENSIVE METABOLIC PANEL
ALT: 108 U/L — ABNORMAL HIGH (ref 0–44)
AST: 144 U/L — ABNORMAL HIGH (ref 15–41)
Albumin: 3.7 g/dL (ref 3.5–5.0)
Alkaline Phosphatase: 79 U/L (ref 38–126)
Anion gap: 6 (ref 5–15)
BUN: 25 mg/dL — ABNORMAL HIGH (ref 6–20)
CO2: 24 mmol/L (ref 22–32)
Calcium: 8.3 mg/dL — ABNORMAL LOW (ref 8.9–10.3)
Chloride: 109 mmol/L (ref 98–111)
Creatinine, Ser: 0.93 mg/dL (ref 0.44–1.00)
GFR, Estimated: >60 mL/min (ref >60)
Glucose, Bld: 142 mg/dL — ABNORMAL HIGH (ref 70–99)
Potassium: 4.2 mmol/L (ref 3.5–5.1)
Sodium: 139 mmol/L (ref 135–145)
Total Bilirubin: 0.3 mg/dL (ref 0.3–1.2)
Total Protein: 6.9 g/dL (ref 6.5–8.1)

## 2020-06-02 LAB — RESPIRATORY PANEL BY RT PCR (FLU A&B, COVID)
Influenza A by PCR: NEGATIVE
Influenza B by PCR: NEGATIVE
SARS Coronavirus 2 by RT PCR: NEGATIVE

## 2020-06-02 LAB — POC URINE PREG, ED: Preg Test, Ur: NEGATIVE

## 2020-06-02 LAB — MAGNESIUM: Magnesium: 2.2 mg/dL (ref 1.7–2.4)

## 2020-06-02 MED ORDER — ONDANSETRON HCL 4 MG/2ML IJ SOLN: 4.0000 mg | Freq: Once | INTRAMUSCULAR | Status: AC

## 2020-06-02 MED ORDER — APIXABAN 5 MG PO TABS
5.0000 mg | ORAL_TABLET | Freq: Two times a day (BID) | ORAL | Status: DC
  Administered 2020-06-02 (×3): 5 mg via ORAL
  Filled 2020-06-02 (×3): qty 1

## 2020-06-02 MED ORDER — IBUPROFEN 400 MG PO TABS
400.0000 mg | ORAL_TABLET | Freq: Four times a day (QID) | ORAL | Status: AC | PRN
  Administered 2020-06-02: 400 mg via ORAL
  Filled 2020-06-02: qty 1

## 2020-06-02 MED ORDER — ADENOSINE 6 MG/2ML IV SOLN
12.0000 mg | Freq: Once | INTRAVENOUS | Status: AC
  Administered 2020-06-02: 12 mg via INTRAVENOUS

## 2020-06-02 MED ORDER — MIDAZOLAM HCL 5 MG/5ML IJ SOLN: 2.5000 mg | Freq: Once | INTRAMUSCULAR | Status: AC

## 2020-06-02 MED ORDER — METOPROLOL SUCCINATE ER 25 MG PO TB24
25.0000 mg | ORAL_TABLET | Freq: Every day | ORAL | Status: DC
  Administered 2020-06-02 – 2020-06-03 (×2): 25 mg via ORAL
  Filled 2020-06-02 (×2): qty 1

## 2020-06-02 NOTE — H&P (Signed)
History and Physical  Tonya Gardner ENI:[id number] DOB: November 11, 1970 DOA: 06/01/2020  Referring physician: Dione Booze, MD PCP: Patient, No Pcp Per  Patient coming from: Home  Chief Complaint: Palpitations  HPI: Tonya Gardner is a 49 y.o. 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  Palpitation was described to feel like heart is jumping out of her chest.  This self resolved within 15 to 20 minutes.  Patient went to relax in bed and shortly after this, there was a recurrence of same sensation and she decided to go to the ED for further evaluation and management.  ED Course:  In the emergency department, she was tachycardic with HR in the 200s on arrival, she was also tachypneic.  Work-up in the ED showed normal CBC and BMP except for hyperglycemia, AST 144, ALP 108.  SARS coronavirus 2 was negative.  Chest x-ray showed cardiomegaly with mild vascular congestion but no features of frank edema.  Patient was reported to have supraventricular tachycardia, carotid sinus massage was attempted with no improvement, patient was then given IV adenosine 6 mg which slowed HR down to 150 at which point patient appears to convert to atrial fibrillation, IV adenosine 12 mg was given without improvement.  Emergent cardioversion was planned, she was given midazolam, but she converted to normal sinus rhythm prior to attempting cardioversion.  She was started on Eliquis.  Cardiology (Dr. Hyacinth Meeker) at [facility] was consulted and recommended admitting patient here at [facility] with consult for cardiology in the morning.  Review of Systems: Constitutional: Negative for chills and fever.  HENT: Negative for ear pain and sore  throat.   Eyes: Negative for pain and visual disturbance.  Respiratory: Negative for cough, chest tightness and shortness of breath.   Cardiovascular: Negative for chest pain and positive for palpitations.  Gastrointestinal: Negative for abdominal pain and vomiting.  Endocrine: Negative for polyphagia and polyuria.  Genitourinary: Negative for decreased urine volume, dysuria Musculoskeletal: Negative for arthralgias and back pain.  Skin: Negative for color change and rash.  Allergic/Immunologic: Negative for immunocompromised state.  Neurological: Positive for dizziness, ringing in the ears.  Negative for tremors, syncope, speech difficulty Hematological: Does not bruise/bleed easily.  All other systems reviewed and are negative    Past Medical History:  Diagnosis Date  . CHF (congestive heart failure) (HCC) 2002   with pregnancy  . Headache   . Presence of permanent cardiac pacemaker    off for 2 years  . Seizures (HCC) 2002   due to eclampsia  . Sinus bradycardia   . Tetralogy of Fallot s/p repair 1975   Past Surgical History:  Procedure Laterality Date  . ABDOMINAL HYSTERECTOMY N/A 08/12/2014   Procedure: HYSTERECTOMY ABDOMINAL;  Surgeon: Jeani Hawking, MD;  Location: [facility];  Service: Gynecology;  Laterality: N/A;  . CESAREAN SECTION    . LAPAROSCOPIC ASSISTED VAGINAL HYSTERECTOMY N/A 08/12/2014   Procedure: ATTEMPTED LAPAROSCOPIC ASSISTED VAGINAL HYSTERECTOMY;  Surgeon: Jeani Hawking, MD;  Location: [facility];  Service: Gynecology;  Laterality: N/A;  . PACEMAKER INSERTION     [employer]   . SALPINGOOPHORECTOMY Bilateral 08/12/2014   Procedure: SALPINGO OOPHORECTOMY;  Surgeon: Jeani Hawking, MD;  Location: [facility];  Service: Gynecology;  Laterality: Bilateral;  . TETRALOGY  OF FALLOT REPAIR    . TONSILLECTOMY      Social History:  reports that she has quit smoking. She has never used smokeless tobacco. She reports current alcohol use. She reports that she does not  use drugs.   Allergies  Allergen Reactions  . Sulfonamide Derivatives Hives  . Latex Rash    History reviewed. No pertinent family history.    Prior to Admission medications   Medication Sig Start Date End Date Taking? Authorizing Provider  cetirizine (ZYRTEC) 10 MG tablet Take 10 mg by mouth daily as needed. For allergies    [provider]  ibuprofen (ADVIL,MOTRIN) 600 MG tablet Take 1 tablet (600 mg total) by mouth every 8 (eight) hours as needed for mild pain or moderate pain. 06/28/18   Azalia Bilis, MD  ondansetron (ZOFRAN ODT) 8 MG disintegrating tablet Take 1 tablet (8 mg total) by mouth every 8 (eight) hours as needed for nausea or vomiting. 06/28/18   Azalia Bilis, MD  Vitamin D, Ergocalciferol, (DRISDOL) 1.25 MG (50000 UT) CAPS capsule Take 50,000 Units by mouth every 7 (seven) days.    [provider]    Physical Exam: BP 107/90   Pulse 87   Temp 98 F (36.7 C) (Oral)   Resp (!) 25   Ht 5\' 2"  (1.575 m)   Wt 81.6 kg   LMP 07/23/2014 (Approximate)   SpO2 99%   BMI 32.92 kg/m   . General: 49 y.o. year-old female well developed well nourished in no acute distress.  Alert and oriented x3. 54 HEENT: NCAT, EOMI . Neck: Supple, trachea media . Cardiovascular: Tachycardic.  Regular rate and rhythm with no rubs or gallops.  No thyromegaly or JVD noted.  2/4 pulses in all 4 extremities. Marland Kitchen Respiratory: Tachypneic.  Clear to auscultation with no wheezes or rales.  . Abdomen: Soft nontender nondistended with normal bowel sounds x4 quadrants. . Muskuloskeletal: No cyanosis, clubbing or edema noted bilaterally . Neuro: CN II-XII intact, strength, sensation, reflexes . Skin: No ulcerative lesions noted or rashes . Psychiatry: Judgement and insight appear normal. Mood is appropriate for condition and setting          Labs on Admission:  Basic Metabolic Panel: Recent Labs  Lab 06/02/20 0150  NA 139  K 4.2  CL 109  CO2 24  GLUCOSE 142*  BUN 25*    CREATININE 0.93  CALCIUM 8.3*  MG 2.2   Liver Function Tests: Recent Labs  Lab 06/02/20 0150  AST 144*  ALT 108*  ALKPHOS 79  BILITOT 0.3  PROT 6.9  ALBUMIN 3.7   No results for input(s): LIPASE, AMYLASE in the last 168 hours. No results for input(s): AMMONIA in the last 168 hours. CBC: Recent Labs  Lab 06/02/20 0150  WBC 8.8  NEUTROABS 7.0  HGB 12.4  HCT 38.9  MCV 88.6  PLT 266   Cardiac Enzymes: No results for input(s): CKTOTAL, CKMB, CKMBINDEX, TROPONINI in the last 168 hours.  BNP (last 3 results) No results for input(s): BNP in the last 8760 hours.  ProBNP (last 3 results) No results for input(s): PROBNP in the last 8760 hours.  CBG: No results for input(s): GLUCAP in the last 168 hours.  Radiological Exams on Admission: DG Chest Port 1 View  Result Date: 06/02/2020 CLINICAL DATA:  Palpitations, diaphoresis and dizziness EXAM: PORTABLE CHEST 1 VIEW COMPARISON:  Radiograph 06/23/2013 FINDINGS: Cardiomegaly is likely similar to prior counting for differences in technique with a pacer pack overlying the  left chest wall leads in stable position at the cardiac apex and right atrium. External support devices, pacer pads and telemetry leads overlie the chest. Low volumes with streaky opacities favoring atelectasis with some mild vascular congestion without features of frank edema. No focal consolidation. No pneumothorax or visible effusion. No acute osseous or soft tissue abnormality. IMPRESSION: 1. Low volumes with streaky opacities favoring atelectasis. 2. Cardiomegaly with mild vascular congestion but no features of frank edema. Electronically Signed   By: Kreg Shropshire M.D.   On: 06/02/2020 00:32    EKG: I independently viewed the EKG done and my findings are as followed: Initial EKG showed SVT at a rate of 231 bpm with RBBB.  Subsequent EKG showed sinus tachycardia at rate of 103 BPM   Assessment/Plan Present on Admission: . Paroxysmal A-fib (HCC)  Principal  Problem:   Paroxysmal A-fib (HCC) Active Problems:   Supraventricular tachycardia (HCC)   History of CHF (congestive heart failure)   Hyperglycemia   Obesity (BMI 30-39.9)  Supraventricular tachycardia/paroxysmal atrial fibrillation-resolved Patient also has been back to sinus rhythm Continue telemetry Patient was started on Eliquis, we will continue with same at this time Echocardiogram will be done in the morning to rule out any structural or valvular abnormality considering the history of tetralogy of Fallot Cardiology will be consulted for further recommendation in the morning  Hyperglycemia with no known history of type 2 diabetes mellitus BG 142, this is possibly reactive, continue to monitor blood glucose level  Obesity (BMI 32.92)  Patient will be counseled on lifestyle modification  History of CHF-peripartum cardiomyopathy Stable, she had a pacemaker placement which is no longer functioning   DVT prophylaxis: Eliquis  Code Status: Full code  Family Communication: Sister at bedside (all questions answered satisfaction)  Disposition Plan:  Patient is from:                        home Anticipated DC to:                   home Anticipated DC date:               1 day Anticipated DC barriers:          Patient is not stable at this time due to SVT/paroxysmal A. fib which require further work-up as well as cardiology consult with recommendation prior to discharge  Consults called: Cardiology  Admission status: Observation    Frankey Shown MD [facility]  06/02/2020, 6:48 AM

## 2020-06-02 NOTE — Consult Note (Addendum)
Cardiology Consultation:   Patient ID: Tonya Gardner MRN: [id number]; DOB: 05-02-1971  Admit date: 06/01/2020 Date of Consult: 06/02/2020  Primary Care Provider: Patient, No Pcp Per [facility] Cardiologist: No primary care provider on file.   [facility] Electrophysiologist:  Lewayne Bunting, MD     Patient Profile:   Tonya Gardner is a 49 y.o. female with a hx of pacemaker for bradycardia who is being seen today for the evaluation of Afib at the request of Dr. Thomes Dinning.  History of Present Illness:   Tonya Gardner with history of tetrology of fallot repair 1975, pacemaker for symptomatic bradycardia 2002 in the setting of eclampsia resolved and no battery replacement when it reached end of life 2012.  Patient came in yesterday in what looked like SVT-dizzy, presyncopal, diaphoretic, slowed with Adenosine but converted spontaneously before cardioversion needed. She has since had several short runs of tachycardia. She denies excessive caffeine use, dietary supplements or decongestants. 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   Past Medical History:  Diagnosis Date  . CHF (congestive heart failure) (HCC) 2002   with pregnancy  . Headache   . Presence of permanent cardiac pacemaker    off for 2 years  . Seizures (HCC) 2002   due to eclampsia  . Sinus bradycardia   . Tetralogy of Fallot s/p repair 1975    Past Surgical History:  Procedure Laterality Date  . ABDOMINAL HYSTERECTOMY N/A 08/12/2014   Procedure: HYSTERECTOMY ABDOMINAL;  Surgeon: Jeani Hawking, MD;  Location: [facility];  Service: Gynecology;  Laterality: N/A;  . CESAREAN SECTION    . LAPAROSCOPIC ASSISTED VAGINAL HYSTERECTOMY N/A 08/12/2014   Procedure: ATTEMPTED LAPAROSCOPIC ASSISTED VAGINAL HYSTERECTOMY;  Surgeon: Jeani Hawking, MD;  Location: [facility];  Service: Gynecology;  Laterality: N/A;  . PACEMAKER INSERTION     [employer]tronic   . SALPINGOOPHORECTOMY Bilateral 08/12/2014   Procedure: SALPINGO  OOPHORECTOMY;  Surgeon: Jeani Hawking, MD;  Location: [facility];  Service: Gynecology;  Laterality: Bilateral;  . TETRALOGY OF FALLOT REPAIR    . TONSILLECTOMY       Home Medications:  Prior to Admission medications   Medication Sig Start Date End Date Taking? Authorizing Provider  cetirizine (ZYRTEC) 10 MG tablet Take 10 mg by mouth daily as needed. For allergies   Yes [provider]  ibuprofen (ADVIL,MOTRIN) 600 MG tablet Take 1 tablet (600 mg total) by mouth every 8 (eight) hours as needed for mild pain or moderate pain. 06/28/18  Yes Azalia Bilis, MD  ondansetron (ZOFRAN ODT) 8 MG disintegrating tablet Take 1 tablet (8 mg total) by mouth every 8 (eight) hours as needed for nausea or vomiting. Patient not taking: Reported on 06/02/2020 06/28/18   Azalia Bilis, MD    Inpatient Medications: Scheduled Meds: . apixaban  5 mg Oral BID   Continuous Infusions:  PRN Meds:   Allergies:    Allergies  Allergen Reactions  . Sulfonamide Derivatives Hives  . Latex Rash    Social History:   Social History   Socioeconomic History  . Marital status: Married    Spouse name: Not on file  . Number of children: Not on file  . Years of education: Not on file  . Highest education level: Not on file  Occupational History  . Not on file  Tobacco Use  . Smoking status: Former Games developer  . Smokeless tobacco: Never Used  Vaping Use  . Vaping Use: Never used  Substance and Sexual Activity  .  Alcohol use: Yes    Comment: occasionally  . Drug use: No  . Sexual activity: Yes    Birth control/protection: None  Other Topics Concern  . Not on file  Social History Narrative  . Not on file   Social Determinants of Health   Financial Resource Strain:   . Difficulty of Paying Living Expenses: Not on file  Food Insecurity:   . Worried About Programme researcher, broadcasting/film/video in the Last Year: Not on file  . Ran Out of Food in the Last Year: Not on file  Transportation Needs:   . Lack of  Transportation (Medical): Not on file  . Lack of Transportation (Non-Medical): Not on file  Physical Activity:   . Days of Exercise per Week: Not on file  . Minutes of Exercise per Session: Not on file  Stress:   . Feeling of Stress : Not on file  Social Connections:   . Frequency of Communication with Friends and Family: Not on file  . Frequency of Social Gatherings with Friends and Family: Not on file  . Attends Religious Services: Not on file  . Active Member of Clubs or Organizations: Not on file  . Attends Banker Meetings: Not on file  . Marital Status: Not on file  Intimate Partner Violence:   . Fear of Current or Ex-Partner: Not on file  . Emotionally Abused: Not on file  . Physically Abused: Not on file  . Sexually Abused: Not on file    Family History:     History reviewed. No pertinent family history.   ROS:  Please see the history of present illness.  Review of Systems  Constitutional: Positive for diaphoresis.  HENT: Negative.   Eyes: Negative.   Cardiovascular: Positive for near-syncope and palpitations.  Respiratory: Negative.   Hematologic/Lymphatic: Negative.   Musculoskeletal: Negative.  Negative for joint pain.  Gastrointestinal: Positive for nausea.  Genitourinary: Negative.   Neurological: Negative.     All other ROS reviewed and negative.     Physical Exam/Data:   Vitals:   06/02/20 0615 06/02/20 0630 06/02/20 0645 06/02/20 0700  BP: 99/79 107/90 101/75 109/84  Pulse: 83 87 86 82  Resp: 16 (!) 25 19 18   Temp:    97.9 F (36.6 C)  TempSrc:    Oral  SpO2: 99% 99% 99% 97%  Weight:      Height:       No intake or output data in the 24 hours ending 06/02/20 0858 Last 3 Weights 06/01/2020 06/28/2018 01/17/2015  Weight (lbs) 180 lb 200 lb 185 lb  Weight (kg) 81.647 kg 90.719 kg 83.915 kg     Body mass index is 32.92 kg/m.  General:  Obese, in no acute distress HEENT: normal Lymph: no adenopathy Neck: no JVD Endocrine:  No  thryomegaly Vascular: No carotid bruits; FA pulses 2+ bilaterally without bruits  Cardiac:  normal S1, S2; RRR; no murmur   Lungs:  Crackles at the bases Abd: soft, nontender, no hepatomegaly  Ext: no edema Musculoskeletal:  No deformities, BUE and BLE strength normal and equal Skin: warm and dry  Neuro:  CNs 2-12 intact, no focal abnormalities noted Psych:  Normal affect   EKG:  The EKG was personally reviewed and demonstrates: initial EKG looks like SVT with RBBB, F/U NSR with RBBB LAFB Telemetry:  Telemetry was personally reviewed and demonstrates:  NSR with runs SVT vs Afib  Relevant CV Studies:    Laboratory Data:  High Sensitivity Troponin:  No results for input(s): TROPONINIHS in the last 720 hours.   Chemistry Recent Labs  Lab 06/02/20 0150  NA 139  K 4.2  CL 109  CO2 24  GLUCOSE 142*  BUN 25*  CREATININE 0.93  CALCIUM 8.3*  GFRNONAA >60  ANIONGAP 6    Recent Labs  Lab 06/02/20 0150  PROT 6.9  ALBUMIN 3.7  AST 144*  ALT 108*  ALKPHOS 79  BILITOT 0.3   Hematology Recent Labs  Lab 06/02/20 0150  WBC 8.8  RBC 4.39  HGB 12.4  HCT 38.9  MCV 88.6  MCH 28.2  MCHC 31.9  RDW 13.7  PLT 266   BNPNo results for input(s): BNP, PROBNP in the last 168 hours.  DDimer No results for input(s): DDIMER in the last 168 hours.   Radiology/Studies:  DG Chest Port 1 View  Result Date: 06/02/2020 CLINICAL DATA:  Palpitations, diaphoresis and dizziness EXAM: PORTABLE CHEST 1 VIEW COMPARISON:  Radiograph 06/23/2013 FINDINGS: Cardiomegaly is likely similar to prior counting for differences in technique with a pacer pack overlying the left chest wall leads in stable position at the cardiac apex and right atrium. External support devices, pacer pads and telemetry leads overlie the chest. Low volumes with streaky opacities favoring atelectasis with some mild vascular congestion without features of frank edema. No focal consolidation. No pneumothorax or visible effusion. No  acute osseous or soft tissue abnormality. IMPRESSION: 1. Low volumes with streaky opacities favoring atelectasis. 2. Cardiomegaly with mild vascular congestion but no features of frank edema. Electronically Signed   By: Kreg Shropshire M.D.   On: 06/02/2020 00:32     Assessment and Plan:   1. SVT vs Atrial fibrillation with RVR converted to NSR spontaenously with recurrent short runs. Will review with Dr. Tenny Craw. 2. History of pacemaker for symptomatic bradycardia in the setting of ecclampsia after the birth of her child-resolved and battery not replaced in 2012 at end of life because bradycarida resolved 3. History of tetrology of fallot repair 1975-no regular f/u 4. Obesity      CHA2DS2-VASc Score = 2  This indicates a 2.2% annual risk of stroke. The patient's score is based upon: CHF History: 0 HTN History: 1 Diabetes History: 0 Stroke History: 0 Vascular Disease History: 0 Age Score: 0 Gender Score: 1     For questions or updates, please contact [facility] Please consult [email or web address] for contact info under    Signed, Jacolyn Reedy, PA-C  06/02/2020 8:58 AM   Patient seen and examined   I agree with findings as noted above by Leda Gauze Pt is a 49 yo with hx of Tetralogy of Fallot (s/p repair at age 71.5 years)  Also hx of bradycardia in 2002 (around time of preeclampsia/eclampsia)   Pt had pacer placed   No recurrence    Pt last seen in cardiology in 2012 Rosette Reveal )   Since then she has been doing OK  Her breathing has been OK   Notes rare edema of hands/feet  Denies CP    Just got back from [place] D/C   Able to walk without problems  Last night around 10 PM heart started racing.  Patient became warm, dizzy   Presented to [facility] ED   EKG at 11:45 showed SVT   231 bpm   RBBB Pt given adenosine with slowing then conversion.   Strips not available If indeed there was slowing it makes it more consistent with atrial flutter with 1:1 conduction.  Other possiblity is AVNRT  ON  exam:   Pt currently comfortabe Lungs are CTA Neck:  JVP is normal Cardiac exam:   RRR  Gr II/VI systolic murmur RUSB Abd is supple  No hepatomegaly Ext are without edema  Echo today shows moderately depressed LVEF, mod/severe RV dysfunction No PS  Mild/mod PI     Impression  1  SVT I have reviewed with Rosette Reveal  Most likely atrial flutter with 1:1 conduction  Would reocmm Toprol XL 25 mg    SInce this is first spell and she was so symptomatic would not start anticoagulation for now  2   LV/RV dysfunction   Pt had remote echo  Unable to find in Epic LV dysfunction not expected as part of TOF ? If represents stunning from signif tachycardia 12 hours prior    Note the pt denies SOB prior   No illness WOuld repeat echo  (limited) with Definity in AM   3   Hx bradycardia    Occurred in 2002   Followed in EP until 2012     Pacer still present but not being used.  4  Tetralogy of Fallot  S/p repair as small child [facility])

## 2020-06-02 NOTE — ED Provider Notes (Signed)
[facility] EMERGENCY DEPARTMENT Provider Note   CSN: [id number] Arrival date & time: 06/01/20  2310   History Chief Complaint  Patient presents with  . Palpitations    Tonya Gardner is a 49 y.o. female.  The history is provided by the patient.  Palpitations She has history of tetralogy of Fallot, hypertension, heart failure during pregnancy with pacemaker inserted but no longer active and comes in with palpitations which started about 9:30 PM.  They have been intermittent.  She states it feels like her heart is jumping out of her chest.  There is associated dyspnea, nausea, diaphoresis.  Nothing makes it better, nothing makes it worse.  She cannot recall any episodes like this before.  Past Medical History:  Diagnosis Date  . CHF (congestive heart failure) (HCC) 2002   with pregnancy  . Headache   . Presence of permanent cardiac pacemaker    off for 2 years  . Seizures (HCC) 2002   due to eclampsia  . Sinus bradycardia   . Tetralogy of Fallot s/p repair 1975    Patient Active Problem List   Diagnosis Date Noted  . Menorrhagia 08/12/2014  . ESSENTIAL HYPERTENSION, BENIGN 10/16/2009  . CARDIAC PACEMAKER IN SITU 10/16/2009  . SINUS BRADYCARDIA 10/13/2009  . CHF 10/13/2009    Past Surgical History:  Procedure Laterality Date  . ABDOMINAL HYSTERECTOMY N/A 08/12/2014   Procedure: HYSTERECTOMY ABDOMINAL;  Surgeon: Jeani Hawking, MD;  Location: [facility];  Service: Gynecology;  Laterality: N/A;  . CESAREAN SECTION    . LAPAROSCOPIC ASSISTED VAGINAL HYSTERECTOMY N/A 08/12/2014   Procedure: ATTEMPTED LAPAROSCOPIC ASSISTED VAGINAL HYSTERECTOMY;  Surgeon: Jeani Hawking, MD;  Location: [facility];  Service: Gynecology;  Laterality: N/A;  . PACEMAKER INSERTION     [employer]tr[employer]   . SALPINGOOPHORECTOMY Bilateral 08/12/2014   Procedure: SALPINGO OOPHORECTOMY;  Surgeon: Jeani Hawking, MD;  Location: [facility];  Service: Gynecology;  Laterality: Bilateral;  . TETRALOGY OF FALLOT  REPAIR    . TONSILLECTOMY       OB History    Gravida  3   Para      Term      Preterm      AB      Living        SAB      TAB      Ectopic      Multiple      Live Births  2           History reviewed. No pertinent family history.  Social History   Tobacco Use  . Smoking status: Former Games developer  . Smokeless tobacco: Never Used  Vaping Use  . Vaping Use: Never used  Substance Use Topics  . Alcohol use: Yes    Comment: occasionally  . Drug use: No    Home Medications Prior to Admission medications   Medication Sig Start Date End Date Taking? Authorizing Provider  cetirizine (ZYRTEC) 10 MG tablet Take 10 mg by mouth daily as needed. For allergies    [provider]  ibuprofen (ADVIL,MOTRIN) 600 MG tablet Take 1 tablet (600 mg total) by mouth every 8 (eight) hours as needed for mild pain or moderate pain. 06/28/18   Azalia Bilis, MD  ondansetron (ZOFRAN ODT) 8 MG disintegrating tablet Take 1 tablet (8 mg total) by mouth every 8 (eight) hours as needed for nausea or vomiting. 06/28/18   Azalia Bilis, MD  Vitamin D, Ergocalciferol, (DRISDOL) 1.25 MG (50000 UT) CAPS capsule  Take 50,000 Units by mouth every 7 (seven) days.    [provider]    Allergies    Sulfonamide derivatives and Latex  Review of Systems   Review of Systems  Cardiovascular: Positive for palpitations.  All other systems reviewed and are negative.   Physical Exam Updated Vital Signs BP 82/50   Pulse 215   Temp 98 F (36.7 C) (Oral)   Resp (!) 26   Ht 5\' 2"  (1.575 m)   Wt 81.6 kg   LMP 07/23/2014 (Approximate)   SpO2 98%   BMI 32.92 kg/m   Physical Exam Vitals and nursing note reviewed.   49 year old female, appears somewhat uncomfortable and clammy. Vital signs are significant for rapid heart rate, rapid respiratory rate, low blood pressure. Oxygen saturation is 98%, which is normal. Head is normocephalic and atraumatic. PERRLA, EOMI. Oropharynx is  clear. Neck is nontender and supple without adenopathy or JVD. Back is nontender and there is no CVA tenderness. Lungs are clear without rales, wheezes, or rhonchi. Chest is nontender. Heart is tachycardic without murmur. Abdomen is soft, flat, nontender without masses or hepatosplenomegaly and peristalsis is normoactive. Extremities have no cyanosis or edema, full range of motion is present. Skin is warm and dry without rash. Neurologic: Mental status is normal, cranial nerves are intact, there are no motor or sensory deficits.  ED Results / Procedures / Treatments   Labs (all labs ordered are listed, but only abnormal results are displayed) Labs Reviewed  CBC WITH DIFFERENTIAL/PLATELET  COMPREHENSIVE METABOLIC PANEL    EKG EKG Interpretation  Date/Time:  Sunday June 01 2020 23:42:53 EDT Ventricular Rate:  231 PR Interval:    QRS Duration: 145 QT Interval:  228 QTC Calculation: 447 R Axis:   -140 Text Interpretation: Supraventricular tachycardia Right bundle branch block Diffuse REPOLARIZATION ABNORMALITY probably rate-related When compared with ECG of 08/12/2014, Supraventricular tachycardia has replaced Sinus rhythm REPOLARIZATION ABNORMALITY is now present - probably rate-related Confirmed by 08/14/2014 (Dione Booze) on 06/02/2020 12:07:18 AM   EKG Interpretation  Date/Time:  Sunday June 01 2020 23:54:43 EDT Ventricular Rate:  103 PR Interval:    QRS Duration: 152 QT Interval:  332 QTC Calculation: 435 R Axis:   101 Text Interpretation: Sinus tachycardia RBBB and LPFB When compared with ECG of EARLIER SAME DATE Sinus rhythm has replaced Supraventricular tachycardia REPOLARIZATION ABNORMALITY is no longer present Confirmed by 08-15-1992 (Dione Booze) on 06/02/2020 12:18:11 AM       Radiology No results found.  Procedures .Cardioversion  Date/Time: 06/02/2020 12:09 AM Performed by: 08/02/2020, MD Authorized by: Dione Booze, MD   Consent:    Consent obtained:   Verbal   Consent given by:  Patient   Risks discussed:  Death and induced arrhythmia   Alternatives discussed:  Observation Pre-procedure details:    Cardioversion basis:  Emergent   Rhythm:  Supraventricular tachycardia   Electrode placement:  Anterior-posterior Patient sedated: No Attempt one:    Cardioversion mode attempt one: Adenosine 6 mg.   Cardioversion outcome attempt one: Heart rate slowed to 150, appeared to be irregular, then returned to prior rhythm. Attempt two:    Shock (joules) attempt two: Adenosine 12 mg.   Shock outcome:  No change in rhythm   CRITICAL CARE Performed by: Dione Booze Total critical care time: 60 minutes Critical care time was exclusive of separately billable procedures and treating other patients. Critical care was necessary to treat or prevent imminent or life-threatening deterioration. Critical care was time  spent personally by me on the following activities: development of treatment plan with patient and/or surrogate as well as nursing, discussions with consultants, evaluation of patient's response to treatment, examination of patient, obtaining history from patient or surrogate, ordering and performing treatments and interventions, ordering and review of laboratory studies, ordering and review of radiographic studies, pulse oximetry and re-evaluation of patient's condition.  Medications Ordered in ED Medications  adenosine (ADENOCARD) 6 MG/2ML injection 6 mg (has no administration in time range)  midazolam (VERSED) 5 MG/5ML injection 2.5 mg (2.5 mg Intravenous Given 06/02/20 0002)  ondansetron (ZOFRAN) injection 4 mg (4 mg Intravenous Given 06/02/20 0001)    ED Course  I have reviewed the triage vital signs and the nursing notes.  Pertinent labs & imaging results that were available during my care of the patient were reviewed by me and considered in my medical decision making (see chart for details).  MDM  Rules/Calculators/A&P Supraventricular tachycardia.  Rhythm appears regular.  Carotid sinus massage was attempted without any benefit.  IV was started and she was given adenosine 6 mg which slowed the heart rate down to about 150 at which point some irregularity was noted suggesting this is atrial fibrillation.  She was given 12 mg of adenosine with no improvement.  Blood pressure was low and it was felt that emergent cardioversion was appropriate.  She was given midazolam for sedation, but converted to sinus rhythm before I was actually able to cardiovert her.  Following conversion to sinus rhythm, blood pressure has come up and she is feeling somewhat better.  Old records are reviewed, and she has no relevant past visits.  Prior history of tetralogy of Fallot is confirmed.  She is started on apixaban.  She is observed in the emergency department and has not had recurrence of atrial fibrillation.  Screening labs are unremarkable.  However, given her heart rate during the episode of atrial fibrillation, and accompanying hemodynamic instability, I do not feel that she is safe for discharge.  Case is discussed with Dr. Hyacinth Meeker of cardiology service and decision is made to admit the patient here and obtain cardiology consultation in the morning.  Case has been discussed with Dr. Thomes Dinning of [facility], who agrees to admit the patient.  CHA2DS2/VAS Stroke Risk Points     3 >= 2 Points: High Risk  1 - 1.99 Points: Medium Risk  0 Points: Low Risk    Last Change: N/A    Score is 3 - one point each for female sex, history of hypertension, history of heart failure.    This score determines the patient's risk of having a stroke if the  patient has atrial fibrillation.     Final Clinical Impression(s) / ED Diagnoses Final diagnoses:  Paroxysmal atrial fibrillation with rapid ventricular response [facility])  Tetralogy of Fallot    Rx / DC Orders ED Discharge Orders    None       Dione Booze,  MD 06/02/20 [phone]

## 2020-06-02 NOTE — Progress Notes (Signed)
*  PRELIMINARY RESULTS* Echocardiogram 2D Echocardiogram has been performed.  Tonya Gardner 06/02/2020, 11:13 AM

## 2020-06-02 NOTE — Progress Notes (Signed)
Per HPI: Tonya Gardner is a 49 y.o. 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  Palpitation was described to feel like heart is jumping out of her chest.  This self resolved within 15 to 20 minutes.  Patient went to relax in bed and shortly after this, there was a recurrence of same sensation and she decided to go to the ED for further evaluation and management.  10/11: Patient has been admitted with supraventricular tachycardia/paroxysmal atrial fibrillation which has now resolved.  She appears to be in stable condition with mild chest discomfort, but no palpitations or dyspnea or other issues noted.  She has been started on Eliquis and seen by cardiology with full recommendations still pending.  She will need telemetry monitoring overnight.  Anticipate discharge by 10/12 if she remains in stable condition.  2D echocardiogram ordered and pending.  Husband at bedside notified.  Total CARE time: 25 minutes.

## 2020-06-03 ENCOUNTER — Observation Stay (HOSPITAL_BASED_OUTPATIENT_CLINIC_OR_DEPARTMENT_OTHER): Payer: BC Managed Care – PPO

## 2020-06-03 DIAGNOSIS — I480 Paroxysmal atrial fibrillation: Secondary | ICD-10-CM

## 2020-06-03 DIAGNOSIS — I4892 Unspecified atrial flutter: Secondary | ICD-10-CM

## 2020-06-03 DIAGNOSIS — Z8774 Personal history of (corrected) congenital malformations of heart and circulatory system: Secondary | ICD-10-CM | POA: Diagnosis not present

## 2020-06-03 DIAGNOSIS — I429 Cardiomyopathy, unspecified: Secondary | ICD-10-CM

## 2020-06-03 DIAGNOSIS — I471 Supraventricular tachycardia: Secondary | ICD-10-CM | POA: Diagnosis not present

## 2020-06-03 LAB — COMPREHENSIVE METABOLIC PANEL
ALT: 71 U/L — ABNORMAL HIGH (ref 0–44)
AST: 37 U/L (ref 15–41)
Albumin: 3.6 g/dL (ref 3.5–5.0)
Alkaline Phosphatase: 57 U/L (ref 38–126)
Anion gap: 8 (ref 5–15)
BUN: 23 mg/dL — ABNORMAL HIGH (ref 6–20)
CO2: 24 mmol/L (ref 22–32)
Calcium: 8.9 mg/dL (ref 8.9–10.3)
Chloride: 109 mmol/L (ref 98–111)
Creatinine, Ser: 0.74 mg/dL (ref 0.44–1.00)
GFR, Estimated: >60 mL/min (ref >60)
Glucose, Bld: 100 mg/dL — ABNORMAL HIGH (ref 70–99)
Potassium: 3.9 mmol/L (ref 3.5–5.1)
Sodium: 141 mmol/L (ref 135–145)
Total Bilirubin: 0.5 mg/dL (ref 0.3–1.2)
Total Protein: 6.2 g/dL — ABNORMAL LOW (ref 6.5–8.1)

## 2020-06-03 LAB — CBC
HCT: 38.5 % (ref 36.0–46.0)
Hemoglobin: 12.2 g/dL (ref 12.0–15.0)
MCH: 28 pg (ref 26.0–34.0)
MCHC: 31.7 g/dL (ref 30.0–36.0)
MCV: 88.5 fL (ref 80.0–100.0)
Platelets: 223 10*3/uL (ref 150–400)
RBC: 4.35 MIL/uL (ref 3.87–5.11)
RDW: 13.9 % (ref 11.5–15.5)
WBC: 5.8 10*3/uL (ref 4.0–10.5)
nRBC: 0 % (ref 0.0–0.2)

## 2020-06-03 LAB — APTT: aPTT: 31 seconds (ref 24–36)

## 2020-06-03 LAB — ECHOCARDIOGRAM LIMITED
Height: 62 in
Weight: 2880 oz

## 2020-06-03 LAB — PHOSPHORUS: Phosphorus: 3.6 mg/dL (ref 2.5–4.6)

## 2020-06-03 LAB — PROTIME-INR
INR: 1.3 — ABNORMAL HIGH (ref 0.8–1.2)
Prothrombin Time: 15.3 seconds — ABNORMAL HIGH (ref 11.4–15.2)

## 2020-06-03 LAB — HIV ANTIBODY (ROUTINE TESTING W REFLEX): HIV Screen 4th Generation wRfx: NONREACTIVE

## 2020-06-03 LAB — MAGNESIUM: Magnesium: 2.1 mg/dL (ref 1.7–2.4)

## 2020-06-03 MED ORDER — PERFLUTREN LIPID MICROSPHERE
1.0000 mL | INTRAVENOUS | Status: AC | PRN
  Administered 2020-06-03: 2 mL via INTRAVENOUS
  Filled 2020-06-03: qty 10

## 2020-06-03 MED ORDER — LORATADINE 10 MG PO TABS
10.0000 mg | ORAL_TABLET | Freq: Every day | ORAL | Status: DC
  Administered 2020-06-03: 10 mg via ORAL
  Filled 2020-06-03: qty 1

## 2020-06-03 MED ORDER — METOPROLOL SUCCINATE ER 25 MG PO TB24: 25.0000 mg | ORAL_TABLET | Freq: Every day | ORAL | 3 refills | Status: DC

## 2020-06-03 NOTE — Progress Notes (Signed)
*  PRELIMINARY RESULTS* Echocardiogram 2D Echocardiogram with definity has been performed.  Tonya Gardner 06/03/2020, 12:11 PM

## 2020-06-03 NOTE — Progress Notes (Signed)
Progress Note  Patient Name: Tonya Gardner Date of Encounter: 06/03/2020  Primary Cardiologist: Dietrich Pates, MD  Subjective   No chest pain or prolonged palpitations under observation.  Inpatient Medications    Scheduled Meds: . loratadine  10 mg Oral Daily  . metoprolol succinate  25 mg Oral Daily    Vital Signs    Vitals:   06/02/20 2057 06/02/20 2130 06/03/20 0112 06/03/20 0858  BP: 101/64 107/74 (!) 92/57 103/68  Pulse:  77 72 75  Resp: 18 18 20    Temp:  97.9 F (36.6 C) 98.1 F (36.7 C)   TempSrc: Oral Oral Oral   SpO2: 100% 97% 96%   Weight:      Height:       No intake or output data in the 24 hours ending 06/03/20 1059 Filed Weights   06/01/20 2339  Weight: 81.6 kg    Telemetry    Sinus rhythm with occasional atrial ectopy.  Personally reviewed.  ECG    An ECG dated 06/01/2020 was personally reviewed today and demonstrated:  SVT with right bundle branch block.  Physical Exam   GEN: No acute distress.   Neck: No JVD. Cardiac: RRR, soft systolic murmur, no gallop.  Respiratory: Nonlabored. Clear to auscultation bilaterally. GI: Soft, nontender, bowel sounds present. MS: No edema; No deformity.  Labs    Chemistry Recent Labs  Lab 06/02/20 0150 06/03/20 1010  NA 139 141  K 4.2 3.9  CL 109 109  CO2 24 24  GLUCOSE 142* 100*  BUN 25* 23*  CREATININE 0.93 0.74  CALCIUM 8.3* 8.9  PROT 6.9 6.2*  ALBUMIN 3.7 3.6  AST 144* 37  ALT 108* 71*  ALKPHOS 79 57  BILITOT 0.3 0.5  GFRNONAA >60 >60  ANIONGAP 6 8     Hematology Recent Labs  Lab 06/02/20 0150 06/03/20 1010  WBC 8.8 5.8  RBC 4.39 4.35  HGB 12.4 12.2  HCT 38.9 38.5  MCV 88.6 88.5  MCH 28.2 28.0  MCHC 31.9 31.7  RDW 13.7 13.9  PLT 266 223    Radiology    DG Chest Port 1 View  Result Date: 06/02/2020 CLINICAL DATA:  Palpitations, diaphoresis and dizziness EXAM: PORTABLE CHEST 1 VIEW COMPARISON:  Radiograph 06/23/2013 FINDINGS: Cardiomegaly is likely similar to  prior counting for differences in technique with a pacer pack overlying the left chest wall leads in stable position at the cardiac apex and right atrium. External support devices, pacer pads and telemetry leads overlie the chest. Low volumes with streaky opacities favoring atelectasis with some mild vascular congestion without features of frank edema. No focal consolidation. No pneumothorax or visible effusion. No acute osseous or soft tissue abnormality. IMPRESSION: 1. Low volumes with streaky opacities favoring atelectasis. 2. Cardiomegaly with mild vascular congestion but no features of frank edema. Electronically Signed   By: 13/08/2012 M.D.   On: 06/02/2020 00:32   ECHOCARDIOGRAM COMPLETE  Result Date: 06/02/2020    ECHOCARDIOGRAM REPORT   Patient Name:   Tonya Gardner Date of Exam: 06/02/2020 Medical Rec #:  08/02/2020      Height:       62.0 in Accession #:    [id number]     Weight:       180.0 lb Date of Birth:  June 23, 1971      BSA:          1.828 m Patient Age:    49 years       BP:  106/74 mmHg Patient Gender: F              HR:           79 bpm. [facility]am [facility] Procedure: 2D Echo Indications:    Palpitations 785.1 / R00.2  History:        Patient has no prior history of Echocardiogram examinations.                 CHF, Pacemaker, Arrythmias:Atrial Fibrillation; Risk                 Factors:Former Smoker and Hypertension. SVT, Sinus Bradycardia,                 Tetralogy of Fallot repair in 1975.  Sonographer:    Jeryl Columbia RDCS (AE) Referring Phys: [id number] OLADAPO ADEFESO IMPRESSIONS  1. Poor acoustic windows limiit.  2. Left ventricular ejection fraction, by estimation, is 35 to 40%. The left ventricle has moderately decreased function. The left ventricle demonstrates global hypokinesis. There is mild left ventricular hypertrophy. Left ventricular diastolic parameters are consistent with Grade III diastolic dysfunction (restrictive).  3. Right ventricular systolic  function moderate to severely reduced. The right ventricular size is moderately enlarged.  4. Left atrial size was moderately dilated.  5. Right atrial size was moderately dilated.  6. The mitral valve is normal in structure. Moderate mitral valve regurgitation.  7. The aortic valve is normal in structure. Aortic valve regurgitation is not visualized.  8. The inferior vena cava is normal in size with <50% respiratory variability, suggesting right atrial pressure of 8 mmHg. FINDINGS  Left Ventricle: Left ventricular ejection fraction, by estimation, is 35 to 40%. The left ventricle has moderately decreased function. The left ventricle demonstrates global hypokinesis. The left ventricular internal cavity size was normal in size. There is mild left ventricular hypertrophy. Left ventricular diastolic parameters are consistent with Grade III diastolic dysfunction (restrictive). Right Ventricle: The right ventricular size is moderately enlarged. Right vetricular wall thickness was not assessed. Right ventricular systolic function moderate to severely reduced. Left Atrium: Left atrial size was moderately dilated. Right Atrium: Right atrial size was moderately dilated. Pericardium: There is no evidence of pericardial effusion. Mitral Valve: The mitral valve is normal in structure. Moderate mitral valve regurgitation. Tricuspid Valve: The tricuspid valve is normal in structure. Tricuspid valve regurgitation is mild. Aortic Valve: The aortic valve is normal in structure. Aortic valve regurgitation is not visualized. Pulmonic Valve: The pulmonic valve was not well visualized. Pulmonic valve regurgitation is mild to moderate. Aorta: The aortic root is normal in size and structure. Venous: The inferior vena cava is normal in size with less than 50% respiratory variability, suggesting right atrial pressure of 8 mmHg. IAS/Shunts: The interatrial septum was not assessed. Additional Comments: A pacer wire is visualized.  LEFT  VENTRICLE PLAX 2D LVIDd:         4.23 cm  Diastology LVIDs:         3.49 cm  LV e' medial:    5.98 cm/s LV PW:         1.51 cm  LV E/e' medial:  13.9 LV IVS:        0.91 cm  LV e' lateral:   5.77 cm/s LVOT diam:     1.90 cm  LV E/e' lateral: 14.4 LVOT Area:     2.84 cm  RIGHT VENTRICLE RV S prime:     6.74 cm/s TAPSE (M-mode): 1.4 cm LEFT ATRIUM  Index       RIGHT ATRIUM           Index LA diam:      4.50 cm 2.46 cm/m  RA Area:     18.30 cm LA Vol (A2C): 50.9 ml 27.85 ml/m RA Volume:   50.40 ml  27.57 ml/m LA Vol (A4C): 85.8 ml 46.94 ml/m   AORTA Ao Root diam: 3.00 cm MITRAL VALVE               TRICUSPID VALVE MV Area (PHT): 5.79 cm    TR Peak grad:   27.2 mmHg MV Decel Time: 131 msec    TR Vmax:        261.00 cm/s MV E velocity: 83.10 cm/s MV A velocity: 26.60 cm/s  SHUNTS MV E/A ratio:  3.12        Systemic Diam: 1.90 cm Dietrich Pates MD Electronically signed by Dietrich Pates MD Signature Date/Time: 06/02/2020/3:53:50 PM    Final     Patient Profile     49 y.o. female with a history of tetralogy of Fallot status post repair in 1975, symptomatic bradycardia status post pacemaker in 2002, and reportedly eclampsia and congestive heart failure with prior pregnancy now presenting with prolonged episode of SVT.  Assessment & Plan    1.  Prolonged episode of SVT, heart rate greater than 200 bpm and persisting for approximately 2 to 3 hours.  Patient ultimately converted after initially slowing with adenosine.  I do not have strips for review but reportedly the patient had evidence of atrial flutter with 1:1 conduction rather than AVNRT.  Dr. Tenny Craw reviewed with Dr. Ladona Ridgel and patient started on Toprol-XL 25 mg daily, not anticoagulated with first event.  2.  Biventricular dysfunction by echocardiogram performed yesterday.  LVEF graded at 35 to 40% range with associated moderate to severe RV dysfunction.  Duration uncertain with question of stunning due to recent tachycardia.  Patient has been active  up and to this point not reporting any limiting shortness of breath with activity, no exertional chest pain.  No orthopnea or PND.  3.  Tetralogy of Fallot status post repair 1975.  4.  History of symptomatic bradycardia in 2002 (setting of eclampsia).  Has pacemaker in place however battery was not replaced in 2012 with no recurring episodes of bradycardia.  She was seen in the past by Dr. Ladona Ridgel.  I reviewed the hospital course, consultation note by Dr. Tenny Craw and her recommendations.  Patient is clinically stable today.  We will go ahead with the limited echocardiogram as recommended by Dr. Tenny Craw. Even if she still has evidence of LV dysfunction, likely stable for discharge and further outpatient evaluation.  Would not expect LV dysfunction in relation to tetralogy of Fallot, she does not report any recent exertional symptoms to suspect worsening LVEF at baseline.  Also not RV pacing at this point.  Continue Toprol-XL 25 mg daily.  We will establish follow-up with Dr. Tenny Craw in the [place] office (she lives in [place]).  Question remains as to whether she will have any recurring episodes of atrial flutter that might require EP evaluation for ablation versus antiarrhythmics and anticoagulation.  Signed, Nona Dell, MD  06/03/2020, 10:59 AM

## 2020-06-03 NOTE — Progress Notes (Signed)
   Progress Note  Patient Name: Tonya Gardner Date of Encounter: 06/03/2020  Primary Cardiologist: Dietrich Pates, MD  Follow-up limited echocardiogram with Definity contrast today shows similar LV and RV dysfunction compared to yesterday's study, no LV mural thrombus.  Still could have been tachycardia-mediated with recent rapid atrial flutter and has not had time to recover.  With clinical stability and no recurrent arrhythmia on Toprol-XL, anticipate discharge home today as originally discussed.  We will arrange outpatient follow-up with Dr. Tenny Craw for ongoing management.  She may need further work-up for cardiomyopathy if this does not improve, and also consideration for EP consultation regarding potential for antiarrhythmic therapy or ablation.  Signed, Nona Dell, MD  06/03/2020, 3:42 PM

## 2020-06-03 NOTE — Discharge Summary (Signed)
Physician Discharge Summary  Tonya Gardner [id number]:[id number] DOB: 30-Dec-1970 DOA: 06/01/2020  PCP: Patient, No Pcp Per  Admit date: 06/01/2020  Discharge date: 06/03/2020  Admitted From:Home  Disposition:  Home  Recommendations for Outpatient Follow-up:  1. Follow up with PCP in 1-2 weeks 2. Follow-up with Dr. Dietrich Pates with cardiology as scheduled on 06/23/2020 at 10:40 AM 3. 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 This self resolved within 15 to 20 minutes. Patient went to relax in bed and shortly after this, there was a recurrence of same sensation and she decided to go to the ED for further evaluation and management.  10/11: Patient has been admitted with supraventricular tachycardia/paroxysmal atrial fibrillation which has now resolved.  She appears to be in stable condition with mild chest discomfort, but no palpitations or dyspnea or other issues noted.  She has been started on Eliquis and seen by cardiology with full recommendations still pending.  She will need telemetry monitoring overnight.  Anticipate discharge by 10/12 if she remains in stable condition.  2D echocardiogram ordered and pending.  Husband at bedside notified.  10/12: 2D echocardiogram on 10/11 demonstrates biventricular dysfunction with LVEF 35-40% with moderate to severe RV  dysfunction.  Repeat 2D echocardiogram was performed on 10/12 confirming similar findings.  Cardiology feels that she can follow-up in the outpatient setting as noted above and she is stable for discharge on Toprol-XL 25 mg daily with no need for anticoagulation.  Patient has remained without any further symptoms while inpatient and has been stable on telemetry monitoring while on Toprol XL as prescribed.  She is overall stable for discharge, but may require EP evaluation for ablation versus antiarrhythmics and anticoagulation in the near future.  Discharge Diagnoses:  Principal Problem:   Paroxysmal A-fib (HCC) Active Problems:   Supraventricular tachycardia (HCC)   History of CHF (congestive heart failure)   Hyperglycemia   Obesity (BMI 30-39.9)   History of tetralogy of Fallot  Principal discharge diagnosis: Prolonged episode of SVT in setting of biventricular dysfunction.  Discharge Instructions  Discharge Instructions    Amb referral to [facility]   Complete by: As directed    Diet - low sodium heart healthy   Complete by: As directed    Increase activity slowly   Complete by: As directed      Allergies as of 06/03/2020      Reactions   Sulfonamide Derivatives Hives   Latex Rash      Medication List    TAKE these medications   cetirizine 10 MG tablet Commonly known as: ZYRTEC Take 10 mg by mouth daily as needed. For allergies   ibuprofen 600 MG tablet Commonly known as: ADVIL Take 1 tablet (600 mg total) by mouth every 8 (eight) hours as needed for mild pain or moderate pain.   metoprolol succinate 25 MG 24 hr tablet Commonly known as: TOPROL-XL Take 1 tablet (25 mg total) by mouth daily. Start taking on: June 04, 2020   ondansetron 8 MG disintegrating tablet Commonly known as: Zofran ODT Take 1 tablet (8 mg total) by mouth every 8 (eight) hours as needed for nausea or vomiting.       Follow-up Information    Pricilla Riffle, MD Follow up on 06/23/2020.    Specialty: Cardiology Why: [facility] Follow-up on 06/23/2020 at 10:40 AM.  Contact information: [address] [place] [address] [phone]              Allergies  Allergen Reactions  . Sulfonamide Derivatives Hives  . Latex Rash    Consultations:  Cardiology   Procedures/Studies: DG Chest Port 1 View  Result Date: 06/02/2020 CLINICAL DATA:  Palpitations, diaphoresis and dizziness EXAM: PORTABLE CHEST 1 VIEW COMPARISON:  Radiograph 06/23/2013 FINDINGS: Cardiomegaly is likely similar to prior counting for differences in technique with a pacer pack overlying the left chest wall leads in stable position at the cardiac apex and right atrium. External support devices, pacer pads and telemetry leads overlie the chest. Low volumes with streaky opacities favoring atelectasis with some mild vascular congestion without features of frank edema. No focal consolidation. No pneumothorax or visible effusion. No acute osseous or soft tissue abnormality. IMPRESSION: 1. Low volumes with streaky opacities favoring atelectasis. 2. Cardiomegaly with mild vascular congestion but no features of frank edema. Electronically Signed   By: Kreg Shropshire M.D.   On: 06/02/2020 00:32   ECHOCARDIOGRAM COMPLETE  Result Date: 06/02/2020    ECHOCARDIOGRAM REPORT   Patient Name:   Tonya Gardner Date of Exam: 06/02/2020 Medical Rec #:  [id number]      Height:       62.0 in Accession #:    [id number]     Weight:       180.0 lb Date of Birth:  July 17, 1971      BSA:          1.828 m Patient Age:    49 years       BP:           106/74 mmHg Patient Gender: F              HR:           79 bpm. [facility] Procedure: 2D Echo Indications:    Palpitations 785.1 / R00.2  History:        Patient has no prior history of Echocardiogram examinations.                 CHF, Pacemaker, Arrythmias:Atrial Fibrillation; Risk                 Factors:Former Smoker and Hypertension. SVT, Sinus  Bradycardia,                 Tetralogy of Fallot repair in 1975.  Sonographer:    Jeryl Columbia RDCS (AE) Referring Phys: [id number] OLADAPO ADEFESO IMPRESSIONS  1. Poor acoustic windows limiit.  2. Left ventricular ejection fraction, by estimation, is 35 to 40%. The left ventricle has moderately decreased function. The left ventricle demonstrates global hypokinesis. There is mild left ventricular hypertrophy. Left ventricular diastolic parameters are consistent with Grade III diastolic dysfunction (restrictive).  3. Right ventricular systolic function moderate to severely reduced. The right ventricular size is moderately enlarged.  4. Left atrial size was moderately dilated.  5. Right atrial size was moderately dilated.  6. The mitral valve is normal in structure. Moderate mitral valve regurgitation.  7. The aortic valve is normal in structure. Aortic valve regurgitation is not visualized.  8. The inferior vena cava is normal in size with <50% respiratory variability, suggesting right atrial pressure of 8 mmHg. FINDINGS  Left Ventricle: Left ventricular ejection fraction, by estimation, is 35 to 40%. The left ventricle has moderately decreased function. The left ventricle demonstrates global hypokinesis. The left ventricular internal cavity size was normal in size. There is mild left ventricular hypertrophy. Left ventricular diastolic parameters are consistent with Grade III diastolic dysfunction (restrictive). Right Ventricle: The right ventricular size is moderately enlarged. Right vetricular wall thickness was not assessed. Right ventricular systolic function moderate to severely reduced. Left Atrium: Left atrial size was moderately dilated. Right Atrium: Right atrial size was moderately dilated. Pericardium: There is no evidence of pericardial effusion. Mitral Valve: The mitral valve is normal in structure. Moderate mitral valve regurgitation. Tricuspid Valve: The tricuspid valve is normal in structure.  Tricuspid valve regurgitation is mild. Aortic Valve: The aortic valve is normal in structure. Aortic valve regurgitation is not visualized. Pulmonic Valve: The pulmonic valve was not well visualized. Pulmonic valve regurgitation is mild to moderate. Aorta: The aortic root is normal in size and structure. Venous: The inferior vena cava is normal in size with less than 50% respiratory variability, suggesting right atrial pressure of 8 mmHg. IAS/Shunts: The interatrial septum was not assessed. Additional Comments: A pacer wire is visualized.  LEFT VENTRICLE PLAX 2D LVIDd:         4.23 cm  Diastology LVIDs:         3.49 cm  LV e' medial:    5.98 cm/s LV PW:         1.51 cm  LV E/e' medial:  13.9 LV IVS:        0.91 cm  LV e' lateral:   5.77 cm/s LVOT diam:     1.90 cm  LV E/e' lateral: 14.4 LVOT Area:     2.84 cm  RIGHT VENTRICLE RV S prime:     6.74 cm/s TAPSE (M-mode): 1.4 cm LEFT ATRIUM           Index       RIGHT ATRIUM           Index LA diam:      4.50 cm 2.46 cm/m  RA Area:     18.30 cm LA Vol (A2C): 50.9 ml 27.85 ml/m RA Volume:   50.40 ml  27.57 ml/m LA Vol (A4C): 85.8 ml 46.94 ml/m   AORTA Ao Root diam: 3.00 cm MITRAL VALVE               TRICUSPID VALVE MV Area (PHT): 5.79 cm    TR Peak grad:   27.2 mmHg MV Decel Time: 131 msec    TR Vmax:        261.00 cm/s MV E velocity: 83.10 cm/s MV A velocity: 26.60 cm/s  SHUNTS MV E/A ratio:  3.12        Systemic Diam: 1.90 cm Dietrich Pates MD Electronically signed by Dietrich Pates MD Signature Date/Time: 06/02/2020/3:53:50 PM    Final    ECHOCARDIOGRAM LIMITED  Result Date: 06/03/2020    ECHOCARDIOGRAM LIMITED REPORT   Patient Name:   Tonya Gardner Date of Exam: 06/03/2020 Medical Rec #:  [id number]      Height:       62.0 in Accession #:    [id number]     Weight:       180.0  lb Date of Birth:  09/22/1970      BSA:          1.828 m Patient Age:    49 years       BP:           103/68 mmHg Patient Gender: F              HR:           75 bpm. [facility] Procedure: 2D Echo and Intracardiac Opacification Agent Indications:    Cardiomyopathy-Unspecified 425.9 / I42.9  History:        Patient has prior history of Echocardiogram examinations, most                 recent 06/02/2020. CHF, Pacemaker, Arrythmias:Atrial                 Fibrillation; Risk Factors:Hypertension. SVT, History of                 tetralogy of Fallot, Sinus Bradycardia.  Sonographer:    Jeryl Columbia RDCS (AE) Referring Phys: [id number] SAMUEL G MCDOWELL IMPRESSIONS  1. Limited study.  2. Left ventricular ejection fraction, by estimation, is 35 to 40%. The left ventricle has moderately decreased function. The left ventricle demonstrates global hypokinesis. No obvious LV mural thrombus.  3. Right ventricular systolic function is moderately reduced. The right ventricular size is moderately enlarged. Mildly increased right ventricular wall thickness.  4. The inferior vena cava is normal in size with greater than 50% respiratory variability, suggesting right atrial pressure of 3 mmHg. FINDINGS  Left Ventricle: Left ventricular ejection fraction, by estimation, is 35 to 40%. The left ventricle has moderately decreased function. The left ventricle demonstrates global hypokinesis. Definity contrast agent was given IV to delineate the left ventricular endocardial borders. Right Ventricle: The right ventricular size is moderately enlarged. Mildly increased right ventricular wall thickness. Right ventricular systolic function is moderately reduced. Venous: The inferior vena cava is normal in size with greater than 50% respiratory variability, suggesting right atrial pressure of 3 mmHg. Nona Dell MD Electronically signed by Nona Dell MD Signature Date/Time: 06/03/2020/3:17:43 PM    Final      Discharge Exam: Vitals:   06/03/20 0858 06/03/20 1339  BP: 103/68 (!) 87/68  Pulse: 75 77  Resp:  20  Temp:  98.4 F (36.9 C)  SpO2:  97%   Vitals:   06/02/20 2130 06/03/20 0112 06/03/20 0858  06/03/20 1339  BP: 107/74 (!) 92/57 103/68 (!) 87/68  Pulse: 77 72 75 77  Resp: 18 20  20   Temp: 97.9 F (36.6 C) 98.1 F (36.7 C)  98.4 F (36.9 C)  TempSrc: Oral Oral  Oral  SpO2: 97% 96%  97%  Weight:      Height:        General: Pt is alert, awake, not in acute distress Cardiovascular: RRR, S1/S2 +, no rubs, no gallops Respiratory: CTA bilaterally, no wheezing, no rhonchi Abdominal: Soft, NT, ND, bowel sounds + Extremities: no edema, no cyanosis    The results of significant diagnostics from this hospitalization (including imaging, microbiology, ancillary and laboratory) are listed below for reference.     Microbiology: Recent Results (from the past 240 hour(s))  Respiratory Panel by RT PCR (Flu A&B, Covid) - Nasopharyngeal Swab     Status: None   Collection Time: 06/02/20 12:27 AM   Specimen: Nasopharyngeal Swab  Result Value Ref Range Status   SARS Coronavirus  2 by RT PCR NEGATIVE NEGATIVE Final    Comment: (NOTE) SARS-CoV-2 target nucleic acids are NOT DETECTED.  The SARS-CoV-2 RNA is generally detectable in upper respiratoy specimens during the acute phase of infection. The lowest concentration of SARS-CoV-2 viral copies this assay can detect is 131 copies/mL. A negative result does not preclude SARS-Cov-2 infection and should not be used as the sole basis for treatment or other patient management decisions. A negative result may occur with  improper specimen collection/handling, submission of specimen other than nasopharyngeal swab, presence of viral mutation(s) within the areas targeted by this assay, and inadequate number of viral copies (<131 copies/mL). A negative result must be combined with clinical observations, patient history, and epidemiological information. The expected result is Negative.  Fact Sheet for Patients:  https://www.[employer].com/  Fact Sheet for Healthcare Providers:  https://www.young.biz/  This  test is no t yet approved or cleared by the [place] FDA and  has been authorized for detection and/or diagnosis of SARS-CoV-2 by FDA under an Emergency Use Authorization (EUA). This EUA will remain  in effect (meaning this test can be used) for the duration of the COVID-19 declaration under Section 564(b)(1) of the Act, 21 U.S.C. section 360bbb-3(b)(1), unless the authorization is terminated or revoked sooner.     Influenza A by PCR NEGATIVE NEGATIVE Final   Influenza B by PCR NEGATIVE NEGATIVE Final    Comment: (NOTE) The Xpert Xpress SARS-CoV-2/FLU/RSV assay is intended as an aid in  the diagnosis of influenza from Nasopharyngeal swab specimens and  should not be used as a sole basis for treatment. Nasal washings and  aspirates are unacceptable for Xpert Xpress SARS-CoV-2/FLU/RSV  testing.  Fact Sheet for Patients: https://www.[place]ell.com/  Fact Sheet for Healthcare Providers: https://www.young.biz/  This test is not yet approved or cleared by the [place] FDA and  has been authorized for detection and/or diagnosis of SARS-CoV-2 by  FDA under an Emergency Use Authorization (EUA). This EUA will remain  in effect (meaning this test can be used) for the duration of the  Covid-19 declaration under Section 564(b)(1) of the Act, 21  U.S.C. section 360bbb-3(b)(1), unless the authorization is  terminated or revoked. Performed at [facility], [address], [place], [place] [address]      Labs: BNP (last 3 results) No results for input(s): BNP in the last 8760 hours. Basic Metabolic Panel: Recent Labs  Lab 06/02/20 0150 06/03/20 1010  NA 139 141  K 4.2 3.9  CL 109 109  CO2 24 24  GLUCOSE 142* 100*  BUN 25* 23*  CREATININE 0.93 0.74  CALCIUM 8.3* 8.9  MG 2.2 2.1  PHOS  --  3.6   Liver Function Tests: Recent Labs  Lab 06/02/20 0150 06/03/20 1010  AST 144* 37  ALT 108* 71*  ALKPHOS 79 57  BILITOT 0.3 0.5  PROT 6.9  6.2*  ALBUMIN 3.7 3.6   No results for input(s): LIPASE, AMYLASE in the last 168 hours. No results for input(s): AMMONIA in the last 168 hours. CBC: Recent Labs  Lab 06/02/20 0150 06/03/20 1010  WBC 8.8 5.8  NEUTROABS 7.0  --   HGB 12.4 12.2  HCT 38.9 38.5  MCV 88.6 88.5  PLT 266 223   Cardiac Enzymes: No results for input(s): CKTOTAL, CKMB, CKMBINDEX, TROPONINI in the last 168 hours. BNP: Invalid input(s): POCBNP CBG: No results for input(s): GLUCAP in the last 168 hours. D-Dimer No results for input(s): DDIMER in the last 72 hours. Hgb A1c No  results for input(s): HGBA1C in the last 72 hours. Lipid Profile No results for input(s): CHOL, HDL, LDLCALC, TRIG, CHOLHDL, LDLDIRECT in the last 72 hours. Thyroid function studies No results for input(s): TSH, T4TOTAL, T3FREE, THYROIDAB in the last 72 hours.  Invalid input(s): FREET3 Anemia work up No results for input(s): VITAMINB12, FOLATE, FERRITIN, TIBC, IRON, RETICCTPCT in the last 72 hours. Urinalysis    Component Value Date/Time   COLORURINE YELLOW 06/28/2018 1024   APPEARANCEUR CLEAR 06/28/2018 1024   LABSPEC 1.020 06/28/2018 1024   PHURINE 6.5 06/28/2018 1024   GLUCOSEU NEGATIVE 06/28/2018 1024   HGBUR NEGATIVE 06/28/2018 1024   BILIRUBINUR NEGATIVE 06/28/2018 1024   KETONESUR NEGATIVE 06/28/2018 1024   PROTEINUR NEGATIVE 06/28/2018 1024   NITRITE NEGATIVE 06/28/2018 1024   LEUKOCYTESUR MODERATE (A) 06/28/2018 1024   Sepsis Labs Invalid input(s): PROCALCITONIN,  WBC,  LACTICIDVEN Microbiology Recent Results (from the past 240 hour(s))  Respiratory Panel by RT PCR (Flu A&B, Covid) - Nasopharyngeal Swab     Status: None   Collection Time: 06/02/20 12:27 AM   Specimen: Nasopharyngeal Swab  Result Value Ref Range Status   SARS Coronavirus 2 by RT PCR NEGATIVE NEGATIVE Final    Comment: (NOTE) SARS-CoV-2 target nucleic acids are NOT DETECTED.  The SARS-CoV-2 RNA is generally detectable in upper  respiratoy specimens during the acute phase of infection. The lowest concentration of SARS-CoV-2 viral copies this assay can detect is 131 copies/mL. A negative result does not preclude SARS-Cov-2 infection and should not be used as the sole basis for treatment or other patient management decisions. A negative result may occur with  improper specimen collection/handling, submission of specimen other than nasopharyngeal swab, presence of viral mutation(s) within the areas targeted by this assay, and inadequate number of viral copies (<131 copies/mL). A negative result must be combined with clinical observations, patient history, and epidemiological information. The expected result is Negative.  Fact Sheet for Patients:  https://www.[place]ier.com/  Fact Sheet for Healthcare Providers:  https://www.young.biz/  This test is no t yet approved or cleared by the [place] FDA and  has been authorized for detection and/or diagnosis of SARS-CoV-2 by FDA under an Emergency Use Authorization (EUA). This EUA will remain  in effect (meaning this test can be used) for the duration of the COVID-19 declaration under Section 564(b)(1) of the Act, 21 U.S.C. section 360bbb-3(b)(1), unless the authorization is terminated or revoked sooner.     Influenza A by PCR NEGATIVE NEGATIVE Final   Influenza B by PCR NEGATIVE NEGATIVE Final    Comment: (NOTE) The Xpert Xpress SARS-CoV-2/FLU/RSV assay is intended as an aid in  the diagnosis of influenza from Nasopharyngeal swab specimens and  should not be used as a sole basis for treatment. Nasal washings and  aspirates are unacceptable for Xpert Xpress SARS-CoV-2/FLU/RSV  testing.  Fact Sheet for Patients: https://www.[place]elson.com/  Fact Sheet for Healthcare Providers: https://www.young.biz/  This test is not yet approved or cleared by the [place] FDA and  has been  authorized for detection and/or diagnosis of SARS-CoV-2 by  FDA under an Emergency Use Authorization (EUA). This EUA will remain  in effect (meaning this test can be used) for the duration of the  Covid-19 declaration under Section 564(b)(1) of the Act, 21  U.S.C. section 360bbb-3(b)(1), unless the authorization is  terminated or revoked. Performed at [facility], [address] [place], [place] [address]      Time coordinating discharge: 35 minutes  SIGNED:   Erick Blinks,  DO [facility] 06/03/2020, 3:23 PM  If 7PM-7AM, please contact night-coverage [email or web address]

## 2020-06-03 NOTE — Plan of Care (Signed)

## 2020-06-09 ENCOUNTER — Telehealth: Payer: Self-pay | Admitting: Internal Medicine

## 2020-06-09 NOTE — Telephone Encounter (Signed)
Metoprolol was started at hospitalization on 06/02/20.  Will route to Dr. Tenny Craw.

## 2020-06-09 NOTE — Telephone Encounter (Signed)
Pt wrote in that metoprolol leading to N/ I am not convinced  She needs for HR control   DIltiazem is not a good option   Needs to be seen in f/u in clinic   Is there a way to get her seen soon?

## 2020-06-11 NOTE — Telephone Encounter (Signed)
Called patient and adv of recommendations. She is going to try to move the timing of the dose to bedtime, backing it up a few hours each day, to see if this helps nausea.  Her hospital follow up has been moved to 06/16/20.  She is pleased to move up appointment with Dr. Tenny Craw

## 2020-06-12 ENCOUNTER — Telehealth: Payer: Self-pay | Admitting: Internal Medicine

## 2020-06-12 DIAGNOSIS — R110 Nausea: Secondary | ICD-10-CM

## 2020-06-12 NOTE — Telephone Encounter (Signed)
Pt wrote in about getting Rx for nausea  She has an appt on Mon 10/25 Zofran 4 mg could be used a couple times per day BUT real reason is why is she nauseated Give limited Rx (20 tabs of Zofran) to take as needed 2x per day Would get labs prior to appt on Monday CBC, CMET, BNP

## 2020-06-13 ENCOUNTER — Other Ambulatory Visit: Payer: Self-pay

## 2020-06-13 ENCOUNTER — Other Ambulatory Visit (HOSPITAL_COMMUNITY)
Admission: RE | Admit: 2020-06-13 | Discharge: 2020-06-13 | Disposition: A | Discharge status: Home / Self Care | Payer: BC Managed Care – PPO | Source: Ambulatory Visit | Attending: Internal Medicine | Admitting: Internal Medicine

## 2020-06-13 DIAGNOSIS — R110 Nausea: Secondary | ICD-10-CM | POA: Insufficient documentation

## 2020-06-13 LAB — BRAIN NATRIURETIC PEPTIDE: B Natriuretic Peptide: 219 pg/mL — ABNORMAL HIGH (ref 0.0–100.0)

## 2020-06-13 LAB — CBC
HCT: 42.3 % (ref 36.0–46.0)
Hemoglobin: 13.5 g/dL (ref 12.0–15.0)
MCH: 28.2 pg (ref 26.0–34.0)
MCHC: 31.9 g/dL (ref 30.0–36.0)
MCV: 88.5 fL (ref 80.0–100.0)
Platelets: 244 10*3/uL (ref 150–400)
RBC: 4.78 MIL/uL (ref 3.87–5.11)
RDW: 13.4 % (ref 11.5–15.5)
WBC: 5.6 10*3/uL (ref 4.0–10.5)
nRBC: 0 % (ref 0.0–0.2)

## 2020-06-13 LAB — BASIC METABOLIC PANEL
Anion gap: 9 (ref 5–15)
BUN: 16 mg/dL (ref 6–20)
CO2: 26 mmol/L (ref 22–32)
Calcium: 9.2 mg/dL (ref 8.9–10.3)
Chloride: 105 mmol/L (ref 98–111)
Creatinine, Ser: 0.7 mg/dL (ref 0.44–1.00)
GFR, Estimated: >60 mL/min (ref >60)
Glucose, Bld: 82 mg/dL (ref 70–99)
Potassium: 4.6 mmol/L (ref 3.5–5.1)
Sodium: 140 mmol/L (ref 135–145)

## 2020-06-13 MED ORDER — ONDANSETRON 8 MG PO TBDP
8.0000 mg | ORAL_TABLET | Freq: Two times a day (BID) | ORAL | 0 refills | Status: DC
Start: 2020-06-13 — End: 2020-07-03

## 2020-06-13 NOTE — Addendum Note (Signed)
Addended by: Marlyn Corporal A on: 06/13/2020 07:50 AM   Modules accepted: Orders

## 2020-06-13 NOTE — Telephone Encounter (Signed)
I spoke with patient.She will have labs done today at [facility]

## 2020-06-15 NOTE — H&P (View-Only) (Signed)
Cardiology Office Note   Date:  06/17/2020   ID:  Tonya Gardner, DOB 09-22-1970, MRN [id number]  PCP:  Patient, No Pcp Per  Cardiologist:   Dietrich Pates, MD   Pt presents for f/u of fatigue, SOB   History of Present Illness: Tonya Gardner is a 49 y.o. female with a history of Tetralogy f Fallot (s/p repair in 1975), bradycardia (symptomatic in 2002; endo of life 2012).   She was recently admitted to [facility] for dizzyess, SOB, diaphoresis.   EKG showed ST   Slowed with adenosine then converted to SR She was seen in consult by cardiology   She said prior to the admission she had been doing OK   Had just gotten back from [place]  Did fine with walking   No SOB   She denied palpitatoins prior   Echo was done on 10/11 and 10/12  SHowed moderate LV and RV dysfunction.   No echo prior     The pt was sent home with plan for f/u in clinic and  echo in a few months   She was sent home on Toprol  Since discharge she has not felt well   Presents today for follow up  Complains of nausea   Complains of fatigue, dyspnea with exertion   No CP   Has not felt herself since d/c     Current Meds  Medication Sig  . cetirizine (ZYRTEC) 10 MG tablet Take 10 mg by mouth daily as needed. For allergies  . ibuprofen (ADVIL,MOTRIN) 600 MG tablet Take 1 tablet (600 mg total) by mouth every 8 (eight) hours as needed for mild pain or moderate pain.  . metoprolol succinate (TOPROL-XL) 25 MG 24 hr tablet Take 1 tablet (25 mg total) by mouth daily.  . ondansetron (ZOFRAN ODT) 8 MG disintegrating tablet Take 1 tablet (8 mg total) by mouth 2 (two) times daily. (Patient taking differently: Take 8 mg by mouth as needed. )     Allergies:   Sulfonamide derivatives and Latex   Past Medical History:  Diagnosis Date  . CHF (congestive heart failure) (HCC) 2002   with pregnancy  . Headache   . Presence of permanent cardiac pacemaker    off for 2 years  . Seizures (HCC) 2002   due to eclampsia  . Sinus bradycardia     . Tetralogy of Fallot s/p repair 1975    Past Surgical History:  Procedure Laterality Date  . ABDOMINAL HYSTERECTOMY N/A 08/12/2014   Procedure: HYSTERECTOMY ABDOMINAL;  Surgeon: Jeani Hawking, MD;  Location: [facility];  Service: Gynecology;  Laterality: N/A;  . CESAREAN SECTION    . LAPAROSCOPIC ASSISTED VAGINAL HYSTERECTOMY N/A 08/12/2014   Procedure: ATTEMPTED LAPAROSCOPIC ASSISTED VAGINAL HYSTERECTOMY;  Surgeon: Jeani Hawking, MD;  Location: [facility];  Service: Gynecology;  Laterality: N/A;  . PACEMAKER INSERTION     [employer]   . SALPINGOOPHORECTOMY Bilateral 08/12/2014   Procedure: SALPINGO OOPHORECTOMY;  Surgeon: Jeani Hawking, MD;  Location: [facility];  Service: Gynecology;  Laterality: Bilateral;  . TETRALOGY OF FALLOT REPAIR    . TONSILLECTOMY       Social History:  The patient  reports that she has quit smoking. She has never used smokeless tobacco. She reports current alcohol use. She reports that she does not use drugs.   Family History:  The patient's family history is not on file.    ROS:  Please see the history of present illness. All other  systems are reviewed and  Negative to the above problem except as noted.    PHYSICAL EXAM: VS:  BP 120/80   Pulse 70   Ht 5' 2" (1.575 m)   Wt 183 lb 9.6 oz (83.3 kg)   LMP 07/23/2014 (Approximate)   SpO2 98%   BMI 33.58 kg/m   GEN: Well nourished, well developed, in no acute distress  HEENT: normal  Neck: no JVD, carotid bruits Cardiac: RRR; no murmurs  No LE edema  Respiratory:  clear to auscultation bilaterally, GI: soft, nontender, nondistended, + BS  No hepatomegaly  MS: no deformity Moving all extremities   Skin: warm and dry, no rash Neuro:  Strength and sensation are intact Psych: euthymic mood, full affect   EKG:  EKG is not ordered today.  Echo  06/02/20  1. Poor acoustic windows limiit. 2. Left ventricular ejection fraction, by estimation, is 35 to 40%. The left ventricle has moderately  decreased function. The left ventricle demonstrates global hypokinesis. There is mild left ventricular hypertrophy. Left ventricular diastolic parameters are consistent with Grade III diastolic dysfunction (restrictive). 3. Right ventricular systolic function moderate to severely reduced. The right ventricular size is moderately enlarged. 4. Left atrial size was moderately dilated. 5. Right atrial size was moderately dilated. 6. The mitral valve is normal in structure. Moderate mitral valve regurgitation. 7. The aortic valve is normal in structure. Aortic valve regurgitation is not visualized. 8. The inferior vena cava is normal in size with <50% respiratory variability, suggesting right atrial pressure of 8 mmHg.  Limited echo 10.12.21  Limited study. 2. Left ventricular ejection fraction, by estimation, is 35 to 40%. The left ventricle has moderately decreased function. The left ventricle demonstrates global hypokinesis. No obvious LV mural thrombus. 3. Right ventricular systolic function is moderately reduced. The right ventricular size is moderately enlarged. Mildly increased right ventricular wall thickness. 4. The inferior vena cava is normal in size with greater than 50% respiratory variability, suggesting right atrial pressure of 3 mmHg.  Lipid Panel No results found for: CHOL, TRIG, HDL, CHOLHDL, VLDL, LDLCALC, LDLDIRECT    Wt Readings from Last 3 Encounters:  06/16/20 183 lb 9.6 oz (83.3 kg)  06/01/20 180 lb (81.6 kg)  06/28/18 200 lb (90.7 kg)      ASSESSMENT AND PLAN:  1  Chronic systolic CHF   Pt with recent echo showing LV and RV dysfunction   I do not see any prior   On exam, volume status is OK SHe had been doing OK prior to recent admit.   RV function is down as well   I would recomm CT (high res) of chest to r/o chornic PE   If this is neg would recomm R and L heart catheterization given above  2SVT   No recurrence      Current medicines are reviewed at  length with the patient today.  The patient does not have concerns regarding medicines.  Signed, Tejay Hubert, MD  06/17/2020 4:34 PM    [facility] [address], [place], [place]  [address] Phone: [phone]; Fax: [phone]    

## 2020-06-15 NOTE — Progress Notes (Signed)
Cardiology Office Note   Date:  06/17/2020   ID:  Tonya Gardner, DOB 09-22-1970, MRN [id number]  PCP:  Patient, No Pcp Per  Cardiologist:   Dietrich Pates, MD   Pt presents for f/u of fatigue, SOB   History of Present Illness: Tonya Gardner is a 49 y.o. female with a history of Tetralogy f Fallot (s/p repair in 1975), bradycardia (symptomatic in 2002; endo of life 2012).   She was recently admitted to [facility] for dizzyess, SOB, diaphoresis.   EKG showed ST   Slowed with adenosine then converted to SR She was seen in consult by cardiology   She said prior to the admission she had been doing OK   Had just gotten back from [place]  Did fine with walking   No SOB   She denied palpitatoins prior   Echo was done on 10/11 and 10/12  SHowed moderate LV and RV dysfunction.   No echo prior     The pt was sent home with plan for f/u in clinic and  echo in a few months   She was sent home on Toprol  Since discharge she has not felt well   Presents today for follow up  Complains of nausea   Complains of fatigue, dyspnea with exertion   No CP   Has not felt herself since d/c     Current Meds  Medication Sig  . cetirizine (ZYRTEC) 10 MG tablet Take 10 mg by mouth daily as needed. For allergies  . ibuprofen (ADVIL,MOTRIN) 600 MG tablet Take 1 tablet (600 mg total) by mouth every 8 (eight) hours as needed for mild pain or moderate pain.  . metoprolol succinate (TOPROL-XL) 25 MG 24 hr tablet Take 1 tablet (25 mg total) by mouth daily.  . ondansetron (ZOFRAN ODT) 8 MG disintegrating tablet Take 1 tablet (8 mg total) by mouth 2 (two) times daily. (Patient taking differently: Take 8 mg by mouth as needed. )     Allergies:   Sulfonamide derivatives and Latex   Past Medical History:  Diagnosis Date  . CHF (congestive heart failure) (HCC) 2002   with pregnancy  . Headache   . Presence of permanent cardiac pacemaker    off for 2 years  . Seizures (HCC) 2002   due to eclampsia  . Sinus bradycardia     . Tetralogy of Fallot s/p repair 1975    Past Surgical History:  Procedure Laterality Date  . ABDOMINAL HYSTERECTOMY N/A 08/12/2014   Procedure: HYSTERECTOMY ABDOMINAL;  Surgeon: Jeani Hawking, MD;  Location: [facility];  Service: Gynecology;  Laterality: N/A;  . CESAREAN SECTION    . LAPAROSCOPIC ASSISTED VAGINAL HYSTERECTOMY N/A 08/12/2014   Procedure: ATTEMPTED LAPAROSCOPIC ASSISTED VAGINAL HYSTERECTOMY;  Surgeon: Jeani Hawking, MD;  Location: [facility];  Service: Gynecology;  Laterality: N/A;  . PACEMAKER INSERTION     [employer]   . SALPINGOOPHORECTOMY Bilateral 08/12/2014   Procedure: SALPINGO OOPHORECTOMY;  Surgeon: Jeani Hawking, MD;  Location: [facility];  Service: Gynecology;  Laterality: Bilateral;  . TETRALOGY OF FALLOT REPAIR    . TONSILLECTOMY       Social History:  The patient  reports that she has quit smoking. She has never used smokeless tobacco. She reports current alcohol use. She reports that she does not use drugs.   Family History:  The patient's family history is not on file.    ROS:  Please see the history of present illness. All other  systems are reviewed and  Negative to the above problem except as noted.    PHYSICAL EXAM: VS:  BP 120/80   Pulse 70   Ht 5\' 2"  (1.575 m)   Wt 183 lb 9.6 oz (83.3 kg)   LMP 07/23/2014 (Approximate)   SpO2 98%   BMI 33.58 kg/m   GEN: Well nourished, well developed, in no acute distress  HEENT: normal  Neck: no JVD, carotid bruits Cardiac: RRR; no murmurs  No LE edema  Respiratory:  clear to auscultation bilaterally, GI: soft, nontender, nondistended, + BS  No hepatomegaly  MS: no deformity Moving all extremities   Skin: warm and dry, no rash Neuro:  Strength and sensation are intact Psych: euthymic mood, full affect   EKG:  EKG is not ordered today.  Echo  06/02/20  1. Poor acoustic windows limiit. 2. Left ventricular ejection fraction, by estimation, is 35 to 40%. The left ventricle has moderately  decreased function. The left ventricle demonstrates global hypokinesis. There is mild left ventricular hypertrophy. Left ventricular diastolic parameters are consistent with Grade III diastolic dysfunction (restrictive). 3. Right ventricular systolic function moderate to severely reduced. The right ventricular size is moderately enlarged. 4. Left atrial size was moderately dilated. 5. Right atrial size was moderately dilated. 6. The mitral valve is normal in structure. Moderate mitral valve regurgitation. 7. The aortic valve is normal in structure. Aortic valve regurgitation is not visualized. 8. The inferior vena cava is normal in size with <50% respiratory variability, suggesting right atrial pressure of 8 mmHg.  Limited echo 10.12.21  Limited study. 2. Left ventricular ejection fraction, by estimation, is 35 to 40%. The left ventricle has moderately decreased function. The left ventricle demonstrates global hypokinesis. No obvious LV mural thrombus. 3. Right ventricular systolic function is moderately reduced. The right ventricular size is moderately enlarged. Mildly increased right ventricular wall thickness. 4. The inferior vena cava is normal in size with greater than 50% respiratory variability, suggesting right atrial pressure of 3 mmHg.  Lipid Panel No results found for: CHOL, TRIG, HDL, CHOLHDL, VLDL, LDLCALC, LDLDIRECT    Wt Readings from Last 3 Encounters:  06/16/20 183 lb 9.6 oz (83.3 kg)  06/01/20 180 lb (81.6 kg)  06/28/18 200 lb (90.7 kg)      ASSESSMENT AND PLAN:  1  Chronic systolic CHF   Pt with recent echo showing LV and RV dysfunction   I do not see any prior   On exam, volume status is OK SHe had been doing OK prior to recent admit.   RV function is down as well   I would recomm CT (high res) of chest to r/o chornic PE   If this is neg would recomm R and L heart catheterization given above  2SVT   No recurrence      Current medicines are reviewed at  length with the patient today.  The patient does not have concerns regarding medicines.  Signed, 13/06/19, MD  06/17/2020 4:34 PM    [facility] [address] [place], [place], [place]  [place] Phone: [phone]; Fax: [phone]

## 2020-06-16 ENCOUNTER — Other Ambulatory Visit: Payer: Self-pay

## 2020-06-16 ENCOUNTER — Ambulatory Visit (INDEPENDENT_AMBULATORY_CARE_PROVIDER_SITE_OTHER): Payer: BC Managed Care – PPO | Admitting: Internal Medicine

## 2020-06-16 ENCOUNTER — Encounter: Payer: Self-pay | Admitting: Internal Medicine

## 2020-06-16 VITALS — BP 120/80 | HR 70 | Ht 62.0 in | Wt 183.6 lb

## 2020-06-16 DIAGNOSIS — R0602 Shortness of breath: Secondary | ICD-10-CM | POA: Diagnosis not present

## 2020-06-17 ENCOUNTER — Inpatient Hospital Stay: Admission: RE | Admit: 2020-06-17 | Payer: BC Managed Care – PPO | Source: Ambulatory Visit

## 2020-06-18 ENCOUNTER — Inpatient Hospital Stay: Admission: RE | Admit: 2020-06-18 | Payer: BC Managed Care – PPO | Source: Ambulatory Visit

## 2020-06-23 ENCOUNTER — Ambulatory Visit: Payer: BC Managed Care – PPO | Admitting: Internal Medicine

## 2020-06-24 ENCOUNTER — Other Ambulatory Visit: Payer: Self-pay

## 2020-06-24 ENCOUNTER — Ambulatory Visit (INDEPENDENT_AMBULATORY_CARE_PROVIDER_SITE_OTHER)
Admission: RE | Admit: 2020-06-24 | Discharge: 2020-06-24 | Disposition: A | Discharge status: Home / Self Care | Payer: BC Managed Care – PPO | Source: Ambulatory Visit | Attending: Internal Medicine | Admitting: Internal Medicine

## 2020-06-24 DIAGNOSIS — R0602 Shortness of breath: Secondary | ICD-10-CM

## 2020-06-24 DIAGNOSIS — I517 Cardiomegaly: Secondary | ICD-10-CM | POA: Diagnosis not present

## 2020-06-24 MED ORDER — METOPROLOL SUCCINATE ER 25 MG PO TB24
25.0000 mg | ORAL_TABLET | Freq: Every day | ORAL | 3 refills | Status: DC
Start: 2020-06-24 — End: 2020-07-30

## 2020-06-24 MED ORDER — IOHEXOL 350 MG/ML SOLN
80.0000 mL | Freq: Once | INTRAVENOUS | Status: AC | PRN
  Administered 2020-06-24: 80 mL via INTRAVENOUS

## 2020-06-26 ENCOUNTER — Telehealth: Payer: Self-pay | Admitting: Internal Medicine

## 2020-06-26 NOTE — Telephone Encounter (Signed)
    I went in pt's chart to see who called pt. This call was [facility] to [facility]

## 2020-06-26 NOTE — Telephone Encounter (Signed)
Error

## 2020-06-27 ENCOUNTER — Telehealth: Payer: Self-pay | Admitting: *Deleted

## 2020-06-27 ENCOUNTER — Encounter: Payer: Self-pay | Admitting: *Deleted

## 2020-06-27 NOTE — Telephone Encounter (Signed)
R/L heart cath arranged for 07/01/20, 9:00 am Dr. Eldridge Dace. Labs and EKG and H&P are up to date. Getting Covid swab tomorrow. Instruction letter sent through My Chart.   Called patient and explained all instructions as well.  Verbalizes understanding.

## 2020-06-27 NOTE — Telephone Encounter (Signed)
-----   Message from Pricilla Riffle, MD sent at 06/26/2020  1:18 PM EDT ----- Contacted patient   Discussed test results    She is still having problems with SOB, intermittent chest tightness, fatigue, N/    I would recomm R and L heart cath to define pressures and anatomy    Risks / benefits explained  She understands and agrees to proceed

## 2020-06-28 ENCOUNTER — Other Ambulatory Visit (HOSPITAL_COMMUNITY)
Admission: RE | Admit: 2020-06-28 | Discharge: 2020-06-28 | Disposition: A | Discharge status: Home / Self Care | Payer: BC Managed Care – PPO | Source: Ambulatory Visit | Attending: Interventional Cardiology | Admitting: Interventional Cardiology

## 2020-06-28 DIAGNOSIS — Z01812 Encounter for preprocedural laboratory examination: Secondary | ICD-10-CM | POA: Insufficient documentation

## 2020-06-28 DIAGNOSIS — I428 Other cardiomyopathies: Secondary | ICD-10-CM | POA: Diagnosis not present

## 2020-06-28 DIAGNOSIS — Z20822 Contact with and (suspected) exposure to covid-19: Secondary | ICD-10-CM | POA: Insufficient documentation

## 2020-06-28 DIAGNOSIS — Z79899 Other long term (current) drug therapy: Secondary | ICD-10-CM | POA: Diagnosis not present

## 2020-06-28 DIAGNOSIS — Z87891 Personal history of nicotine dependence: Secondary | ICD-10-CM | POA: Diagnosis not present

## 2020-06-28 DIAGNOSIS — Z882 Allergy status to sulfonamides status: Secondary | ICD-10-CM | POA: Diagnosis not present

## 2020-06-28 DIAGNOSIS — I5022 Chronic systolic (congestive) heart failure: Secondary | ICD-10-CM | POA: Diagnosis not present

## 2020-06-28 LAB — SARS CORONAVIRUS 2 (TAT 6-24 HRS): SARS Coronavirus 2: NEGATIVE

## 2020-06-30 ENCOUNTER — Telehealth: Payer: Self-pay | Admitting: *Deleted

## 2020-06-30 NOTE — Telephone Encounter (Signed)
Pt contacted pre-catheterization scheduled at [facility] for: Tuesday July 01, 2020 9 AM Verified arrival time and place: [facility] Main Entrance A [facility]) at: 7 AM   No solid food after midnight prior to cath, clear liquids until 5 AM day of procedure.   AM meds can be  taken pre-cath with sips of water including: ASA 81 mg   Confirmed patient has responsible adult to drive home post procedure and be with patient first 24 hours after arriving home: yes  You are allowed ONE visitor in the waiting room during the time you are at the hospital for your procedure. Both you and your visitor must wear a mask once you enter the hospital.       COVID-19 Pre-Screening Questions:  . In the past 14 days have you had a new cough, new headache, new nasal congestion, fever (100.4 or greater) unexplained body aches, new sore throat, or sudden loss of taste or sense of smell? no . In the past 14 days have you been around anyone with known Covid 19? No   Reviewed procedure/mask/visitor instructions, COVID-19 questions with patient.

## 2020-07-01 ENCOUNTER — Other Ambulatory Visit: Payer: Self-pay

## 2020-07-01 ENCOUNTER — Ambulatory Visit (HOSPITAL_COMMUNITY)
Admission: RE | Disposition: A | Discharge status: Home / Self Care | Payer: Self-pay | Source: Home / Self Care | Attending: Interventional Cardiology

## 2020-07-01 ENCOUNTER — Ambulatory Visit (HOSPITAL_COMMUNITY)
Admission: RE | Admit: 2020-07-01 | Discharge: 2020-07-01 | Disposition: A | Discharge status: Home / Self Care | Payer: BC Managed Care – PPO | Attending: Interventional Cardiology | Admitting: Interventional Cardiology

## 2020-07-01 DIAGNOSIS — I428 Other cardiomyopathies: Secondary | ICD-10-CM | POA: Insufficient documentation

## 2020-07-01 DIAGNOSIS — Z87891 Personal history of nicotine dependence: Secondary | ICD-10-CM | POA: Diagnosis not present

## 2020-07-01 DIAGNOSIS — R0602 Shortness of breath: Secondary | ICD-10-CM | POA: Diagnosis not present

## 2020-07-01 DIAGNOSIS — I5022 Chronic systolic (congestive) heart failure: Secondary | ICD-10-CM | POA: Insufficient documentation

## 2020-07-01 DIAGNOSIS — Z20822 Contact with and (suspected) exposure to covid-19: Secondary | ICD-10-CM | POA: Insufficient documentation

## 2020-07-01 DIAGNOSIS — Z882 Allergy status to sulfonamides status: Secondary | ICD-10-CM | POA: Diagnosis not present

## 2020-07-01 DIAGNOSIS — Z79899 Other long term (current) drug therapy: Secondary | ICD-10-CM | POA: Diagnosis not present

## 2020-07-01 DIAGNOSIS — R931 Abnormal findings on diagnostic imaging of heart and coronary circulation: Secondary | ICD-10-CM

## 2020-07-01 HISTORY — PX: RIGHT/LEFT HEART CATH AND CORONARY ANGIOGRAPHY: CATH118266

## 2020-07-01 LAB — POCT I-STAT EG7
Acid-Base Excess: 1 mmol/L (ref 0.0–2.0)
Bicarbonate: 26.7 mmol/L (ref 20.0–28.0)
Calcium, Ion: 1.25 mmol/L (ref 1.15–1.40)
HCT: 39 % (ref 36.0–46.0)
Hemoglobin: 13.3 g/dL (ref 12.0–15.0)
O2 Saturation: 73 %
Potassium: 3.8 mmol/L (ref 3.5–5.1)
Sodium: 143 mmol/L (ref 135–145)
TCO2: 28 mmol/L (ref 22–32)
pCO2, Ven: 46.4 mmHg (ref 44.0–60.0)
pH, Ven: 7.368 (ref 7.250–7.430)
pO2, Ven: 40 mmHg (ref 32.0–45.0)

## 2020-07-01 LAB — POCT I-STAT 7, (LYTES, BLD GAS, ICA,H+H)
Acid-Base Excess: 0 mmol/L (ref 0.0–2.0)
Bicarbonate: 25.8 mmol/L (ref 20.0–28.0)
Calcium, Ion: 1.25 mmol/L (ref 1.15–1.40)
HCT: 38 % (ref 36.0–46.0)
Hemoglobin: 12.9 g/dL (ref 12.0–15.0)
O2 Saturation: 95 %
Potassium: 3.8 mmol/L (ref 3.5–5.1)
Sodium: 142 mmol/L (ref 135–145)
TCO2: 27 mmol/L (ref 22–32)
pCO2 arterial: 43.6 mmHg (ref 32.0–48.0)
pH, Arterial: 7.38 (ref 7.350–7.450)
pO2, Arterial: 79 mmHg — ABNORMAL LOW (ref 83.0–108.0)

## 2020-07-01 SURGERY — RIGHT/LEFT HEART CATH AND CORONARY ANGIOGRAPHY
Anesthesia: LOCAL

## 2020-07-01 MED ORDER — MIDAZOLAM HCL 2 MG/2ML IJ SOLN
INTRAMUSCULAR | Status: AC
  Filled 2020-07-01: qty 2

## 2020-07-01 MED ORDER — ACETAMINOPHEN 325 MG PO TABS: 650.0000 mg | ORAL_TABLET | ORAL | Status: DC | PRN

## 2020-07-01 MED ORDER — SODIUM CHLORIDE 0.9 % IV SOLN: INTRAVENOUS | Status: DC

## 2020-07-01 MED ORDER — LIDOCAINE HCL (PF) 1 % IJ SOLN
INTRAMUSCULAR | Status: AC
  Filled 2020-07-01: qty 30

## 2020-07-01 MED ORDER — MIDAZOLAM HCL 2 MG/2ML IJ SOLN
INTRAMUSCULAR | Status: DC | PRN
  Administered 2020-07-01: 2 mg via INTRAVENOUS
  Administered 2020-07-01: 1 mg via INTRAVENOUS

## 2020-07-01 MED ORDER — SODIUM CHLORIDE 0.9 % IV SOLN: 250.0000 mL | INTRAVENOUS | Status: DC | PRN

## 2020-07-01 MED ORDER — LABETALOL HCL 5 MG/ML IV SOLN: 10.0000 mg | INTRAVENOUS | Status: DC | PRN

## 2020-07-01 MED ORDER — HYDRALAZINE HCL 20 MG/ML IJ SOLN: 10.0000 mg | INTRAMUSCULAR | Status: DC | PRN

## 2020-07-01 MED ORDER — ASPIRIN 81 MG PO CHEW: 81.0000 mg | CHEWABLE_TABLET | ORAL | Status: DC

## 2020-07-01 MED ORDER — FENTANYL CITRATE (PF) 100 MCG/2ML IJ SOLN
INTRAMUSCULAR | Status: AC
  Filled 2020-07-01: qty 2

## 2020-07-01 MED ORDER — LIDOCAINE HCL (PF) 1 % IJ SOLN
INTRAMUSCULAR | Status: DC | PRN
  Administered 2020-07-01: 21 mL
  Administered 2020-07-01: 4 mL

## 2020-07-01 MED ORDER — HEPARIN (PORCINE) IN NACL 1000-0.9 UT/500ML-% IV SOLN
INTRAVENOUS | Status: AC
  Filled 2020-07-01: qty 1000

## 2020-07-01 MED ORDER — ONDANSETRON HCL 4 MG/2ML IJ SOLN
4.0000 mg | Freq: Four times a day (QID) | INTRAMUSCULAR | Status: DC | PRN
  Administered 2020-07-01: 4 mg via INTRAVENOUS
  Filled 2020-07-01: qty 2

## 2020-07-01 MED ORDER — IOHEXOL 350 MG/ML SOLN
INTRAVENOUS | Status: DC | PRN
  Administered 2020-07-01: 70 mL

## 2020-07-01 MED ORDER — HEPARIN (PORCINE) IN NACL 1000-0.9 UT/500ML-% IV SOLN
INTRAVENOUS | Status: DC | PRN
  Administered 2020-07-01 (×2): 500 mL

## 2020-07-01 MED ORDER — SODIUM CHLORIDE 0.9 % IV SOLN: INTRAVENOUS | Status: AC

## 2020-07-01 MED ORDER — SODIUM CHLORIDE 0.9% FLUSH: 3.0000 mL | Freq: Two times a day (BID) | INTRAVENOUS | Status: DC

## 2020-07-01 MED ORDER — FENTANYL CITRATE (PF) 100 MCG/2ML IJ SOLN
INTRAMUSCULAR | Status: DC | PRN
  Administered 2020-07-01 (×2): 25 ug via INTRAVENOUS

## 2020-07-01 MED ORDER — SODIUM CHLORIDE 0.9% FLUSH: 3.0000 mL | INTRAVENOUS | Status: DC | PRN

## 2020-07-01 MED ORDER — HEPARIN SODIUM (PORCINE) 1000 UNIT/ML IJ SOLN
INTRAMUSCULAR | Status: AC
  Filled 2020-07-01: qty 1

## 2020-07-01 MED ORDER — VERAPAMIL HCL 2.5 MG/ML IV SOLN
INTRAVENOUS | Status: AC
  Filled 2020-07-01: qty 2

## 2020-07-01 SURGICAL SUPPLY — 15 items
CATH INFINITI 5FR AL1 (CATHETERS) ×2 IMPLANT
CATH INFINITI 5FR JL5 (CATHETERS) ×2 IMPLANT
CATH INFINITI 5FR MULTPACK ANG (CATHETERS) ×2 IMPLANT
CATH SWAN GANZ 7F STRAIGHT (CATHETERS) ×2 IMPLANT
GLIDESHEATH SLEND SS 6F .021 (SHEATH) ×2 IMPLANT
GLIDESHEATH SLENDER 7FR .021G (SHEATH) ×2 IMPLANT
GUIDEWIRE INQWIRE 1.5J.035X260 (WIRE) ×1 IMPLANT
INQWIRE 1.5J .035X260CM (WIRE) ×2
KIT HEART LEFT (KITS) ×2 IMPLANT
PACK CARDIAC CATHETERIZATION (CUSTOM PROCEDURE TRAY) ×2 IMPLANT
SHEATH PINNACLE 5F 10CM (SHEATH) ×2 IMPLANT
SHEATH PINNACLE 7F 10CM (SHEATH) ×2 IMPLANT
TRANSDUCER W/STOPCOCK (MISCELLANEOUS) ×2 IMPLANT
TUBING CIL FLEX 10 FLL-RA (TUBING) ×2 IMPLANT
WIRE EMERALD 3MM-J .035X150CM (WIRE) ×2 IMPLANT

## 2020-07-01 NOTE — Interval H&P Note (Signed)
Cath Lab Visit (complete for each Cath Lab visit)  Clinical Evaluation Leading to the Procedure:   ACS: Yes.    Non-ACS:    Anginal Classification: CCS IV  Anti-ischemic medical therapy: Minimal Therapy (1 class of medications)  Non-Invasive Test Results: No non-invasive testing performed  Prior CABG: No previous CABG  Low EF    History and Physical Interval Note:  07/01/2020 9:40 AM  Tonya Gardner  has presented today for surgery, with the diagnosis of chest tightness.  The various methods of treatment have been discussed with the patient and family. After consideration of risks, benefits and other options for treatment, the patient has consented to  Procedure(s): RIGHT/LEFT HEART CATH AND CORONARY ANGIOGRAPHY (N/A) as a surgical intervention.  The patient's history has been reviewed, patient examined, no change in status, stable for surgery.  I have reviewed the patient's chart and labs.  Questions were answered to the patient's satisfaction.     Lance Muss

## 2020-07-01 NOTE — Discharge Instructions (Signed)
Femoral Site Care This sheet gives you information about how to care for yourself after your procedure. Your health care provider may also give you more specific instructions. If you have problems or questions, contact your health care provider. What can I expect after the procedure? After the procedure, it is common to have:  Bruising that usually fades within 1-2 weeks.  Tenderness at the site. Follow these instructions at home: Wound care  Follow instructions from your health care provider about how to take care of your insertion site. Make sure you: ? Wash your hands with soap and water before you change your bandage (dressing). If soap and water are not available, use hand sanitizer. ? Change your dressing as told by your health care provider. ? Leave stitches (sutures), skin glue, or adhesive strips in place. These skin closures may need to stay in place for 2 weeks or longer. If adhesive strip edges start to loosen and curl up, you may trim the loose edges. Do not remove adhesive strips completely unless your health care provider tells you to do that.  Do not take baths, swim, or use a hot tub until your health care provider approves.  You may shower 24-48 hours after the procedure or as told by your health care provider. ? Gently wash the site with plain soap and water. ? Pat the area dry with a clean towel. ? Do not rub the site. This may cause bleeding.  Do not apply powder or lotion to the site. Keep the site clean and dry.  Check your femoral site every day for signs of infection. Check for: ? Redness, swelling, or pain. ? Fluid or blood. ? Warmth. ? Pus or a bad smell. Activity  For the first 2-3 days after your procedure, or as long as directed: ? Avoid climbing stairs as much as possible. ? Do not squat.  Do not lift anything that is heavier than 10 lb (4.5 kg), or the limit that you are told, until your health care provider says that it is safe.  Rest as  directed. ? Avoid sitting for a long time without moving. Get up to take short walks every 1-2 hours.  Do not drive for 24 hours if you were given a medicine to help you relax (sedative). General instructions  Take over-the-counter and prescription medicines only as told by your health care provider.  Keep all follow-up visits as told by your health care provider. This is important. Contact a health care provider if you have:  A fever or chills.  You have redness, swelling, or pain around your insertion site. Get help right away if:  The catheter insertion area swells very fast.  You pass out.  You suddenly start to sweat or your skin gets clammy.  The catheter insertion area is bleeding, and the bleeding does not stop when you hold steady pressure on the area.  The area near or just beyond the catheter insertion site becomes pale, cool, tingly, or numb. These symptoms may represent a serious problem that is an emergency. Do not wait to see if the symptoms will go away. Get medical help right away. Call your local emergency services (911 in the [place] Do not drive yourself to the hospital. Summary  After the procedure, it is common to have bruising that usually fades within 1-2 weeks.  Check your femoral site every day for signs of infection.  Do not lift anything that is heavier than 10 lb (4.5 kg), or the   limit that you are told, until your health care provider says that it is safe. This information is not intended to replace advice given to you by your health care provider. Make sure you discuss any questions you have with your health care provider. Document Revised: 08/22/2017 Document Reviewed: 08/22/2017 Elsevier Patient Education  2020 [employer]  

## 2020-07-01 NOTE — Progress Notes (Signed)
Patient was given discharge instructions. She verbalized understanding. 

## 2020-07-02 ENCOUNTER — Encounter (HOSPITAL_COMMUNITY): Payer: Self-pay | Admitting: Interventional Cardiology

## 2020-07-02 MED FILL — Verapamil HCl IV Soln 2.5 MG/ML: INTRAVENOUS | Qty: 2 | Status: AC

## 2020-07-02 MED FILL — Heparin Sodium (Porcine) Inj 1000 Unit/ML: INTRAMUSCULAR | Qty: 10 | Status: AC

## 2020-07-02 NOTE — Telephone Encounter (Signed)
BMET in 10 days from starting [facility]

## 2020-07-02 NOTE — Telephone Encounter (Signed)
PT can take Zofran as needed May also be worthwile to try Protonix daily Heart catheterization showe normal filling pressures  Normal coronary arteries  Pumping function mod decreased She is on Toprol XL  She should also try Entresto 24/26 bid    F/U in clinic in a couple wks.

## 2020-07-03 ENCOUNTER — Telehealth: Payer: Self-pay | Admitting: *Deleted

## 2020-07-03 DIAGNOSIS — R0602 Shortness of breath: Secondary | ICD-10-CM

## 2020-07-03 MED ORDER — ONDANSETRON 8 MG PO TBDP: 8.0000 mg | ORAL_TABLET | Freq: Two times a day (BID) | ORAL | 0 refills | Status: DC | PRN

## 2020-07-03 MED ORDER — SACUBITRIL-VALSARTAN 24-26 MG PO TABS: 1.0000 | ORAL_TABLET | Freq: Two times a day (BID) | ORAL | 11 refills | Status: DC

## 2020-07-03 MED ORDER — PANTOPRAZOLE SODIUM 40 MG PO TBEC: 40.0000 mg | DELAYED_RELEASE_TABLET | Freq: Every day | ORAL | 2 refills | Status: DC

## 2020-07-03 NOTE — Telephone Encounter (Signed)
Message from Dr. Tenny Craw re: patient's MyChart message 07/02/20:  PT can take Zofran as needed May also be worthwile to try Protonix daily Heart catheterization showe normal filling pressures  Normal coronary arteries  Pumping function mod decreased She is on Toprol XL  She should also try Entresto 24/26 bid    F/U in clinic in a couple wks.   BMET 10 days after starting Entresto _______________________________________________________________________________ I spoke with the patient and informed of the above recommendations. She has verbalized understanding and agreement. Lab appointment and follow up with Dr. Tenny Craw scheduled.

## 2020-07-04 ENCOUNTER — Telehealth: Payer: Self-pay | Admitting: Internal Medicine

## 2020-07-04 NOTE — Telephone Encounter (Signed)
Called patient  Reviewed cath resutls Tonya Gardner just called in   Plan for use meds   Follow up echo in 3 months from original Pt has appt with me in Dec to reevaluatie

## 2020-07-14 ENCOUNTER — Other Ambulatory Visit: Payer: Self-pay

## 2020-07-14 ENCOUNTER — Other Ambulatory Visit: Payer: BC Managed Care – PPO | Admitting: *Deleted

## 2020-07-14 DIAGNOSIS — R0602 Shortness of breath: Secondary | ICD-10-CM

## 2020-07-14 LAB — BASIC METABOLIC PANEL
BUN/Creatinine Ratio: 18 (ref 9–23)
BUN: 13 mg/dL (ref 6–24)
CO2: 25 mmol/L (ref 20–29)
Calcium: 9.6 mg/dL (ref 8.7–10.2)
Chloride: 105 mmol/L (ref 96–106)
Creatinine, Ser: 0.74 mg/dL (ref 0.57–1.00)
GFR calc Af Amer: 110 mL/min/{1.73_m2} (ref >59)
GFR calc non Af Amer: 95 mL/min/{1.73_m2} (ref >59)
Glucose: 71 mg/dL (ref 65–99)
Potassium: 4.7 mmol/L (ref 3.5–5.2)
Sodium: 141 mmol/L (ref 134–144)

## 2020-07-27 NOTE — Progress Notes (Signed)
Cardiology Office Note   Date:  07/28/2020   ID:  Tonya Gardner, DOB 1971/08/13, MRN [id number]  PCP:  Pcp, No  Cardiologist:   Dietrich Pates, MD   Pt presents for f/u of CHF   History of Present Illness: Tonya Gardner is a 49 y.o. female with a history of Tetralogy f Fallot (s/p repair in 1975), bradycardia (symptomatic in 2002; endo of life 2012).   She was recently admitted to [facility] for dizzyess, SOB, diaphoresis.   EKG showed ST   Slowed with adenosine then converted to SR I saw her in consult when she was admitted to [facility]   She said prior to the admission she had been doing OK   Had just gotten back from [place]  Did fine with walking   No SOB   She denied palpitatoins prior   Echo was done on 10/11 and 10/12  SHowed moderate LV and RV dysfunction.   No echo prior     The pt was sent home with plan for f/u in clinic and  echo in a few months   She was sent home on Toprol The pt continued to feel badly, SOB   Set up for R and L heart catheterization   This showed normal Pressures, normal coronary arteries     Since then she has been placed on Entresto  She says she wakes up feeling good, then fades as day goes on   She is still having palpitations   Spells self limited   No syncope   She does feel bad when occur  She is back to work at dental office    Current Meds  Medication Sig  . cetirizine (ZYRTEC) 10 MG tablet Take 10 mg by mouth daily. For allergies  . ibuprofen (ADVIL) 200 MG tablet Take 400 mg by mouth every 8 (eight) hours as needed (pain.).  Marland Kitchen metoprolol succinate (TOPROL-XL) 25 MG 24 hr tablet Take 1 tablet (25 mg total) by mouth daily. (Patient taking differently: Take 25 mg by mouth every evening. )  . ondansetron (ZOFRAN ODT) 8 MG disintegrating tablet Take 1 tablet (8 mg total) by mouth 2 (two) times daily as needed for nausea.  . pantoprazole (PROTONIX) 40 MG tablet Take 1 tablet (40 mg total) by mouth daily.  . sacubitril-valsartan (ENTRESTO) 24-26 MG Take 1  tablet by mouth 2 (two) times daily.     Allergies:   Sulfonamide derivatives and Latex   Past Medical History:  Diagnosis Date  . CHF (congestive heart failure) (HCC) 2002   with pregnancy  . Headache   . Presence of permanent cardiac pacemaker    off for 2 years  . Seizures (HCC) 2002   due to eclampsia  . Sinus bradycardia   . Tetralogy of Fallot s/p repair 1975    Past Surgical History:  Procedure Laterality Date  . ABDOMINAL HYSTERECTOMY N/A 08/12/2014   Procedure: HYSTERECTOMY ABDOMINAL;  Surgeon: Jeani Hawking, MD;  Location: [facility];  Service: Gynecology;  Laterality: N/A;  . CESAREAN SECTION    . LAPAROSCOPIC ASSISTED VAGINAL HYSTERECTOMY N/A 08/12/2014   Procedure: ATTEMPTED LAPAROSCOPIC ASSISTED VAGINAL HYSTERECTOMY;  Surgeon: Jeani Hawking, MD;  Location: [facility];  Service: Gynecology;  Laterality: N/A;  . PACEMAKER INSERTION     [employer]   . RIGHT/LEFT HEART CATH AND CORONARY ANGIOGRAPHY N/A 07/01/2020   Procedure: RIGHT/LEFT HEART CATH AND CORONARY ANGIOGRAPHY;  Surgeon: Corky Crafts, MD;  Location[facility];  Service: Cardiovascular;  Laterality: N/A;  . SALPINGOOPHORECTOMY Bilateral 08/12/2014   Procedure: SALPINGO OOPHORECTOMY;  Surgeon: Jeani Hawking, MD;  Location: [facility];  Service: Gynecology;  Laterality: Bilateral;  . TETRALOGY OF FALLOT REPAIR    . TONSILLECTOMY       Social History:  The patient  reports that she has quit smoking. She has never used smokeless tobacco. She reports current alcohol use. She reports that she does not use drugs.   Family History:  The patient's family history is not on file.    ROS:  Please see the history of present illness. All other systems are reviewed and  Negative to the above problem except as noted.    PHYSICAL EXAM: VS:  BP 102/70   Pulse (!) 58   Ht 5\' 2"  (1.575 m)   Wt 179 lb 6.4 oz (81.4 kg)   LMP 07/23/2014 (Approximate)   SpO2 99%   BMI 32.81 kg/m   GEN: Well nourished,  well developed, in no acute distress  HEENT: normal  Neck: no JVD, carotid bruits Cardiac: RRR; no murmurs  No LE edema  Respiratory:  clear to auscultation bilaterally, GI: soft, nontender, nondistended, + BS  No hepatomegaly  MS: no deformity Moving all extremities   Skin: warm and dry, no rash Neuro:  Strength and sensation are intact Psych: euthymic mood, full affect   EKG:  EKG is not ordered today.  Echo  06/02/20  1. Poor acoustic windows limiit. 2. Left ventricular ejection fraction, by estimation, is 35 to 40%. The left ventricle has moderately decreased function. The left ventricle demonstrates global hypokinesis. There is mild left ventricular hypertrophy. Left ventricular diastolic parameters are consistent with Grade III diastolic dysfunction (restrictive). 3. Right ventricular systolic function moderate to severely reduced. The right ventricular size is moderately enlarged. 4. Left atrial size was moderately dilated. 5. Right atrial size was moderately dilated. 6. The mitral valve is normal in structure. Moderate mitral valve regurgitation. 7. The aortic valve is normal in structure. Aortic valve regurgitation is not visualized. 8. The inferior vena cava is normal in size with <50% respiratory variability, suggesting right atrial pressure of 8 mmHg.  Limited echo 10.12.21  Limited study. 2. Left ventricular ejection fraction, by estimation, is 35 to 40%. The left ventricle has moderately decreased function. The left ventricle demonstrates global hypokinesis. No obvious LV mural thrombus. 3. Right ventricular systolic function is moderately reduced. The right ventricular size is moderately enlarged. Mildly increased right ventricular wall thickness. 4. The inferior vena cava is normal in size with greater than 50% respiratory variability, suggesting right atrial pressure of 3 mmHg.  R and L heart cath 07/01/20  No left main. Separate ostia of LAD and circumflex.  No angiographically apparent CAD.  There is mild to moderate left ventricular systolic dysfunction.  LV end diastolic pressure is normal.  The left ventricular ejection fraction is 35-45% by visual estimate.  There is no aortic valve stenosis.  Ao 95%, PA 73%, PA pressure 21/6, mean PA pressure 12 mm Hg; PCWP 9 mm Hg; CO 5.9 L/min; CI 3.29  Normal right heart pressures.   Continue preventive therapy. Nonischemic cardiomyopathy.     Lipid Panel No results found for: CHOL, TRIG, HDL, CHOLHDL, VLDL, LDLCALC, LDLDIRECT    Wt Readings from Last 3 Encounters:  07/28/20 179 lb 6.4 oz (81.4 kg)  07/01/20 175 lb (79.4 kg)  06/16/20 183 lb 9.6 oz (83.3 kg)      ASSESSMENT AND PLAN:  1  Chronic systolic CHF   Volume status looks good  Recnet cath shows nonischemiic and filling pressures were OK Keep on medical Rx   Wil see in a few wks    Echoc in February to reassess LVEF   2  Tachycardia   Soulnds like SVT   Will set up for a 30 day monitor to try to capture   Discussed vagal maneuvers    F/U based on results  I have reviewed with EP  3  Hx of TOF  S/p repair in 1975        Current medicines are reviewed at length with the patient today.  The patient does not have concerns regarding medicines.  Signed, Dietrich Pates, MD  07/28/2020 11:13 PM    [facility] [address] [place], [place], [place]  [address] Phone: [phone]; Fax: [phone]

## 2020-07-28 ENCOUNTER — Other Ambulatory Visit: Payer: Self-pay

## 2020-07-28 ENCOUNTER — Ambulatory Visit (INDEPENDENT_AMBULATORY_CARE_PROVIDER_SITE_OTHER): Payer: BC Managed Care – PPO | Admitting: Internal Medicine

## 2020-07-28 ENCOUNTER — Encounter: Payer: Self-pay | Admitting: Internal Medicine

## 2020-07-28 VITALS — BP 102/70 | HR 58 | Ht 62.0 in | Wt 179.4 lb

## 2020-07-28 DIAGNOSIS — R0602 Shortness of breath: Secondary | ICD-10-CM

## 2020-07-28 DIAGNOSIS — R000 Tachycardia, unspecified: Secondary | ICD-10-CM

## 2020-07-28 DIAGNOSIS — R002 Palpitations: Secondary | ICD-10-CM

## 2020-07-28 NOTE — Patient Instructions (Addendum)
Medication Instructions:  No changes *If you need a refill on your cardiac medications before your next appointment, please call your pharmacy*   Lab Work: BMET/BNP today If you have labs (blood work) drawn today and your tests are completely normal, you will receive your results only by: Marland Kitchen MyChart Message (if you have MyChart) OR . A paper copy in the mail If you have any lab test that is abnormal or we need to change your treatment, we will call you to review the results.   Testing/Procedures: Your physician has recommended that you wear an event monitor. Event monitors are medical devices that record the heart's electrical activity. Doctors most often [place] these monitors to diagnose arrhythmias. Arrhythmias are problems with the speed or rhythm of the heartbeat. The monitor is a small, portable device. You can wear one while you do your normal daily activities. This is usually used to diagnose what is causing palpitations/syncope (passing out).  Your physician has requested that you have an echocardiogram. Echocardiography is a painless test that uses sound waves to create images of your heart. It provides your doctor with information about the size and shape of your heart and how well your heart's chambers and valves are working. This procedure takes approximately one hour. There are no restrictions for this procedure. ECHO IS DUE IN February 2022    Follow-Up: Will call with results  Other Instructions

## 2020-07-29 ENCOUNTER — Encounter (HOSPITAL_COMMUNITY): Payer: Self-pay | Admitting: Emergency Medicine

## 2020-07-29 ENCOUNTER — Emergency Department (HOSPITAL_COMMUNITY)
Admission: EM | Admit: 2020-07-29 | Discharge: 2020-07-29 | Disposition: A | Discharge status: Home / Self Care | Payer: BC Managed Care – PPO | Attending: Emergency Medicine | Admitting: Emergency Medicine

## 2020-07-29 ENCOUNTER — Other Ambulatory Visit: Payer: Self-pay

## 2020-07-29 ENCOUNTER — Telehealth: Payer: Self-pay | Admitting: Internal Medicine

## 2020-07-29 ENCOUNTER — Emergency Department (HOSPITAL_COMMUNITY): Payer: BC Managed Care – PPO

## 2020-07-29 DIAGNOSIS — I471 Supraventricular tachycardia, unspecified: Secondary | ICD-10-CM

## 2020-07-29 DIAGNOSIS — Z8679 Personal history of other diseases of the circulatory system: Secondary | ICD-10-CM | POA: Insufficient documentation

## 2020-07-29 DIAGNOSIS — I110 Hypertensive heart disease with heart failure: Secondary | ICD-10-CM | POA: Diagnosis not present

## 2020-07-29 DIAGNOSIS — I509 Heart failure, unspecified: Secondary | ICD-10-CM | POA: Insufficient documentation

## 2020-07-29 DIAGNOSIS — Z91040 Latex allergy status: Secondary | ICD-10-CM | POA: Insufficient documentation

## 2020-07-29 DIAGNOSIS — R002 Palpitations: Secondary | ICD-10-CM

## 2020-07-29 DIAGNOSIS — Z87891 Personal history of nicotine dependence: Secondary | ICD-10-CM | POA: Insufficient documentation

## 2020-07-29 DIAGNOSIS — Z0189 Encounter for other specified special examinations: Secondary | ICD-10-CM

## 2020-07-29 DIAGNOSIS — Z950 Presence of cardiac pacemaker: Secondary | ICD-10-CM | POA: Insufficient documentation

## 2020-07-29 LAB — BASIC METABOLIC PANEL
Anion gap: 11 (ref 5–15)
BUN/Creatinine Ratio: 19 (ref 9–23)
BUN: 13 mg/dL (ref 6–24)
BUN: 20 mg/dL (ref 6–20)
CO2: 23 mmol/L (ref 20–29)
CO2: 21 mmol/L — ABNORMAL LOW (ref 22–32)
Calcium: 9.5 mg/dL (ref 8.7–10.2)
Calcium: 9.2 mg/dL (ref 8.9–10.3)
Chloride: 104 mmol/L (ref 96–106)
Chloride: 107 mmol/L (ref 98–111)
Creatinine, Ser: 0.68 mg/dL (ref 0.57–1.00)
Creatinine, Ser: 0.79 mg/dL (ref 0.44–1.00)
GFR calc Af Amer: 119 mL/min/{1.73_m2} (ref >59)
GFR calc non Af Amer: 103 mL/min/{1.73_m2} (ref >59)
GFR, Estimated: >60 mL/min (ref >60)
Glucose, Bld: 116 mg/dL — ABNORMAL HIGH (ref 70–99)
Glucose: 74 mg/dL (ref 65–99)
Potassium: 4.1 mmol/L (ref 3.5–5.2)
Potassium: 3.5 mmol/L (ref 3.5–5.1)
Sodium: 140 mmol/L (ref 134–144)
Sodium: 139 mmol/L (ref 135–145)

## 2020-07-29 LAB — CBC
HCT: 45.1 % (ref 36.0–46.0)
Hemoglobin: 14.5 g/dL (ref 12.0–15.0)
MCH: 27.7 pg (ref 26.0–34.0)
MCHC: 32.2 g/dL (ref 30.0–36.0)
MCV: 86.2 fL (ref 80.0–100.0)
Platelets: 280 10*3/uL (ref 150–400)
RBC: 5.23 MIL/uL — ABNORMAL HIGH (ref 3.87–5.11)
RDW: 14.1 % (ref 11.5–15.5)
WBC: 6.9 10*3/uL (ref 4.0–10.5)
nRBC: 0 % (ref 0.0–0.2)

## 2020-07-29 LAB — PRO B NATRIURETIC PEPTIDE: NT-Pro BNP: 161 pg/mL (ref 0–249)

## 2020-07-29 MED ORDER — ETOMIDATE 2 MG/ML IV SOLN: 10.0000 mg | Freq: Once | INTRAVENOUS | Status: DC

## 2020-07-29 MED ORDER — ONDANSETRON HCL 4 MG/2ML IJ SOLN: 4.0000 mg | Freq: Once | INTRAMUSCULAR | Status: DC

## 2020-07-29 MED ORDER — ADENOSINE 6 MG/2ML IV SOLN
INTRAVENOUS | Status: AC
  Filled 2020-07-29: qty 4

## 2020-07-29 MED ORDER — ADENOSINE 6 MG/2ML IV SOLN: 12.0000 mg | Freq: Once | INTRAVENOUS | Status: DC

## 2020-07-29 MED ORDER — ADENOSINE 6 MG/2ML IV SOLN
INTRAVENOUS | Status: AC
  Filled 2020-07-29: qty 6

## 2020-07-29 MED ORDER — ONDANSETRON HCL 4 MG/2ML IJ SOLN
INTRAMUSCULAR | Status: AC
  Administered 2020-07-29: 8 mg
  Filled 2020-07-29: qty 4

## 2020-07-29 MED ORDER — LACTATED RINGERS IV BOLUS
500.0000 mL | Freq: Once | INTRAVENOUS | Status: AC
  Administered 2020-07-29: 500 mL via INTRAVENOUS

## 2020-07-29 MED ORDER — ONDANSETRON 4 MG PO TBDP: ORAL_TABLET | ORAL | 0 refills | Status: DC

## 2020-07-29 MED ORDER — FENTANYL CITRATE (PF) 100 MCG/2ML IJ SOLN
INTRAMUSCULAR | Status: AC
  Filled 2020-07-29: qty 2

## 2020-07-29 MED ORDER — ADENOSINE 6 MG/2ML IV SOLN: 18.0000 mg | Freq: Once | INTRAVENOUS | Status: DC

## 2020-07-29 MED ORDER — ADENOSINE 6 MG/2ML IV SOLN: 6.0000 mg | Freq: Once | INTRAVENOUS | Status: DC

## 2020-07-29 MED ORDER — FENTANYL CITRATE (PF) 100 MCG/2ML IJ SOLN: 50.0000 ug | Freq: Once | INTRAMUSCULAR | Status: DC

## 2020-07-29 MED ORDER — ETOMIDATE 2 MG/ML IV SOLN
INTRAVENOUS | Status: AC
  Filled 2020-07-29: qty 10

## 2020-07-29 NOTE — Telephone Encounter (Signed)
Patient returning call.

## 2020-07-29 NOTE — Telephone Encounter (Signed)
Spoke with pt    She has appt with Tonya Gardner next Tues for symptomatic SVT I had ordered monitor yesterday   This can be cancelled   We have info that is needed  Told her to stop Entresto and increase Toprol XL 25 to bid

## 2020-07-29 NOTE — ED Triage Notes (Addendum)
Pt states she has been up since 2am with palpitations, HR in 190's, diphoresis, and nausea. States she is seen by Cardiologist and was seen here for same about 2 months ago. Also c/o intermittent chest pain with "coldness, numbness, and tingling to L arm".

## 2020-07-29 NOTE — ED Notes (Signed)
Pt educated on follow up care and care following medication for cardioversion.  Pt verbalized understanding to no make big life decision, no driving and understanding need of when to return to hte ER.

## 2020-07-29 NOTE — Telephone Encounter (Signed)
Pt calling to let Dr. Tenny Craw know about her ED visit today. She was unaware she had an appt with EP next week. We reviewed the date and time. She agreed to come in to be seen.  Will forward to Dr. Tenny Craw and her RN for review and/or further recommendation.   She had no additional needs at this time.

## 2020-07-29 NOTE — Telephone Encounter (Signed)
LVM for return call. 

## 2020-07-29 NOTE — ED Notes (Signed)
Pt medicated per below:  Adenosine 6 mg 0626 Adenosine 12mg  0632 Adenosine 18 mg 0635 Fentanyl 0635 Etomidate 10mg  0636 Shock 0637 HR 60-70 bpm 0645

## 2020-07-29 NOTE — ED Notes (Signed)
Pt cardioverted upon initial assessment.  Pt given 8mg  zofran for nausea.  Pt HR 60's.

## 2020-07-29 NOTE — Telephone Encounter (Signed)
Pt aware. Pt contacted by me before I realized Dr. Tenny Craw had contacted her. Pt and I reviewed recommendation. She had no additional questions.  Per Dr. Tenny Craw: "The pt has an appt with Rosette Reveal next Tuesday  He recomm:  Stopping Entresto  Increase Toprol XL to 25 bid  If has another spell that is prolonged and doesn't break then go to ED  otherwise plan appt on Tuesday"

## 2020-07-29 NOTE — Telephone Encounter (Signed)
    Pt would like to speak with Dr. Tenny Craw or her nurse. Pt just got home from ED and she would like to discuss them about her ED visit

## 2020-07-29 NOTE — ED Provider Notes (Signed)
[facility] EMERGENCY DEPARTMENT Provider Note   CSN: [id number] Arrival date & time: 07/29/20  0459     History Chief Complaint  Patient presents with  . Palpitations    Tonya Gardner is a 49 y.o. female.  HPI     Past Medical History:  Diagnosis Date  . CHF (congestive heart failure) (HCC) 2002   with pregnancy  . Headache   . Presence of permanent cardiac pacemaker    off for 2 years  . Seizures (HCC) 2002   due to eclampsia  . Sinus bradycardia   . Tetralogy of Fallot s/p repair 1975    Patient Active Problem List   Diagnosis Date Noted  . Abnormal echocardiogram   . Shortness of breath   . Paroxysmal A-fib (HCC) 06/02/2020  . Supraventricular tachycardia (HCC) 06/02/2020  . History of CHF (congestive heart failure) 06/02/2020  . Hyperglycemia 06/02/2020  . Obesity (BMI 30-39.9) 06/02/2020  . History of tetralogy of Fallot 06/02/2020  . Menorrhagia 08/12/2014  . ESSENTIAL HYPERTENSION, BENIGN 10/16/2009  . CARDIAC PACEMAKER IN SITU 10/16/2009  . SINUS BRADYCARDIA 10/13/2009  . CHF 10/13/2009    Past Surgical History:  Procedure Laterality Date  . ABDOMINAL HYSTERECTOMY N/A 08/12/2014   Procedure: HYSTERECTOMY ABDOMINAL;  Surgeon: Jeani Hawking, MD;  Location: [facility];  Service: Gynecology;  Laterality: N/A;  . CESAREAN SECTION    . LAPAROSCOPIC ASSISTED VAGINAL HYSTERECTOMY N/A 08/12/2014   Procedure: ATTEMPTED LAPAROSCOPIC ASSISTED VAGINAL HYSTERECTOMY;  Surgeon: Jeani Hawking, MD;  Location: [facility];  Service: Gynecology;  Laterality: N/A;  . PACEMAKER INSERTION     [employer]   . RIGHT/LEFT HEART CATH AND CORONARY ANGIOGRAPHY N/A 07/01/2020   Procedure: RIGHT/LEFT HEART CATH AND CORONARY ANGIOGRAPHY;  Surgeon: Corky Crafts, MD;  Location: [facility] INVASIVE CV LAB;  Service: Cardiovascular;  Laterality: N/A;  . SALPINGOOPHORECTOMY Bilateral 08/12/2014   Procedure: SALPINGO OOPHORECTOMY;  Surgeon: Jeani Hawking, MD;  Location: [facility];   Service: Gynecology;  Laterality: Bilateral;  . TETRALOGY OF FALLOT REPAIR    . TONSILLECTOMY       OB History    Gravida  3   Para      Term      Preterm      AB      Living        SAB      TAB      Ectopic      Multiple      Live Births  2           History reviewed. No pertinent family history.  Social History   Tobacco Use  . Smoking status: Former Games developer  . Smokeless tobacco: Never Used  Vaping Use  . Vaping Use: Never used  Substance Use Topics  . Alcohol use: Yes    Comment: occasionally  . Drug use: No    Home Medications Prior to Admission medications   Medication Sig Start Date End Date Taking? Authorizing Provider  cetirizine (ZYRTEC) 10 MG tablet Take 10 mg by mouth daily. For allergies    [provider]  ibuprofen (ADVIL) 200 MG tablet Take 400 mg by mouth every 8 (eight) hours as needed (pain.).    [provider]  metoprolol succinate (TOPROL-XL) 25 MG 24 hr tablet Take 1 tablet (25 mg total) by mouth daily. Patient taking differently: Take 25 mg by mouth every evening.  06/24/20   Pricilla Riffle, MD  ondansetron (ZOFRAN ODT) 8 MG  disintegrating tablet Take 1 tablet (8 mg total) by mouth 2 (two) times daily as needed for nausea. 07/03/20   Pricilla Riffle, MD  pantoprazole (PROTONIX) 40 MG tablet Take 1 tablet (40 mg total) by mouth daily. 07/03/20   Pricilla Riffle, MD  sacubitril-valsartan (ENTRESTO) 24-26 MG Take 1 tablet by mouth 2 (two) times daily. 07/03/20   Pricilla Riffle, MD    Allergies    Sulfonamide derivatives and Latex  Review of Systems   Review of Systems  Physical Exam Updated Vital Signs BP 92/71   Pulse 65   Temp 97.7 F (36.5 C) (Oral)   Resp (!) 22   Ht 5\' 2"  (1.575 m)   Wt 81 kg   LMP 07/23/2014 (Approximate)   SpO2 100%   BMI 32.66 kg/m   Physical Exam  ED Results / Procedures / Treatments   Labs (all labs ordered are listed, but only abnormal results are displayed) Labs Reviewed    BASIC METABOLIC PANEL - Abnormal; Notable for the following components:      Result Value   CO2 21 (*)    Glucose, Bld 116 (*)    All other components within normal limits  CBC - Abnormal; Notable for the following components:   RBC 5.23 (*)    All other components within normal limits    EKG EKG Interpretation  Date/Time:  Tuesday July 29 2020 05:50:23 EST Ventricular Rate:  178 PR Interval:    QRS Duration: 152 QT Interval:  332 QTC Calculation: 571 R Axis:   97 Text Interpretation: Wide QRS tachycardia Right bundle branch block Abnormal ECG likely SVT, similar morphology to previous wide qrs because of RBBB that has been present for years, NOT consistent with vtach Confirmed by 03-07-1971 [phone]) on 07/29/2020 6:19:40 AM   Radiology No results found.  Procedures .Critical Care Performed by: 14/02/2020, MD Authorized by: Marily Memos, MD   Critical care provider statement:    Critical care time (minutes):  45   Critical care was necessary to treat or prevent imminent or life-threatening deterioration of the following conditions:  Circulatory failure   Critical care was time spent personally by me on the following activities:  Discussions with consultants, evaluation of patient's response to treatment, examination of patient, ordering and performing treatments and interventions, ordering and review of laboratory studies, ordering and review of radiographic studies, pulse oximetry, re-evaluation of patient's condition, obtaining history from patient or surrogate and review of old charts .Cardioversion  Date/Time: 07/29/2020 8:41 AM Performed by: 14/02/2020, MD Authorized by: Marily Memos, MD   Consent:    Consent obtained:  Verbal   Consent given by:  Patient   Risks discussed:  Pain, induced arrhythmia, death and cutaneous burn   Alternatives discussed:  No treatment, rate-control medication, alternative treatment, referral, observation and delayed  treatment Pre-procedure details:    Cardioversion basis:  Emergent   Rhythm:  Supraventricular tachycardia   Electrode placement:  Anterior-posterior Patient sedated: Yes. Refer to sedation procedure documentation for details of sedation.  Attempt one:    Cardioversion mode:  Synchronous   Waveform:  Monophasic   Shock (Joules):  120   Shock outcome:  Conversion to normal sinus rhythm Post-procedure details:    Patient status:  Awake   Patient tolerance of procedure:  Tolerated well, no immediate complications .Sedation  Date/Time: 07/29/2020 8:42 AM Performed by: 14/02/2020, MD Authorized by: Marily Memos, MD   Consent:    Consent obtained:  Verbal   Consent given by:  Patient   Risks discussed:  Allergic reaction, dysrhythmia, inadequate sedation, nausea, prolonged hypoxia resulting in organ damage, prolonged sedation necessitating reversal, respiratory compromise necessitating ventilatory assistance and intubation and vomiting   Alternatives discussed:  Analgesia without sedation, anxiolysis and regional anesthesia Universal protocol:    Procedure explained and questions answered to patient or proxy's satisfaction: yes     Relevant documents present and verified: yes     Test results available and properly labeled: yes     Imaging studies available: yes     Required blood products, implants, devices, and special equipment available: yes     Site/side marked: yes     Immediately prior to procedure a time out was called: yes     Patient identity confirmation method:  Verbally with patient Indications:    Procedure necessitating sedation performed by:  Physician performing sedation Pre-sedation assessment:    Time since last food or drink:  >4 hours   ASA classification: class 1 - normal, healthy patient     Neck mobility: normal     Mouth opening:  3 or more finger widths   Thyromental distance:  4 finger widths   Mallampati score:  I - soft palate, uvula, fauces,  pillars visible   Pre-sedation assessments completed and reviewed: airway patency, cardiovascular function, hydration status, mental status, nausea/vomiting, pain level, respiratory function and temperature   Immediate pre-procedure details:    Reassessment: Patient reassessed immediately prior to procedure     Reviewed: vital signs, relevant labs/tests and NPO status     Verified: bag valve mask available, emergency equipment available, intubation equipment available, IV patency confirmed, oxygen available and suction available   Procedure details (see MAR for exact dosages):    Preoxygenation:  Nasal cannula   Sedation:  Etomidate   Intended level of sedation: deep   Analgesia:  Fentanyl   Intra-procedure monitoring:  Blood pressure monitoring, cardiac monitor, continuous pulse oximetry, frequent LOC assessments, frequent vital sign checks and continuous capnometry   Intra-procedure events: none     Intra-procedure management:  Supplemental oxygen   Total Provider sedation time (minutes):  19 Post-procedure details:    Post-sedation assessment completed:  07/29/2020 8:42 AM   Attendance: Constant attendance by certified staff until patient recovered     Recovery: Patient returned to pre-procedure baseline     Post-sedation assessments completed and reviewed: airway patency, cardiovascular function, hydration status, mental status, nausea/vomiting, pain level, respiratory function and temperature     Patient is stable for discharge or admission: yes     Patient tolerance:  Tolerated well, no immediate complications   (including critical care time)  Medications Ordered in ED Medications  adenosine (ADENOCARD) 6 MG/2ML injection 6 mg (has no administration in time range)  adenosine (ADENOCARD) 6 MG/2ML injection 12 mg (has no administration in time range)  adenosine (ADENOCARD) 6 MG/2ML injection 18 mg (has no administration in time range)  ondansetron (ZOFRAN) injection 4 mg (has no  administration in time range)  fentaNYL (SUBLIMAZE) injection 50 mcg (has no administration in time range)  etomidate (AMIDATE) injection 10 mg (has no administration in time range)  adenosine (ADENOCARD) 6 MG/2ML injection (  Given 07/29/20 0626)  adenosine (ADENOCARD) 6 MG/2ML injection (  Given 07/29/20 0629)  etomidate (AMIDATE) 2 MG/ML injection (  Given 07/29/20 0636)  fentaNYL (SUBLIMAZE) 100 MCG/2ML injection (  Given 07/29/20 0635)  ondansetron (ZOFRAN) 4 MG/2ML injection (8 mg  Given 07/29/20 0727)  lactated ringers bolus 500 mL (500 mLs Intravenous New Bag/Given 07/29/20 0756)    ED Course  I have reviewed the triage vital signs and the nursing notes.  Pertinent labs & imaging results that were available during my care of the patient were reviewed by me and considered in my medical decision making (see chart for details).    MDM Rules/Calculators/A&P                          Patient came in with SVT 180-190 borderline unstable with soft pressures, diaphoresis pale appearance slightly sleepy.  Tried adenosine 6, 12, 18 without any change at all in her rhythm even a pause.  Elected to emergently cardiovert for unstable SVT.  Etomidate, fentanyl as above.  Cardioverted easily.  Will not make any medication changes as the patient already has some heart rates that get in the 50s on her home metoprolol.  Messages sent to her cardiologist to make them aware.  Patient is tolerating p.o.  She appears well feels well and is ready for discharge.  Final Clinical Impression(s) / ED Diagnoses Final diagnoses:  Palpitations    Rx / DC Orders ED Discharge Orders    None       Shawnae Leiva, Barbara Cower, MD 07/29/20 [phone]

## 2020-07-30 MED ORDER — METOPROLOL SUCCINATE ER 25 MG PO TB24: 25.0000 mg | ORAL_TABLET | Freq: Two times a day (BID) | ORAL | 3 refills | Status: DC

## 2020-07-30 NOTE — Addendum Note (Signed)
Addended by: Lendon Ka on: 07/30/2020 10:15 AM   Modules accepted: Orders

## 2020-07-30 NOTE — Telephone Encounter (Signed)
Medication list updated per most recent orders below.

## 2020-07-31 MED FILL — Medication: Qty: 1 | Status: AC

## 2020-08-05 ENCOUNTER — Ambulatory Visit (INDEPENDENT_AMBULATORY_CARE_PROVIDER_SITE_OTHER): Payer: BC Managed Care – PPO | Admitting: Internal Medicine

## 2020-08-05 ENCOUNTER — Other Ambulatory Visit: Payer: Self-pay

## 2020-08-05 ENCOUNTER — Encounter: Payer: Self-pay | Admitting: Internal Medicine

## 2020-08-05 VITALS — BP 122/86 | HR 40 | Ht 62.0 in | Wt 179.2 lb

## 2020-08-05 DIAGNOSIS — I471 Supraventricular tachycardia: Secondary | ICD-10-CM | POA: Diagnosis not present

## 2020-08-05 MED ORDER — METOPROLOL SUCCINATE ER 25 MG PO TB24
25.0000 mg | ORAL_TABLET | Freq: Two times a day (BID) | ORAL | 3 refills | Status: DC
Start: 2020-08-05 — End: 2020-09-24

## 2020-08-05 NOTE — Progress Notes (Signed)
    HPI Tonya Gardner returns today after a long absence from our EP clinic. She is a pleasant 49 yo woman with a h/o repaired Tetrology in 1975, who developed profound bradycardia after giving birth to her daughter almost 20 years ago. She underwent PPM insertion and then developed a seizure and was diagnosed with eclampsia. She rarely used her PPM. She did not have it replaced when it reached ERI 10 years ago. She developed a RBBB tachycardia at 231/min on October and the tachycardia was treated with IV adenosine with no relief and stopped spontaneously. Left and right heart cath demonstrated no shunt, mod LV dysfunction and normal filling pressures and CO.  She had a recurrent bout of tachycardia a week ago and had to be cardioverted after it again did not stop with IV adenosine. She has been found to have both RV and LV dysfunction on her echo. She has no CHF symptoms.  Allergies  Allergen Reactions  . Sulfonamide Derivatives Hives  . Latex Rash     Current Outpatient Medications  Medication Sig Dispense Refill  . cetirizine (ZYRTEC) 10 MG tablet Take 10 mg by mouth daily. For allergies    . ibuprofen (ADVIL) 200 MG tablet Take 400 mg by mouth every 8 (eight) hours as needed (pain.).    . metoprolol succinate (TOPROL XL) 25 MG 24 hr tablet Take 1 tablet (25 mg total) by mouth in the morning and at bedtime. 180 tablet 3  . ondansetron (ZOFRAN ODT) 4 MG disintegrating tablet 4mg ODT q4 hours prn nausea/vomit 15 tablet 0  . pantoprazole (PROTONIX) 40 MG tablet Take 1 tablet (40 mg total) by mouth daily. 30 tablet 2   No current facility-administered medications for this visit.     Past Medical History:  Diagnosis Date  . CHF (congestive heart failure) (HCC) 2002   with pregnancy  . Headache   . Presence of permanent cardiac pacemaker    off for 2 years  . Seizures (HCC) 2002   due to eclampsia  . Sinus bradycardia   . Tetralogy of Fallot s/p repair 1975    ROS:   All systems  reviewed and negative except as noted in the HPI.   Past Surgical History:  Procedure Laterality Date  . ABDOMINAL HYSTERECTOMY N/A 08/12/2014   Procedure: HYSTERECTOMY ABDOMINAL;  Surgeon: Michelle L Grewal, MD;  Location: [facility];  Service: Gynecology;  Laterality: N/A;  . CESAREAN SECTION    . LAPAROSCOPIC ASSISTED VAGINAL HYSTERECTOMY N/A 08/12/2014   Procedure: ATTEMPTED LAPAROSCOPIC ASSISTED VAGINAL HYSTERECTOMY;  Surgeon: Michelle L Grewal, MD;  Location: [facility];  Service: Gynecology;  Laterality: N/A;  . PACEMAKER INSERTION     [employer]   . RIGHT/LEFT HEART CATH AND CORONARY ANGIOGRAPHY N/A 07/01/2020   Procedure: RIGHT/LEFT HEART CATH AND CORONARY ANGIOGRAPHY;  Surgeon: Varanasi, Jayadeep S, MD;  Location: [facility];  Service: Cardiovascular;  Laterality: N/A;  . SALPINGOOPHORECTOMY Bilateral 08/12/2014   Procedure: SALPINGO OOPHORECTOMY;  Surgeon: Michelle L Grewal, MD;  Location: [facility];  Service: Gynecology;  Laterality: Bilateral;  . TETRALOGY OF FALLOT REPAIR    . TONSILLECTOMY       History reviewed. No pertinent family history.   Social History   Socioeconomic History  . Marital status: Married    Spouse name: Not on file  . Number of children: Not on file  . Years of education: Not on file  . Highest education level: Not on file  Occupational History  .   Not on file  Tobacco Use  . Smoking status: Former Smoker  . Smokeless tobacco: Never Used  Vaping Use  . Vaping Use: Never used  Substance and Sexual Activity  . Alcohol use: Yes    Comment: occasionally  . Drug use: No  . Sexual activity: Yes    Birth control/protection: None  Other Topics Concern  . Not on file  Social History Narrative  . Not on file   Social Determinants of Health   Financial Resource Strain: Not on file  Food Insecurity: Not on file  Transportation Needs: Not on file  Physical Activity: Not on file  Stress: Not on file  Social Connections: Not on file  Intimate  Partner Violence: Not on file     BP 122/86   Pulse (!) 40   Ht 5' 2" (1.575 m)   Wt 179 lb 3.2 oz (81.3 kg)   LMP 07/23/2014 (Approximate)   SpO2 98%   BMI 32.78 kg/m   Physical Exam:  Well appearing NAD HEENT: Unremarkable Neck:  No JVD, no thyromegally Lymphatics:  No adenopathy Back:  No CVA tenderness Lungs:  Clear with no wheezes HEART:  Regular rate rhythm, 1/6 systolic murmurs, no rubs, no clicks Abd:  soft, positive bowel sounds, no organomegally, no rebound, no guarding Ext:  2 plus pulses, no edema, no cyanosis, no clubbing Skin:  No rashes no nodules Neuro:  CN II through XII intact, motor grossly intact  EKG Marked sinus bradycardia with RBBB  Assess/Plan: 1. Wide QRS tachy - I suspect that she has had atrial flutter with 1:1 AV conduction. She could have VT though less likely. I have recommended EP study and catheter ablation. The risks/benefits/goals/expectations of the procedure were reviewed and she wishes to proceed. 2. Sinus node dysfunction - hopefully her HR will improve after ablation and after stopping the toprol. She has an indwelling PM and this could be used in the future, after a gen change out. 3. PPM - her device has been abandoned for almost 10 years. We discussed extraction which would be a consideration if she can be ablated and her beta blocker stopped.  4. Non-ischemic CM - I would anticipate adjusting her meds after her procedure and perhaps using coreg instead of toprol, and add an ACE/ARB  Mykah Shin,MD 

## 2020-08-05 NOTE — H&P (View-Only) (Signed)
HPI Tonya Gardner returns today after a long absence from our EP clinic. She is a pleasant 49 yo woman with a h/o repaired Tetrology in 1975, who developed profound bradycardia after giving birth to her daughter almost 20 years ago. She underwent PPM insertion and then developed a seizure and was diagnosed with eclampsia. She rarely used her PPM. She did not have it replaced when it reached ERI 10 years ago. She developed a RBBB tachycardia at 231/min on October and the tachycardia was treated with IV adenosine with no relief and stopped spontaneously. Left and right heart cath demonstrated no shunt, mod LV dysfunction and normal filling pressures and CO.  She had a recurrent bout of tachycardia a week ago and had to be cardioverted after it again did not stop with IV adenosine. She has been found to have both RV and LV dysfunction on her echo. She has no CHF symptoms.  Allergies  Allergen Reactions  . Sulfonamide Derivatives Hives  . Latex Rash     Current Outpatient Medications  Medication Sig Dispense Refill  . cetirizine (ZYRTEC) 10 MG tablet Take 10 mg by mouth daily. For allergies    . ibuprofen (ADVIL) 200 MG tablet Take 400 mg by mouth every 8 (eight) hours as needed (pain.).    Marland Kitchen metoprolol succinate (TOPROL XL) 25 MG 24 hr tablet Take 1 tablet (25 mg total) by mouth in the morning and at bedtime. 180 tablet 3  . ondansetron (ZOFRAN ODT) 4 MG disintegrating tablet 4mg  ODT q4 hours prn nausea/vomit 15 tablet 0  . pantoprazole (PROTONIX) 40 MG tablet Take 1 tablet (40 mg total) by mouth daily. 30 tablet 2   No current facility-administered medications for this visit.     Past Medical History:  Diagnosis Date  . CHF (congestive heart failure) (HCC) 2002   with pregnancy  . Headache   . Presence of permanent cardiac pacemaker    off for 2 years  . Seizures (HCC) 2002   due to eclampsia  . Sinus bradycardia   . Tetralogy of Fallot s/p repair 1975    ROS:   All systems  reviewed and negative except as noted in the HPI.   Past Surgical History:  Procedure Laterality Date  . ABDOMINAL HYSTERECTOMY N/A 08/12/2014   Procedure: HYSTERECTOMY ABDOMINAL;  Surgeon: 08/14/2014, MD;  Location: [facility];  Service: Gynecology;  Laterality: N/A;  . CESAREAN SECTION    . LAPAROSCOPIC ASSISTED VAGINAL HYSTERECTOMY N/A 08/12/2014   Procedure: ATTEMPTED LAPAROSCOPIC ASSISTED VAGINAL HYSTERECTOMY;  Surgeon: 08/14/2014, MD;  Location: [facility];  Service: Gynecology;  Laterality: N/A;  . PACEMAKER INSERTION     [employer]   . RIGHT/LEFT HEART CATH AND CORONARY ANGIOGRAPHY N/A 07/01/2020   Procedure: RIGHT/LEFT HEART CATH AND CORONARY ANGIOGRAPHY;  Surgeon: 13/04/2020, MD;  Location: [facility];  Service: Cardiovascular;  Laterality: N/A;  . SALPINGOOPHORECTOMY Bilateral 08/12/2014   Procedure: SALPINGO OOPHORECTOMY;  Surgeon: 08/14/2014, MD;  Location: [facility];  Service: Gynecology;  Laterality: Bilateral;  . TETRALOGY OF FALLOT REPAIR    . TONSILLECTOMY       History reviewed. No pertinent family history.   Social History   Socioeconomic History  . Marital status: Married    Spouse name: Not on file  . Number of children: Not on file  . Years of education: Not on file  . Highest education level: Not on file  Occupational History  .  Not on file  Tobacco Use  . Smoking status: Former Games developer  . Smokeless tobacco: Never Used  Vaping Use  . Vaping Use: Never used  Substance and Sexual Activity  . Alcohol use: Yes    Comment: occasionally  . Drug use: No  . Sexual activity: Yes    Birth control/protection: None  Other Topics Concern  . Not on file  Social History Narrative  . Not on file   Social Determinants of Health   Financial Resource Strain: Not on file  Food Insecurity: Not on file  Transportation Needs: Not on file  Physical Activity: Not on file  Stress: Not on file  Social Connections: Not on file  Intimate  Partner Violence: Not on file     BP 122/86   Pulse (!) 40   Ht 5\' 2"  (1.575 m)   Wt 179 lb 3.2 oz (81.3 kg)   LMP 07/23/2014 (Approximate)   SpO2 98%   BMI 32.78 kg/m   Physical Exam:  Well appearing NAD HEENT: Unremarkable Neck:  No JVD, no thyromegally Lymphatics:  No adenopathy Back:  No CVA tenderness Lungs:  Clear with no wheezes HEART:  Regular rate rhythm, 1/6 systolic murmurs, no rubs, no clicks Abd:  soft, positive bowel sounds, no organomegally, no rebound, no guarding Ext:  2 plus pulses, no edema, no cyanosis, no clubbing Skin:  No rashes no nodules Neuro:  CN II through XII intact, motor grossly intact  EKG Marked sinus bradycardia with RBBB  Assess/Plan: 1. Wide QRS tachy - I suspect that she has had atrial flutter with 1:1 AV conduction. She could have VT though less likely. I have recommended EP study and catheter ablation. The risks/benefits/goals/expectations of the procedure were reviewed and she wishes to proceed. 2. Sinus node dysfunction - hopefully her HR will improve after ablation and after stopping the toprol. She has an indwelling PM and this could be used in the future, after a gen change out. 3. PPM - her device has been abandoned for almost 10 years. We discussed extraction which would be a consideration if she can be ablated and her beta blocker stopped.  4. Non-ischemic CM - I would anticipate adjusting her meds after her procedure and perhaps using coreg instead of toprol, and add an ACE/ARB  14/08/2013 Tonya Ruggiero,MD

## 2020-08-05 NOTE — Patient Instructions (Addendum)
Medication Instructions:  Your physician recommends that you continue on your current medications as directed. Please refer to the Current Medication list given to you today.  Labwork: None ordered.  Testing/Procedures: None ordered.  Follow-Up:  SEE INSTRUCTION LETTER  Any Other Special Instructions Will Be Listed Below (If Applicable).  If you need a refill on your cardiac medications before your next appointment, please call your pharmacy.    Cardiac Ablation Cardiac ablation is a procedure to disable (ablate) a small amount of heart tissue in very specific places. The heart has many electrical connections. Sometimes these connections are abnormal and can cause the heart to beat very fast or irregularly. Ablating some of the problem areas can improve the heart rhythm or return it to normal. Ablation may be done for people who:  Have Wolff-Parkinson-White syndrome.  Have fast heart rhythms (tachycardia).  Have taken medicines for an abnormal heart rhythm (arrhythmia) that were not effective or caused side effects.  Have a high-risk heartbeat that may be life-threatening. During the procedure, a small incision is made in the neck or the groin, and a long, thin, flexible tube (catheter) is inserted into the incision and moved to the heart. Small devices (electrodes) on the tip of the catheter will send out electrical currents. A type of X-ray (fluoroscopy) will be used to help guide the catheter and to provide images of the heart. Tell a health care provider about:  Any allergies you have.  All medicines you are taking, including vitamins, herbs, eye drops, creams, and over-the-counter medicines.  Any problems you or family members have had with anesthetic medicines.  Any blood disorders you have.  Any surgeries you have had.  Any medical conditions you have, such as kidney failure.  Whether you are pregnant or may be pregnant. What are the risks? Generally, this is a  safe procedure. However, problems may occur, including:  Infection.  Bruising and bleeding at the catheter insertion site.  Bleeding into the chest, especially into the sac that surrounds the heart. This is a serious complication.  Stroke or blood clots.  Damage to other structures or organs.  Allergic reaction to medicines or dyes.  Need for a permanent pacemaker if the normal electrical system is damaged. A pacemaker is a small computer that sends electrical signals to the heart and helps your heart beat normally.  The procedure not being fully effective. This may not be recognized until months later. Repeat ablation procedures are sometimes required. What happens before the procedure?  Follow instructions from your health care provider about eating or drinking restrictions.  Ask your health care provider about: ? Changing or stopping your regular medicines. This is especially important if you are taking diabetes medicines or blood thinners. ? Taking medicines such as aspirin and ibuprofen. These medicines can thin your blood. Do not take these medicines before your procedure if your health care provider instructs you not to.  Plan to have someone take you home from the hospital or clinic.  If you will be going home right after the procedure, plan to have someone with you for 24 hours. What happens during the procedure?  To lower your risk of infection: ? Your health care team will wash or sanitize their hands. ? Your skin will be washed with soap. ? Hair may be removed from the incision area.  An IV tube will be inserted into one of your veins.  You will be given a medicine to help you relax (sedative).  The   skin on your neck or groin will be numbed.  An incision will be made in your neck or your groin.  A needle will be inserted through the incision and into a large vein in your neck or groin.  A catheter will be inserted into the needle and moved to your  heart.  Dye may be injected through the catheter to help your surgeon see the area of the heart that needs treatment.  Electrical currents will be sent from the catheter to ablate heart tissue in desired areas. There are three types of energy that may be used to ablate heart tissue: ? Heat (radiofrequency energy). ? Laser energy. ? Extreme cold (cryoablation).  When the necessary tissue has been ablated, the catheter will be removed.  Pressure will be held on the catheter insertion area to prevent excessive bleeding.  A bandage (dressing) will be placed over the catheter insertion area. The procedure may vary among health care providers and hospitals. What happens after the procedure?  Your blood pressure, heart rate, breathing rate, and blood oxygen level will be monitored until the medicines you were given have worn off.  Your catheter insertion area will be monitored for bleeding. You will need to lie still for a few hours to ensure that you do not bleed from the catheter insertion area.  Do not drive for 24 hours or as long as directed by your health care provider. Summary  Cardiac ablation is a procedure to disable (ablate) a small amount of heart tissue in very specific places. Ablating some of the problem areas can improve the heart rhythm or return it to normal.  During the procedure, electrical currents will be sent from the catheter to ablate heart tissue in desired areas. This information is not intended to replace advice given to you by your health care provider. Make sure you discuss any questions you have with your health care provider. Document Revised: 01/30/2018 Document Reviewed: 06/28/2016 Elsevier Patient Education  2020 [employer]  

## 2020-08-19 ENCOUNTER — Other Ambulatory Visit (HOSPITAL_COMMUNITY)
Admission: RE | Admit: 2020-08-19 | Discharge: 2020-08-19 | Disposition: A | Discharge status: Home / Self Care | Payer: BC Managed Care – PPO | Source: Ambulatory Visit | Attending: Internal Medicine | Admitting: Internal Medicine

## 2020-08-19 DIAGNOSIS — I4510 Unspecified right bundle-branch block: Secondary | ICD-10-CM | POA: Diagnosis not present

## 2020-08-19 DIAGNOSIS — Z90710 Acquired absence of both cervix and uterus: Secondary | ICD-10-CM | POA: Diagnosis not present

## 2020-08-19 DIAGNOSIS — Z90721 Acquired absence of ovaries, unilateral: Secondary | ICD-10-CM | POA: Diagnosis not present

## 2020-08-19 DIAGNOSIS — Z882 Allergy status to sulfonamides status: Secondary | ICD-10-CM | POA: Diagnosis not present

## 2020-08-19 DIAGNOSIS — Z20822 Contact with and (suspected) exposure to covid-19: Secondary | ICD-10-CM | POA: Insufficient documentation

## 2020-08-19 DIAGNOSIS — Z91040 Latex allergy status: Secondary | ICD-10-CM | POA: Diagnosis not present

## 2020-08-19 DIAGNOSIS — Z01812 Encounter for preprocedural laboratory examination: Secondary | ICD-10-CM | POA: Insufficient documentation

## 2020-08-19 DIAGNOSIS — Z87891 Personal history of nicotine dependence: Secondary | ICD-10-CM | POA: Diagnosis not present

## 2020-08-19 DIAGNOSIS — I483 Typical atrial flutter: Secondary | ICD-10-CM | POA: Diagnosis not present

## 2020-08-19 DIAGNOSIS — Z8774 Personal history of (corrected) congenital malformations of heart and circulatory system: Secondary | ICD-10-CM | POA: Diagnosis not present

## 2020-08-19 DIAGNOSIS — I495 Sick sinus syndrome: Secondary | ICD-10-CM | POA: Diagnosis not present

## 2020-08-19 DIAGNOSIS — Z79899 Other long term (current) drug therapy: Secondary | ICD-10-CM | POA: Diagnosis not present

## 2020-08-19 LAB — SARS CORONAVIRUS 2 (TAT 6-24 HRS): SARS Coronavirus 2: NEGATIVE

## 2020-08-20 NOTE — Progress Notes (Signed)
Attempted to call patient regarding procedure instructions for tomorrow.  Left voicemail with instructions nothing to eat or drink after midnight, need responsible adult to drive you home and stay with you for 1st 24 hours.  No medications morning of procedure.

## 2020-08-21 ENCOUNTER — Other Ambulatory Visit: Payer: Self-pay

## 2020-08-21 ENCOUNTER — Ambulatory Visit (HOSPITAL_COMMUNITY)
Admission: RE | Disposition: A | Discharge status: Home / Self Care | Payer: Self-pay | Source: Home / Self Care | Attending: Internal Medicine

## 2020-08-21 ENCOUNTER — Ambulatory Visit (HOSPITAL_COMMUNITY)
Admission: RE | Admit: 2020-08-21 | Discharge: 2020-08-21 | Disposition: A | Discharge status: Home / Self Care | Payer: BC Managed Care – PPO | Attending: Internal Medicine | Admitting: Internal Medicine

## 2020-08-21 DIAGNOSIS — Z90721 Acquired absence of ovaries, unilateral: Secondary | ICD-10-CM | POA: Insufficient documentation

## 2020-08-21 DIAGNOSIS — Z8774 Personal history of (corrected) congenital malformations of heart and circulatory system: Secondary | ICD-10-CM | POA: Insufficient documentation

## 2020-08-21 DIAGNOSIS — I4892 Unspecified atrial flutter: Secondary | ICD-10-CM | POA: Diagnosis not present

## 2020-08-21 DIAGNOSIS — Z90710 Acquired absence of both cervix and uterus: Secondary | ICD-10-CM | POA: Insufficient documentation

## 2020-08-21 DIAGNOSIS — I483 Typical atrial flutter: Secondary | ICD-10-CM | POA: Insufficient documentation

## 2020-08-21 DIAGNOSIS — Z882 Allergy status to sulfonamides status: Secondary | ICD-10-CM | POA: Insufficient documentation

## 2020-08-21 DIAGNOSIS — I471 Supraventricular tachycardia: Secondary | ICD-10-CM | POA: Diagnosis not present

## 2020-08-21 DIAGNOSIS — I4510 Unspecified right bundle-branch block: Secondary | ICD-10-CM | POA: Insufficient documentation

## 2020-08-21 DIAGNOSIS — Z20822 Contact with and (suspected) exposure to covid-19: Secondary | ICD-10-CM | POA: Insufficient documentation

## 2020-08-21 DIAGNOSIS — Z91040 Latex allergy status: Secondary | ICD-10-CM | POA: Diagnosis not present

## 2020-08-21 DIAGNOSIS — I495 Sick sinus syndrome: Secondary | ICD-10-CM | POA: Insufficient documentation

## 2020-08-21 DIAGNOSIS — Z87891 Personal history of nicotine dependence: Secondary | ICD-10-CM | POA: Diagnosis not present

## 2020-08-21 DIAGNOSIS — Z79899 Other long term (current) drug therapy: Secondary | ICD-10-CM | POA: Insufficient documentation

## 2020-08-21 HISTORY — PX: SVT ABLATION: EP1225

## 2020-08-21 SURGERY — SVT ABLATION

## 2020-08-21 MED ORDER — LIDOCAINE HCL (PF) 1 % IJ SOLN
INTRAMUSCULAR | Status: DC | PRN
  Administered 2020-08-21 (×2): 30 mL

## 2020-08-21 MED ORDER — ONDANSETRON HCL 4 MG/2ML IJ SOLN
INTRAMUSCULAR | Status: AC
  Filled 2020-08-21: qty 2

## 2020-08-21 MED ORDER — HEPARIN (PORCINE) IN NACL 1000-0.9 UT/500ML-% IV SOLN
INTRAVENOUS | Status: AC
  Filled 2020-08-21: qty 500

## 2020-08-21 MED ORDER — FENTANYL CITRATE (PF) 100 MCG/2ML IJ SOLN
INTRAMUSCULAR | Status: AC
  Filled 2020-08-21: qty 2

## 2020-08-21 MED ORDER — ONDANSETRON HCL 4 MG/2ML IJ SOLN: 4.0000 mg | Freq: Four times a day (QID) | INTRAMUSCULAR | Status: DC | PRN

## 2020-08-21 MED ORDER — MIDAZOLAM HCL 5 MG/5ML IJ SOLN
INTRAMUSCULAR | Status: AC
  Filled 2020-08-21: qty 5

## 2020-08-21 MED ORDER — ISOPROTERENOL HCL 0.2 MG/ML IJ SOLN
INTRAMUSCULAR | Status: AC
  Filled 2020-08-21: qty 5

## 2020-08-21 MED ORDER — BUPIVACAINE HCL (PF) 0.25 % IJ SOLN
INTRAMUSCULAR | Status: AC
  Filled 2020-08-21: qty 30

## 2020-08-21 MED ORDER — HEPARIN (PORCINE) IN NACL 1000-0.9 UT/500ML-% IV SOLN
INTRAVENOUS | Status: DC | PRN
  Administered 2020-08-21: 500 mL

## 2020-08-21 MED ORDER — SODIUM CHLORIDE 0.9 % IV SOLN: 250.0000 mL | INTRAVENOUS | Status: DC | PRN

## 2020-08-21 MED ORDER — SODIUM CHLORIDE 0.9% FLUSH: 3.0000 mL | INTRAVENOUS | Status: DC | PRN

## 2020-08-21 MED ORDER — SODIUM CHLORIDE 0.9 % IV SOLN: INTRAVENOUS | Status: DC

## 2020-08-21 MED ORDER — SODIUM CHLORIDE 0.9% FLUSH: 3.0000 mL | Freq: Two times a day (BID) | INTRAVENOUS | Status: DC

## 2020-08-21 MED ORDER — FENTANYL CITRATE (PF) 100 MCG/2ML IJ SOLN
INTRAMUSCULAR | Status: DC | PRN
  Administered 2020-08-21 (×2): 12.5 ug via INTRAVENOUS
  Administered 2020-08-21 (×5): 25 ug via INTRAVENOUS
  Administered 2020-08-21: 12.5 ug via INTRAVENOUS
  Administered 2020-08-21: 25 ug via INTRAVENOUS
  Administered 2020-08-21: 12.5 ug via INTRAVENOUS
  Administered 2020-08-21: 25 ug via INTRAVENOUS
  Administered 2020-08-21 (×2): 12.5 ug via INTRAVENOUS
  Administered 2020-08-21: 25 ug via INTRAVENOUS

## 2020-08-21 MED ORDER — ISOPROTERENOL HCL 0.2 MG/ML IJ SOLN
INTRAVENOUS | Status: DC | PRN
  Administered 2020-08-21: 09:00:00 4 ug/min via INTRAVENOUS

## 2020-08-21 MED ORDER — ACETAMINOPHEN 325 MG PO TABS: 650.0000 mg | ORAL_TABLET | ORAL | Status: DC | PRN

## 2020-08-21 MED ORDER — ONDANSETRON HCL 4 MG/2ML IJ SOLN
INTRAMUSCULAR | Status: DC | PRN
  Administered 2020-08-21: 4 mg via INTRAVENOUS

## 2020-08-21 MED ORDER — MIDAZOLAM HCL 5 MG/5ML IJ SOLN
INTRAMUSCULAR | Status: DC | PRN
  Administered 2020-08-21 (×3): 1 mg via INTRAVENOUS
  Administered 2020-08-21: 2 mg via INTRAVENOUS
  Administered 2020-08-21 (×4): 1 mg via INTRAVENOUS
  Administered 2020-08-21: 2 mg via INTRAVENOUS
  Administered 2020-08-21 (×4): 1 mg via INTRAVENOUS
  Administered 2020-08-21: 2 mg via INTRAVENOUS

## 2020-08-21 SURGICAL SUPPLY — 15 items
BAG SNAP BAND KOVER 36X36 (MISCELLANEOUS) ×2 IMPLANT
CATH EZ STEER NAV 4MM D-F CUR (ABLATOR) ×2 IMPLANT
CATH JOSEPH QUAD ALLRED 6F REP (CATHETERS) ×4 IMPLANT
CATH POLARIS X 2.5/5/2.5 DECAP (CATHETERS) ×2 IMPLANT
COVER DOME SNAP 22 D (MISCELLANEOUS) ×2 IMPLANT
MAT PREVALON FULL STRYKER (MISCELLANEOUS) ×2 IMPLANT
PACK EP LATEX FREE (CUSTOM PROCEDURE TRAY) ×2
PACK EP LF (CUSTOM PROCEDURE TRAY) ×1 IMPLANT
PAD PRO RADIOLUCENT 2001M-C (PAD) ×2 IMPLANT
PATCH CARTO3 (PAD) ×2 IMPLANT
SHEATH PINNACLE 6F 10CM (SHEATH) ×4 IMPLANT
SHEATH PINNACLE 7F 10CM (SHEATH) ×2 IMPLANT
SHEATH PINNACLE 8F 10CM (SHEATH) ×2 IMPLANT
SHEATH PINNACLE 9F 10CM (SHEATH) ×2 IMPLANT
SHEATH SWARTZ RAMP 8.5F 60CM (SHEATH) ×2 IMPLANT

## 2020-08-21 NOTE — Progress Notes (Signed)
Site area: rt ij venous sheath Site Prior to Removal:  Level 0 Pressure Applied For: 10 minutes Manual:   yes Patient Status During Pull:  stable Post Pull Site:  Level 0 Post Pull Instructions Given:  yes Post Pull Pulses Present: na Dressing Applied:  Petroleum gauze, gauze and tegaderm Bedrest begins @  Comments:

## 2020-08-21 NOTE — Progress Notes (Signed)
Pt ambulated without difficulty or bleeding.   Discharged home with husband who will drive and stay with pt x 24 hrs  

## 2020-08-21 NOTE — Progress Notes (Signed)
Site area: 3 rt fv sheaths Site Prior to Removal:  Level 0 Pressure Applied For: 15 minutes Manual:   yes Patient Status During Pull:  stable Post Pull Site:  Level 0 Post Pull Instructions Given:  yes Post Pull Pulses Present: rt dp palpable Dressing Applied:  Gauze and tegaderm Bedrest begins @ 1120 Comments: IV saline locked

## 2020-08-21 NOTE — Discharge Instructions (Signed)
Cardiac Ablation, Care After  This sheet gives you information about how to care for yourself after your procedure. Your health care provider may also give you more specific instructions. If you have problems or questions, contact your health care provider. What can I expect after the procedure? After the procedure, it is common to have:  Bruising around your puncture site.  Tenderness around your puncture site.  Skipped heartbeats.  Tiredness (fatigue).  Follow these instructions at home: Puncture site care   Follow instructions from your health care provider about how to take care of your puncture site. Make sure you: ? If present, leave stitches (sutures), skin glue, or adhesive strips in place. These skin closures may need to stay in place for up to 2 weeks. If adhesive strip edges start to loosen and curl up, you may trim the loose edges. Do not remove adhesive strips completely unless your health care provider tells you to do that. ? If a large square bandage is present, this may be removed in 24 hours.   Check your puncture site every day for signs of infection. Check for: ? Redness, swelling, or pain. ? Fluid or blood. If your puncture site starts to bleed, lie down on your back, apply firm pressure to the area, and contact your health care provider. ? Warmth. ? Pus or a bad smell. Driving  Do not drive for at least 4 days after your procedure or however long your health care provider recommends. (Do not resume driving if you have previously been instructed not to drive for other health reasons.)  Do not drive or use heavy machinery while taking prescription pain medicine. Activity  Avoid activities that take a lot of effort for at least 7 days after your procedure.  Do not lift anything that is heavier than 5 lb (4.5 kg) for one week.   No sexual activity for 1 week.   Return to your normal activities as told by your health care provider. Ask your health care provider  what activities are safe for you. General instructions  Take over-the-counter and prescription medicines only as told by your health care provider.  Do not use any products that contain nicotine or tobacco, such as cigarettes and e-cigarettes. If you need help quitting, ask your health care provider.  You may shower after 24 hours, but Do not take baths, swim, or use a hot tub for 1 week.   Do not drink alcohol for 24 hours after your procedure.  Keep all follow-up visits as told by your health care provider. This is important. Contact a health care provider if:  You have redness, mild swelling, or pain around your puncture site.  You have fluid or blood coming from your puncture site that stops after applying firm pressure to the area.  Your puncture site feels warm to the touch.  You have pus or a bad smell coming from your puncture site.  You have a fever.  You have chest pain or discomfort that spreads to your neck, jaw, or arm.  You are sweating a lot.  You feel nauseous.  You have a fast or irregular heartbeat.  You have shortness of breath.  You are dizzy or light-headed and feel the need to lie down.  You have pain or numbness in the arm or leg closest to your puncture site. Get help right away if:  Your puncture site suddenly swells.  Your puncture site is bleeding and the bleeding does not stop after applying  firm pressure to the area. These symptoms may represent a serious problem that is an emergency. Do not wait to see if the symptoms will go away. Get medical help right away. Call your local emergency services (911 in the [place] Do not drive yourself to the hospital. Summary  After the procedure, it is normal to have bruising and tenderness at the puncture site in your groin, neck, or forearm.  Check your puncture site every day for signs of infection.  Get help right away if your puncture site is bleeding and the bleeding does not stop after applying  firm pressure to the area. This is a medical emergency. This information is not intended to replace advice given to you by your health care provider. Make sure you discuss any questions you have with your health care provider.

## 2020-08-21 NOTE — Interval H&P Note (Signed)
History and Physical Interval Note:  08/21/2020 7:34 AM  Tonya Gardner  has presented today for surgery, with the diagnosis of svt.  The various methods of treatment have been discussed with the patient and family. After consideration of risks, benefits and other options for treatment, the patient has consented to  Procedure(s): SVT ABLATION (N/A) as a surgical intervention.  The patient's history has been reviewed, patient examined, no change in status, stable for surgery.  I have reviewed the patient's chart and labs.  Questions were answered to the patient's satisfaction.     Lewayne Bunting

## 2020-08-22 MED FILL — Bupivacaine HCl Preservative Free (PF) Inj 0.25%: INTRAMUSCULAR | Qty: 30 | Status: AC

## 2020-08-25 ENCOUNTER — Encounter (HOSPITAL_COMMUNITY): Payer: Self-pay | Admitting: Internal Medicine

## 2020-09-24 ENCOUNTER — Encounter: Payer: Self-pay | Admitting: Internal Medicine

## 2020-09-24 ENCOUNTER — Ambulatory Visit (INDEPENDENT_AMBULATORY_CARE_PROVIDER_SITE_OTHER): Payer: BC Managed Care – PPO | Admitting: Internal Medicine

## 2020-09-24 ENCOUNTER — Other Ambulatory Visit: Payer: Self-pay

## 2020-09-24 VITALS — BP 100/64 | HR 43 | Ht 62.0 in | Wt 177.0 lb

## 2020-09-24 DIAGNOSIS — I471 Supraventricular tachycardia: Secondary | ICD-10-CM

## 2020-09-24 MED ORDER — METOPROLOL SUCCINATE ER 25 MG PO TB24: 25.0000 mg | ORAL_TABLET | Freq: Every day | ORAL | 3 refills | Status: DC

## 2020-09-24 NOTE — Progress Notes (Signed)
HPI Tonya Gardner returns today for followup. She has a h/o remote TOF repair, ecclampsia diagnosed after a seizure and a PPM, RBBB tachycardia, s/p cather ablation of atrial tachy X2, and atrial flutter, with an abandoned PM system, now 50 years old. The patient has done well after catheter ablation. She has had rare palps, none of which have been sustained.  Allergies  Allergen Reactions  . Sulfonamide Derivatives Hives  . Latex Rash     Current Outpatient Medications  Medication Sig Dispense Refill  . cetirizine (ZYRTEC) 10 MG tablet Take 10 mg by mouth daily as needed for allergies.    Marland Kitchen ENTRESTO 24-26 MG Take 1 tablet by mouth 2 (two) times daily.    Marland Kitchen ibuprofen (ADVIL) 200 MG tablet Take 400 mg by mouth every 8 (eight) hours as needed for mild pain or moderate pain (pain.).    Marland Kitchen metoprolol succinate (TOPROL XL) 25 MG 24 hr tablet Take 1 tablet (25 mg total) by mouth in the morning and at bedtime. 180 tablet 3  . ondansetron (ZOFRAN ODT) 4 MG disintegrating tablet 4mg  ODT q4 hours prn nausea/vomit 15 tablet 0  . pantoprazole (PROTONIX) 40 MG tablet Take 1 tablet (40 mg total) by mouth daily. 30 tablet 2   No current facility-administered medications for this visit.     Past Medical History:  Diagnosis Date  . CHF (congestive heart failure) (HCC) 2002   with pregnancy  . Headache   . Presence of permanent cardiac pacemaker    off for 2 years  . Seizures (HCC) 2002   due to eclampsia  . Sinus bradycardia   . Tetralogy of Fallot s/p repair 1975    ROS:   All systems reviewed and negative except as noted in the HPI.   Past Surgical History:  Procedure Laterality Date  . ABDOMINAL HYSTERECTOMY N/A 08/12/2014   Procedure: HYSTERECTOMY ABDOMINAL;  Surgeon: 08/14/2014, MD;  Location: [facility];  Service: Gynecology;  Laterality: N/A;  . CESAREAN SECTION    . LAPAROSCOPIC ASSISTED VAGINAL HYSTERECTOMY N/A 08/12/2014   Procedure: ATTEMPTED LAPAROSCOPIC ASSISTED  VAGINAL HYSTERECTOMY;  Surgeon: 08/14/2014, MD;  Location: [facility];  Service: Gynecology;  Laterality: N/A;  . PACEMAKER INSERTION     [employer]   . RIGHT/LEFT HEART CATH AND CORONARY ANGIOGRAPHY N/A 07/01/2020   Procedure: RIGHT/LEFT HEART CATH AND CORONARY ANGIOGRAPHY;  Surgeon: 13/04/2020, MD;  Location: [facility] INVASIVE CV LAB;  Service: Cardiovascular;  Laterality: N/A;  . SALPINGOOPHORECTOMY Bilateral 08/12/2014   Procedure: SALPINGO OOPHORECTOMY;  Surgeon: 08/14/2014, MD;  Location: [facility];  Service: Gynecology;  Laterality: Bilateral;  . SVT ABLATION N/A 08/21/2020   Procedure: SVT ABLATION;  Surgeon: 08/23/2020, MD;  Location: [facility] INVASIVE CV LAB;  Service: Cardiovascular;  Laterality: N/A;  . TETRALOGY OF FALLOT REPAIR    . TONSILLECTOMY       No family history on file.   Social History   Socioeconomic History  . Marital status: Married    Spouse name: Not on file  . Number of children: Not on file  . Years of education: Not on file  . Highest education level: Not on file  Occupational History  . Not on file  Tobacco Use  . Smoking status: Former [facility]  . Smokeless tobacco: Never Used  Vaping Use  . Vaping Use: Never used  Substance and Sexual Activity  . Alcohol use: Yes    Comment: occasionally  .  Drug use: No  . Sexual activity: Yes    Birth control/protection: None  Other Topics Concern  . Not on file  Social History Narrative  . Not on file   Social Determinants of Health   Financial Resource Strain: Not on file  Food Insecurity: Not on file  Transportation Needs: Not on file  Physical Activity: Not on file  Stress: Not on file  Social Connections: Not on file  Intimate Partner Violence: Not on file     BP 100/64   Pulse (!) 43   Ht 5\' 2"  (1.575 m)   Wt 177 lb (80.3 kg)   LMP 07/23/2014 (Approximate)   SpO2 99%   BMI 32.37 kg/m   Physical Exam:  Well appearing overweight 50 yo woman, NAD HEENT: Unremarkable Neck:   6 cm JVD, no thyromegally Lymphatics:  No adenopathy Back:  No CVA tenderness Lungs:  Clear with no wheezes HEART:  Regular brady rhythm, no murmurs, no rubs, no clicks Abd:  soft, positive bowel sounds, no organomegally, no rebound, no guarding Ext:  2 plus pulses, no edema, no cyanosis, no clubbing Skin:  No rashes no nodules Neuro:  CN II through XII intact, motor grossly intact  EKG - sinus brady with RBBB   Assess/Plan: 1. SVT - she is s/p EPS/RFA of atrial tachycardia and atrial flutter. I asked her to reduce her dose of toprol. 2. Sinus node dysfunction - she will reduce her dose of toprol to 25 mg daily. 3. PPM - her device was placed 20 years ago and has been ERI for 10 years. We discussed PM system extraction. I will see her back in 3-4 months. If her HR has improved, then we will consider removing her system. If her HR remains low, I would be inclined to leave it in place in case she needed a PPM in the future. 4. Obesity - she is encouraged to lose weight. She has lost 4 lbs.  5. Covid - she has recovered from Covid which she developed about 2 weeks ago.  [name],MD

## 2020-09-24 NOTE — Patient Instructions (Addendum)
Medication Instructions:  Your physician has recommended you make the following change in your medication:  1.  REDUCE your Toprol XL 25 mg- Take one tablet by mouth daily.  Labwork: None ordered.  Testing/Procedures: None ordered.  Follow-Up: Your physician wants you to follow-up in: 4 months with Dr. Ladona Ridgel.     January 27, 2021 at 9:45 am at the [facility] office  Any Other Special Instructions Will Be Listed Below (If Applicable).  If you need a refill on your cardiac medications before your next appointment, please call your pharmacy.

## 2020-09-26 ENCOUNTER — Other Ambulatory Visit: Payer: Self-pay | Admitting: Internal Medicine

## 2020-10-03 ENCOUNTER — Other Ambulatory Visit: Payer: Self-pay

## 2020-10-03 ENCOUNTER — Ambulatory Visit (HOSPITAL_COMMUNITY): Payer: BC Managed Care – PPO | Attending: Cardiology

## 2020-10-03 DIAGNOSIS — R000 Tachycardia, unspecified: Secondary | ICD-10-CM | POA: Diagnosis not present

## 2020-10-03 DIAGNOSIS — R002 Palpitations: Secondary | ICD-10-CM | POA: Diagnosis not present

## 2020-10-03 MED ORDER — PERFLUTREN LIPID MICROSPHERE
1.0000 mL | INTRAVENOUS | Status: AC | PRN
  Administered 2020-10-03: 2 mL via INTRAVENOUS

## 2020-10-04 LAB — ECHOCARDIOGRAM COMPLETE
AV Mean grad: 6.7 mmHg
AV Peak grad: 12 mmHg
Ao pk vel: 1.73 m/s
Area-P 1/2: 3.08 cm2
S' Lateral: 2.9 cm

## 2020-10-23 ENCOUNTER — Ambulatory Visit (INDEPENDENT_AMBULATORY_CARE_PROVIDER_SITE_OTHER): Payer: BC Managed Care – PPO | Admitting: Internal Medicine

## 2020-10-23 ENCOUNTER — Encounter: Payer: Self-pay | Admitting: Internal Medicine

## 2020-10-23 ENCOUNTER — Other Ambulatory Visit: Payer: Self-pay

## 2020-10-23 VITALS — BP 98/62 | HR 50 | Ht 62.0 in | Wt 180.6 lb

## 2020-10-23 DIAGNOSIS — I471 Supraventricular tachycardia: Secondary | ICD-10-CM

## 2020-10-23 NOTE — Patient Instructions (Signed)
Medication Instructions:  Per Dr. Tenny Craw - Hold Entresto for a day and see if you notice any difference.  If you do, call Dr. Tenny Craw, if you do not note any difference, go right back on the medicine. *If you need a refill on your cardiac medications before your next appointment, please call your pharmacy*   Lab Work: none If you have labs (blood work) drawn today and your tests are completely normal, you will receive your results only by: Marland Kitchen MyChart Message (if you have MyChart) OR . A paper copy in the mail If you have any lab test that is abnormal or we need to change your treatment, we will call you to review the results.   Testing/Procedures: none   Follow-Up: At [facility], you and your health needs are our priority.  As part of our continuing mission to provide you with exceptional heart care, we have created designated Provider Care Teams.  These Care Teams include your primary Cardiologist (physician) and Advanced Practice Providers (APPs -  Physician Assistants and Nurse Practitioners) who all work together to provide you with the care you need, when you need it.  Your next appointment:   7 month(s)  The format for your next appointment:   In Person  Provider:   You may see Dietrich Pates, MD or one of the following Advanced Practice Providers on your designated Care Team:    Tereso Newcomer, PA-C  Chelsea Aus, [place]   Other Instructions

## 2020-10-23 NOTE — Progress Notes (Signed)
Cardiology Office Note   Date:  10/23/2020   ID:  Tonya Gardner, DOB September 23, 1970, MRN [id number]  PCP:  Pcp, No  Cardiologist:   Dietrich Pates, MD   Pt presents for f/u of CHF   History of Present Illness: Tonya Gardner is a 50 y.o. female with a history of Tetralogy f Fallot (s/p repair in 1975), bradycardia (symptomatic in 2002; battery at end of life 2012).   She was  Admitted in fall  to [facility] for dizziess, SOB, diaphoresis.   EKG showed SVT   Slowed with adenosine then converted to SR I saw her in consult when she was admitted to [facility]   She said prior to the admission she had been doing OK   Had just gotten back from [place]  Did fine with walking   No SOB   She denied palpitatoins prior   Echo was done on 10/11 and 10/12  SHowed moderate LV and RV dysfunction.   No echo prior     The pt was sent home with plan for f/u in clinic and  echo in a few months   She was sent home on Toprol The pt continued to feel badly, SOB   Set up for R and L heart catheterization   This showed normal Pressures, normal coronary arteries     Since then she has been placed on Entresto  She had recurrent tachycardia and was seen by EP  Went on to have ablation of atrial tachy x 2 and atrial flutter.  Since the procedure she has improved.   Seen by Rosette Reveal in early February  She says her breathing overall is good   Still feels fatigued at times but much better than she did before the ablation      Current Meds  Medication Sig  . cetirizine (ZYRTEC) 10 MG tablet Take 10 mg by mouth daily as needed for allergies.  Marland Kitchen ENTRESTO 24-26 MG Take 1 tablet by mouth 2 (two) times daily.  Marland Kitchen ibuprofen (ADVIL) 200 MG tablet Take 400 mg by mouth every 8 (eight) hours as needed for mild pain or moderate pain (pain.).  Marland Kitchen metoprolol succinate (TOPROL XL) 25 MG 24 hr tablet Take 1 tablet (25 mg total) by mouth daily. (Patient taking differently: Take 25 mg by mouth daily. 1 tablet in the morning and 1/2 tablet in the  evening)  . ondansetron (ZOFRAN ODT) 4 MG disintegrating tablet 4mg  ODT q4 hours prn nausea/vomit  . pantoprazole (PROTONIX) 40 MG tablet TAKE 1 TABLET(40 MG) BY MOUTH DAILY     Allergies:   Sulfonamide derivatives and Latex   Past Medical History:  Diagnosis Date  . CHF (congestive heart failure) (HCC) 2002   with pregnancy  . Headache   . Presence of permanent cardiac pacemaker    off for 2 years  . Seizures (HCC) 2002   due to eclampsia  . Sinus bradycardia   . Tetralogy of Fallot s/p repair 1975    Past Surgical History:  Procedure Laterality Date  . ABDOMINAL HYSTERECTOMY N/A 08/12/2014   Procedure: HYSTERECTOMY ABDOMINAL;  Surgeon: 08/14/2014, MD;  Location: [facility];  Service: Gynecology;  Laterality: N/A;  . CESAREAN SECTION    . LAPAROSCOPIC ASSISTED VAGINAL HYSTERECTOMY N/A 08/12/2014   Procedure: ATTEMPTED LAPAROSCOPIC ASSISTED VAGINAL HYSTERECTOMY;  Surgeon: 08/14/2014, MD;  Location: [facility];  Service: Gynecology;  Laterality: N/A;  . PACEMAKER INSERTION     [employer]tronic   .  RIGHT/LEFT HEART CATH AND CORONARY ANGIOGRAPHY N/A 07/01/2020   Procedure: RIGHT/LEFT HEART CATH AND CORONARY ANGIOGRAPHY;  Surgeon: Corky Crafts, MD;  Location[facility] INVASIVE CV LAB;  Service: Cardiovascular;  Laterality: N/A;  . SALPINGOOPHORECTOMY Bilateral 08/12/2014   Procedure: SALPINGO OOPHORECTOMY;  Surgeon: Jeani Hawking, MD;  Location: [facility];  Service: Gynecology;  Laterality: Bilateral;  . SVT ABLATION N/A 08/21/2020   Procedure: SVT ABLATION;  Surgeon: Marinus Maw, MD;  Location: [facility] INVASIVE CV LAB;  Service: Cardiovascular;  Laterality: N/A;  . TETRALOGY OF FALLOT REPAIR    . TONSILLECTOMY       Social History:  The patient  reports that she has quit smoking. She has never used smokeless tobacco. She reports current alcohol use. She reports that she does not use drugs.   Family History:  The patient's family history is not on file.    ROS:  Please see  the history of present illness. All other systems are reviewed and  Negative to the above problem except as noted.    PHYSICAL EXAM: VS:  BP 98/62   Pulse (!) 50   Ht 5\' 2"  (1.575 m)   Wt 180 lb 9.6 oz (81.9 kg)   LMP 07/23/2014 (Approximate)   BMI 33.03 kg/m   GEN:  Obese 50 yo  in no acute distress  HEENT: normal  Neck: no JVD, carotid bruits Cardiac: RRR; no murmurs  No LE edema  Respiratory:  clear to auscultation bilaterally, GI: soft, nontender, nondistended, + BS  No hepatomegaly  MS: no deformity Moving all extremities   Skin: warm and dry, no rash Neuro:  Strength and sensation are intact Psych: euthymic mood, full affect   EKG:  EKG is   ordered today   SB 50 bpm  RBBB   Echo  10/03/20  Left ventricular ejection fraction, by estimation, is 45 to 50%. The left ventricle has mildly decreased function. The left ventricle demonstrates global hypokinesis. Left ventricular diastolic parameters are indeterminate. There is the interventricular septum is flattened in diastole ('D' shaped left ventricle), consistent with right ventricular volume overload. 2. Right ventricular systolic function is moderately reduced. The right ventricular size is mildly enlarged. 3. Right atrial size was mildly dilated. 4. The mitral valve is normal in structure. Mild mitral valve regurgitation. No evidence of mitral stenosis. 5. The aortic valve is normal in structure. Aortic valve regurgitation is trivial. No aortic stenosis is present. 6. The inferior vena cava is normal in size with greater than 50% respiratory variability, suggesting right atrial pressure of 3 mmHg. Echo  06/02/20  1. Poor acoustic windows limiit. 2. Left ventricular ejection fraction, by estimation, is 35 to 40%. The left ventricle has moderately decreased function. The left ventricle demonstrates global hypokinesis. There is mild left ventricular hypertrophy. Left ventricular diastolic parameters are consistent  with Grade III diastolic dysfunction (restrictive). 3. Right ventricular systolic function moderate to severely reduced. The right ventricular size is moderately enlarged. 4. Left atrial size was moderately dilated. 5. Right atrial size was moderately dilated. 6. The mitral valve is normal in structure. Moderate mitral valve regurgitation. 7. The aortic valve is normal in structure. Aortic valve regurgitation is not visualized. 8. The inferior vena cava is normal in size with <50% respiratory variability, suggesting right atrial pressure of 8 mmHg.  Limited echo 10.12.21  Limited study. 2. Left ventricular ejection fraction, by estimation, is 35 to 40%. The left ventricle has moderately decreased function. The left ventricle demonstrates global hypokinesis.  No obvious LV mural thrombus. 3. Right ventricular systolic function is moderately reduced. The right ventricular size is moderately enlarged. Mildly increased right ventricular wall thickness. 4. The inferior vena cava is normal in size with greater than 50% respiratory variability, suggesting right atrial pressure of 3 mmHg.  R and L heart cath 07/01/20  No left main. Separate ostia of LAD and circumflex. No angiographically apparent CAD.  There is mild to moderate left ventricular systolic dysfunction.  LV end diastolic pressure is normal.  The left ventricular ejection fraction is 35-45% by visual estimate.  There is no aortic valve stenosis.  Ao 95%, PA 73%, PA pressure 21/6, mean PA pressure 12 mm Hg; PCWP 9 mm Hg; CO 5.9 L/min; CI 3.29  Normal right heart pressures.   Continue preventive therapy. Nonischemic cardiomyopathy.     Lipid Panel No results found for: CHOL, TRIG, HDL, CHOLHDL, VLDL, LDLCALC, LDLDIRECT    Wt Readings from Last 3 Encounters:  10/23/20 180 lb 9.6 oz (81.9 kg)  09/24/20 177 lb (80.3 kg)  08/21/20 176 lb (79.8 kg)      ASSESSMENT AND PLAN:  1  Chronic systolic CHF   Recent echo  shows LVEF is improved   Minimally decreased.   I have reviewed images   I think RV function is better than reported  She is on b blocker and Entresto   BP is a little low     I have asked her to hold Entresto 1 day and see if she has more energy   IF she does then I would consider putting her on an ARB alone to let BP run up   If no difference than keep on Entresto  2  Tachycardia   Pt is s/p Ablation in Feb   Will follow with Rosette Reveal  3  Hx of TOF  S/p repair in 1975  4  Hx PPM   Device is nonfunctional    Address with EP          Current medicines are reviewed at length with the patient today.  The patient does not have concerns regarding medicines.  Signed, Dietrich Pates, MD  10/23/2020 2:51 PM    [facility] [address] [place], [place], [place]  [address] Phone: [phone]; Fax: [phone]

## 2020-11-03 DIAGNOSIS — F4320 Adjustment disorder, unspecified: Secondary | ICD-10-CM | POA: Diagnosis not present

## 2020-11-17 DIAGNOSIS — F4323 Adjustment disorder with mixed anxiety and depressed mood: Secondary | ICD-10-CM | POA: Diagnosis not present

## 2020-12-21 ENCOUNTER — Other Ambulatory Visit: Payer: Self-pay | Admitting: Internal Medicine

## 2021-01-04 ENCOUNTER — Emergency Department (HOSPITAL_COMMUNITY): Payer: BC Managed Care – PPO

## 2021-01-04 ENCOUNTER — Other Ambulatory Visit: Payer: Self-pay

## 2021-01-04 ENCOUNTER — Emergency Department (HOSPITAL_COMMUNITY)
Admission: EM | Admit: 2021-01-04 | Discharge: 2021-01-04 | Disposition: A | Discharge status: Home / Self Care | Payer: BC Managed Care – PPO | Attending: Emergency Medicine | Admitting: Emergency Medicine

## 2021-01-04 ENCOUNTER — Encounter (HOSPITAL_COMMUNITY): Payer: Self-pay

## 2021-01-04 DIAGNOSIS — Z87891 Personal history of nicotine dependence: Secondary | ICD-10-CM | POA: Diagnosis not present

## 2021-01-04 DIAGNOSIS — I509 Heart failure, unspecified: Secondary | ICD-10-CM | POA: Insufficient documentation

## 2021-01-04 DIAGNOSIS — Z91040 Latex allergy status: Secondary | ICD-10-CM | POA: Diagnosis not present

## 2021-01-04 DIAGNOSIS — Z79899 Other long term (current) drug therapy: Secondary | ICD-10-CM | POA: Insufficient documentation

## 2021-01-04 DIAGNOSIS — I110 Hypertensive heart disease with heart failure: Secondary | ICD-10-CM | POA: Diagnosis not present

## 2021-01-04 DIAGNOSIS — I517 Cardiomegaly: Secondary | ICD-10-CM | POA: Diagnosis not present

## 2021-01-04 DIAGNOSIS — M79602 Pain in left arm: Secondary | ICD-10-CM | POA: Diagnosis not present

## 2021-01-04 DIAGNOSIS — R0789 Other chest pain: Secondary | ICD-10-CM | POA: Diagnosis not present

## 2021-01-04 DIAGNOSIS — R079 Chest pain, unspecified: Secondary | ICD-10-CM | POA: Diagnosis not present

## 2021-01-04 DIAGNOSIS — R0602 Shortness of breath: Secondary | ICD-10-CM | POA: Diagnosis not present

## 2021-01-04 LAB — CBC WITH DIFFERENTIAL/PLATELET
Abs Immature Granulocytes: 0.01 10*3/uL (ref 0.00–0.07)
Basophils Absolute: 0 10*3/uL (ref 0.0–0.1)
Basophils Relative: 1 %
Eosinophils Absolute: 0.1 10*3/uL (ref 0.0–0.5)
Eosinophils Relative: 2 %
HCT: 40.4 % (ref 36.0–46.0)
Hemoglobin: 13 g/dL (ref 12.0–15.0)
Immature Granulocytes: 0 %
Lymphocytes Relative: 35 %
Lymphs Abs: 2.2 10*3/uL (ref 0.7–4.0)
MCH: 29.3 pg (ref 26.0–34.0)
MCHC: 32.2 g/dL (ref 30.0–36.0)
MCV: 91.2 fL (ref 80.0–100.0)
Monocytes Absolute: 0.5 10*3/uL (ref 0.1–1.0)
Monocytes Relative: 8 %
Neutro Abs: 3.3 10*3/uL (ref 1.7–7.7)
Neutrophils Relative %: 54 %
Platelets: 238 10*3/uL (ref 150–400)
RBC: 4.43 MIL/uL (ref 3.87–5.11)
RDW: 13.6 % (ref 11.5–15.5)
WBC: 6.2 10*3/uL (ref 4.0–10.5)
nRBC: 0 % (ref 0.0–0.2)

## 2021-01-04 LAB — COMPREHENSIVE METABOLIC PANEL
ALT: 18 U/L (ref 0–44)
AST: 16 U/L (ref 15–41)
Albumin: 3.8 g/dL (ref 3.5–5.0)
Alkaline Phosphatase: 57 U/L (ref 38–126)
Anion gap: 6 (ref 5–15)
BUN: 15 mg/dL (ref 6–20)
CO2: 27 mmol/L (ref 22–32)
Calcium: 8.9 mg/dL (ref 8.9–10.3)
Chloride: 106 mmol/L (ref 98–111)
Creatinine, Ser: 0.69 mg/dL (ref 0.44–1.00)
GFR, Estimated: >60 mL/min (ref >60)
Glucose, Bld: 79 mg/dL (ref 70–99)
Potassium: 4 mmol/L (ref 3.5–5.1)
Sodium: 139 mmol/L (ref 135–145)
Total Bilirubin: 0.6 mg/dL (ref 0.3–1.2)
Total Protein: 6.9 g/dL (ref 6.5–8.1)

## 2021-01-04 LAB — TROPONIN I (HIGH SENSITIVITY)
Troponin I (High Sensitivity): 2 ng/L (ref <18)
Troponin I (High Sensitivity): 2 ng/L (ref <18)

## 2021-01-04 MED ORDER — PREDNISONE 20 MG PO TABS: 20.0000 mg | ORAL_TABLET | Freq: Every day | ORAL | 0 refills | Status: AC

## 2021-01-04 MED ORDER — ACETAMINOPHEN 500 MG PO TABS
1000.0000 mg | ORAL_TABLET | Freq: Once | ORAL | Status: AC
  Administered 2021-01-04: 1000 mg via ORAL
  Filled 2021-01-04: qty 2

## 2021-01-04 MED ORDER — CYCLOBENZAPRINE HCL 10 MG PO TABS
10.0000 mg | ORAL_TABLET | Freq: Once | ORAL | Status: AC
  Administered 2021-01-04: 10 mg via ORAL
  Filled 2021-01-04: qty 1

## 2021-01-04 NOTE — ED Triage Notes (Signed)
Pt presents to ED with complaints of mid chest pain down left arm with left arm tingling and shortness of breath. Pain also radiates around left shoulder blade.

## 2021-01-04 NOTE — ED Provider Notes (Signed)
[facility] EMERGENCY DEPARTMENT Provider Note   CSN: [id number] Arrival date & time: 01/04/21  1425     History Chief Complaint  Patient presents with  . Chest Pain    Tonya Gardner is a 50 y.o. female.  HPI   Patient with significant medical history of tetralogy of Fallot, SVT with ablations, CHF, headaches, presents to the emergency department with chief complaint of left-sided chest pain and left arm pain.  Patient states this started suddenly this morning around 7 AM, describes the pain as a constant pressure-like sensation in her left chest which then will wax and wane in intensity and feel pain shooting down her arm, pain is worsened with left arm movement and movement in general, it was initially associated shortness of breath but now she thinks shortness of breath only when the pain becomes intense, she denies becoming diaphoretic, nauseous, vomiting, lightheadedness or dizziness.  She denies heart attacks or strokes, denies illicit drug use, does not smoke, is nondiabetic, she has no history of PEs or DVTs, currently not on hormone therapy.  She denies recent sick contacts, is up-to-date on her COVID and her influenza, she denies any alleviating factors.  Patient denies headaches, fevers, chills, abdominal pain, nausea, vomit, diarrhea, worsening pedal edema.  Past Medical History:  Diagnosis Date  . CHF (congestive heart failure) (HCC) 2002   with pregnancy  . Headache   . Presence of permanent cardiac pacemaker    off for 2 years  . Seizures (HCC) 2002   due to eclampsia  . Sinus bradycardia   . Tetralogy of Fallot s/p repair 1975    Patient Active Problem List   Diagnosis Date Noted  . Abnormal echocardiogram   . Shortness of breath   . Paroxysmal A-fib (HCC) 06/02/2020  . Supraventricular tachycardia (HCC) 06/02/2020  . History of CHF (congestive heart failure) 06/02/2020  . Hyperglycemia 06/02/2020  . Obesity (BMI 30-39.9) 06/02/2020  . History of tetralogy  of Fallot 06/02/2020  . Menorrhagia 08/12/2014  . ESSENTIAL HYPERTENSION, BENIGN 10/16/2009  . CARDIAC PACEMAKER IN SITU 10/16/2009  . SINUS BRADYCARDIA 10/13/2009  . CHF 10/13/2009    Past Surgical History:  Procedure Laterality Date  . ABDOMINAL HYSTERECTOMY N/A 08/12/2014   Procedure: HYSTERECTOMY ABDOMINAL;  Surgeon: Jeani Hawking, MD;  Location: [facility];  Service: Gynecology;  Laterality: N/A;  . CESAREAN SECTION    . LAPAROSCOPIC ASSISTED VAGINAL HYSTERECTOMY N/A 08/12/2014   Procedure: ATTEMPTED LAPAROSCOPIC ASSISTED VAGINAL HYSTERECTOMY;  Surgeon: Jeani Hawking, MD;  Location: [facility];  Service: Gynecology;  Laterality: N/A;  . PACEMAKER INSERTION     [employer]   . RIGHT/LEFT HEART CATH AND CORONARY ANGIOGRAPHY N/A 07/01/2020   Procedure: RIGHT/LEFT HEART CATH AND CORONARY ANGIOGRAPHY;  Surgeon: Corky Crafts, MD;  Location: [facility] INVASIVE CV LAB;  Service: Cardiovascular;  Laterality: N/A;  . SALPINGOOPHORECTOMY Bilateral 08/12/2014   Procedure: SALPINGO OOPHORECTOMY;  Surgeon: Jeani Hawking, MD;  Location: [facility];  Service: Gynecology;  Laterality: Bilateral;  . SVT ABLATION N/A 08/21/2020   Procedure: SVT ABLATION;  Surgeon: Marinus Maw, MD;  Location: [facility] INVASIVE CV LAB;  Service: Cardiovascular;  Laterality: N/A;  . TETRALOGY OF FALLOT REPAIR    . TONSILLECTOMY       OB History    Gravida  3   Para      Term      Preterm      AB      Living  SAB      IAB      Ectopic      Multiple      Live Births  2           No family history on file.  Social History   Tobacco Use  . Smoking status: Former Games developer  . Smokeless tobacco: Never Used  Vaping Use  . Vaping Use: Never used  Substance Use Topics  . Alcohol use: Yes    Comment: occasionally  . Drug use: No    Home Medications Prior to Admission medications   Medication Sig Start Date End Date Taking? Authorizing Provider  cetirizine (ZYRTEC) 10 MG tablet Take 10 mg  by mouth daily as needed for allergies.   Yes [provider]  ENTRESTO 24-26 MG Take 1 tablet by mouth 2 (two) times daily. 09/11/20  Yes [provider]  ibuprofen (ADVIL) 200 MG tablet Take 400 mg by mouth every 8 (eight) hours as needed for mild pain or moderate pain (pain.).   Yes [provider]  metoprolol succinate (TOPROL XL) 25 MG 24 hr tablet Take 1 tablet (25 mg total) by mouth daily. Patient taking differently: Take 25 mg by mouth daily. 1 tablet in the morning and 1/2 tablet in the evening 09/24/20  Yes Marinus Maw, MD  ondansetron (ZOFRAN ODT) 4 MG disintegrating tablet 4mg  ODT q4 hours prn nausea/vomit 07/29/20  Yes Mesner, Barbara Cower, MD  pantoprazole (PROTONIX) 40 MG tablet TAKE 1 TABLET(40 MG) BY MOUTH DAILY Patient taking differently: Take 40 mg by mouth daily. 12/22/20  Yes Pricilla Riffle, MD  predniSONE (DELTASONE) 20 MG tablet Take 1 tablet (20 mg total) by mouth daily for 5 days. 01/04/21 01/09/21 Yes Carroll Sage, PA-C    Allergies    Sulfonamide derivatives and Latex  Review of Systems   Review of Systems  Constitutional: Negative for chills and fever.  HENT: Negative for congestion and sore throat.   Respiratory: Negative for shortness of breath.   Cardiovascular: Positive for chest pain.  Gastrointestinal: Negative for abdominal pain.  Genitourinary: Negative for enuresis.  Musculoskeletal: Negative for back pain.  Skin: Negative for rash.  Neurological: Negative for headaches.  Hematological: Does not bruise/bleed easily.    Physical Exam Updated Vital Signs BP (!) 102/43   Pulse 65   Temp 98.1 F (36.7 C) (Oral)   Resp 16   Ht 5\' 2"  (1.575 m)   Wt 83.9 kg   LMP 07/23/2014 (Approximate)   SpO2 100%   BMI 33.84 kg/m   Physical Exam Vitals and nursing note reviewed.  Constitutional:      General: She is not in acute distress.    Appearance: She is not ill-appearing.  HENT:     Head: Normocephalic and atraumatic.      Nose: No congestion.  Eyes:     Conjunctiva/sclera: Conjunctivae normal.  Cardiovascular:     Rate and Rhythm: Normal rate and regular rhythm.     Pulses: Normal pulses.     Heart sounds: No murmur heard. No friction rub. No gallop.      Comments: Chest pain was reproducible with palpation, there is no noted crepitus or deformities.  Pulmonary:     Effort: No respiratory distress.     Breath sounds: No wheezing, rhonchi or rales.  Abdominal:     Palpations: Abdomen is soft.     Tenderness: There is no abdominal tenderness.  Musculoskeletal:     Right lower leg:  No edema.     Left lower leg: No edema.     Comments: Patient had tenderness along her left shoulder blade, no deformities present, patient 5 5 strength, neurovascular intact in the upper extremities.  Skin:    General: Skin is warm and dry.  Neurological:     Mental Status: She is alert.  Psychiatric:        Mood and Affect: Mood normal.     ED Results / Procedures / Treatments   Labs (all labs ordered are listed, but only abnormal results are displayed) Labs Reviewed  COMPREHENSIVE METABOLIC PANEL  CBC WITH DIFFERENTIAL/PLATELET  TROPONIN I (HIGH SENSITIVITY)  TROPONIN I (HIGH SENSITIVITY)    EKG EKG Interpretation  Date/Time:  Sunday Jan 04 2021 14:39:55 EDT Ventricular Rate:  68 PR Interval:  124 QRS Duration: 157 QT Interval:  442 QTC Calculation: 471 R Axis:   86 Text Interpretation: Sinus rhythm Right bundle branch block Confirmed by Bethann Berkshire [phone]) on 01/04/2021 2:59:42 PM   Radiology DG Chest Port 1 View  Result Date: 01/04/2021 CLINICAL DATA:  Chest pain. Mid chest pain radiates into LEFT arm. Shortness of breath. EXAM: PORTABLE CHEST 1 VIEW COMPARISON:  06/02/2020 FINDINGS: Patient's LEFT-sided transvenous pacemaker leads to the RIGHT atrium and RIGHT ventricle. The heart is enlarged and stable in configuration. The lungs are clear. There is no pulmonary edema. IMPRESSION: Stable  cardiomegaly. Electronically Signed   By: Norva Pavlov M.D.   On: 01/04/2021 16:21    Procedures Procedures   Medications Ordered in ED Medications  cyclobenzaprine (FLEXERIL) tablet 10 mg (10 mg Oral Given 01/04/21 1543)  acetaminophen (TYLENOL) tablet 1,000 mg (1,000 mg Oral Given 01/04/21 1543)    ED Course  I have reviewed the triage vital signs and the nursing notes.  Pertinent labs & imaging results that were available during my care of the patient were reviewed by me and considered in my medical decision making (see chart for details).    MDM Rules/Calculators/A&P                         Initial impression-patient presents with chest pain.  She is alert, does not appear in acute distress, vital signs reassuring.  Suspect this is a muscular strain, will obtain chest pain work-up, provide patient with pain medications and reassess.  Work-up-CBC unremarkable, CMP unremarkable, per troponin is 2, second troponin 2 chest x-ray negative for acute findings.  EKG sinus rhythm with right bundle block  Reassessment-patient reassessed after Tylenol and muscle relaxers, she still has pain in her left shoulder, pain has remained unchanged.  Vital signs have remained stable.  Updated her on lab work and imaging patient is agreeable for discharge at this time.   Rule out-patient has a heart score of 3, I have low suspicion for ACS as history is atypical, EKG was sinus rhythm without signs of ischemia, patient had a delta troponin.  Low suspicion for PE as patient denies pleuritic chest pain, shortness of breath, patient denies leg pain, no pedal edema noted on exam, patient was PERC negative.  Low suspicion for AAA or aortic dissection as history is atypical, patient has low risk factors.  Low suspicion for systemic infection as patient is nontoxic-appearing, vital signs reassuring, no obvious source infection noted on exam.   Plan-  1.  Chest pain with unknown etiology-suspect this is  secondary due to muscular strain but differential diagnosis includes referral pain from spine, costochondritis, anxiety,  acid reflux.  Will recommend over-the-counter pain medications, provide patient with a short course of steroids, have her follow-up with cardiologist and orthopedics for further evaluation.  Vital signs have remained stable, no indication for hospital admission.  Patient discussed with attending and they agreed with assessment and plan.  Patient given at home care as well strict return precautions.  Patient verbalized that they understood agreed to said plan.   Final Clinical Impression(s) / ED Diagnoses Final diagnoses:  Atypical chest pain    Rx / DC Orders ED Discharge Orders         Ordered    predniSONE (DELTASONE) 20 MG tablet  Daily        01/04/21 1833           Carroll Sage, PA-C 01/04/21 Jolene Provost, MD 01/07/21 [phone]

## 2021-01-04 NOTE — Discharge Instructions (Addendum)
Lab work and imaging look reassuring, I have started you on steroids please take as prescribed.  For pain I recommend over-the-counter pain medication like ibuprofen and Tylenol every 6 hours as needed please follow dosing on the back of bottle.  I want you to follow-up with your cardiologist and orthopedic surgery for further evaluation of your chest pain/shoulder pain.  Come back to the emergency department if you develop chest pain, shortness of breath, severe abdominal pain, uncontrolled nausea, vomiting, diarrhea.

## 2021-01-06 ENCOUNTER — Telehealth: Payer: Self-pay | Admitting: Internal Medicine

## 2021-01-06 DIAGNOSIS — Z8774 Personal history of (corrected) congenital malformations of heart and circulatory system: Secondary | ICD-10-CM

## 2021-01-06 DIAGNOSIS — R0602 Shortness of breath: Secondary | ICD-10-CM

## 2021-01-06 DIAGNOSIS — I471 Supraventricular tachycardia: Secondary | ICD-10-CM

## 2021-01-06 NOTE — Telephone Encounter (Signed)
Please set up pt for 1.   BMET, BNP and ESR  2.  Appt to see me (OK to overbook   I am in clinic on Friday)

## 2021-01-06 NOTE — Telephone Encounter (Signed)
Called pt and left message to check MyChart for recommendations.  Scheduled appt 5/20 with Dr. Tenny Craw and placed lab orders.

## 2021-01-06 NOTE — Addendum Note (Signed)
Addended by: Lendon Ka on: 01/06/2021 08:26 AM   Modules accepted: Orders

## 2021-01-07 ENCOUNTER — Other Ambulatory Visit: Payer: BC Managed Care – PPO | Admitting: *Deleted

## 2021-01-07 ENCOUNTER — Other Ambulatory Visit: Payer: Self-pay

## 2021-01-07 DIAGNOSIS — Z8774 Personal history of (corrected) congenital malformations of heart and circulatory system: Secondary | ICD-10-CM | POA: Diagnosis not present

## 2021-01-07 DIAGNOSIS — R0602 Shortness of breath: Secondary | ICD-10-CM

## 2021-01-07 DIAGNOSIS — I471 Supraventricular tachycardia: Secondary | ICD-10-CM

## 2021-01-08 LAB — BASIC METABOLIC PANEL
BUN/Creatinine Ratio: 20 (ref 9–23)
BUN: 17 mg/dL (ref 6–24)
CO2: 25 mmol/L (ref 20–29)
Calcium: 9.5 mg/dL (ref 8.7–10.2)
Chloride: 105 mmol/L (ref 96–106)
Creatinine, Ser: 0.83 mg/dL (ref 0.57–1.00)
Glucose: 79 mg/dL (ref 65–99)
Potassium: 4 mmol/L (ref 3.5–5.2)
Sodium: 142 mmol/L (ref 134–144)
eGFR: 86 mL/min/{1.73_m2} (ref >59)

## 2021-01-08 LAB — SEDIMENTATION RATE: Sed Rate: 9 mm/hr (ref 0–40)

## 2021-01-08 LAB — PRO B NATRIURETIC PEPTIDE: NT-Pro BNP: 114 pg/mL (ref 0–249)

## 2021-01-08 NOTE — Progress Notes (Signed)
Cardiology Office Note   Date:  01/09/2021   ID:  Tonya Gardner, DOB [birth year], MRN [id number]  PCP:  Pcp, No  Cardiologist:   Dietrich Pates, MD   Pt presents for f/u of CP     History of Present Illness: Tonya Gardner is a 50 y.o. female with a history of Tetralogy f Fallot (s/p repair in 1975), bradycardia (symptomatic in 2002 with PPM;  battery at end of life 2012).   She was admitted in fall 2021  to [facility] for dizziess, SOB, diaphoresis.   EKG showed SVT   Slowed with adenosine then converted to SR  I saw her in consult when she was admitted to [facility]   She said prior to the admission she had been doing OK   Had just gotten back from [place]  Did fine with walking   No SOB   She denied palpitatoins prior   Echo was done on 10/11 and 10/12  SHowed moderate LV and RV dysfunction.   No echo prior     The pt was sent home with plan for f/u in clinic and  echo in a few months   She was sent home on Toprol The pt continued to feel badly, SOB   Set up for R and L heart catheterization   This showed normal Pressures, normal coronary arteries      She was placed on Entresto.   She then had  recurrent tachycardia and was seen by EP  Went on to have ablation of atrial tachy x 2 and atrial flutter.  After procedure she improved clinicially  Breathing good   Less fatigue   Followed by Rosette Reveal I saw the pt in clinic earlier this spring    The patient was recently seen in the emergency room (01/04/2021).  For chest pain.  Worke upReview of the record from that visit the pain reportedly started suddenly at around 7 AM.  It was a constant pressure sensation in her left chest that would wax and wane.  Associated with radiation, shooting down left arm  Worse with movement initially.  She was short of breath which she felt was because it was so intense.  Pain was reproducible with palpation left arm tender.  The pt was given Rx for prednisone and a muscle relaxer   Overall pain felt to be more muscular.  I saw  the records and ordered a BMET and BNP   Both were normal.  Sed rate was negativ  Overall, since seen she feels it is getting better    She denies fevers, no chills.  No cough.  No chest tightness anteriorly now.  Current Meds  Medication Sig  . cetirizine (ZYRTEC) 10 MG tablet Take 10 mg by mouth daily as needed for allergies.  Marland Kitchen ENTRESTO 24-26 MG Take 1 tablet by mouth 2 (two) times daily.  Marland Kitchen ibuprofen (ADVIL) 200 MG tablet Take 400 mg by mouth every 8 (eight) hours as needed for mild pain or moderate pain (pain.).  Marland Kitchen metoprolol succinate (TOPROL XL) 25 MG 24 hr tablet Take 1 tablet (25 mg total) by mouth daily.  . ondansetron (ZOFRAN ODT) 4 MG disintegrating tablet 4mg  ODT q4 hours prn nausea/vomit  . pantoprazole (PROTONIX) 40 MG tablet TAKE 1 TABLET(40 MG) BY MOUTH DAILY  . predniSONE (DELTASONE) 20 MG tablet Take 1 tablet (20 mg total) by mouth daily for 5 days.     Allergies:   Sulfonamide derivatives and Latex  Past Medical History:  Diagnosis Date  . CHF (congestive heart failure) (HCC) 2002   with pregnancy  . Headache   . Presence of permanent cardiac pacemaker    off for 2 years  . Seizures (HCC) 2002   due to eclampsia  . Sinus bradycardia   . Tetralogy of Fallot s/p repair 1975    Past Surgical History:  Procedure Laterality Date  . ABDOMINAL HYSTERECTOMY N/A 08/12/2014   Procedure: HYSTERECTOMY ABDOMINAL;  Surgeon: Jeani Hawking, MD;  Location: [facility];  Service: Gynecology;  Laterality: N/A;  . CESAREAN SECTION    . LAPAROSCOPIC ASSISTED VAGINAL HYSTERECTOMY N/A 08/12/2014   Procedure: ATTEMPTED LAPAROSCOPIC ASSISTED VAGINAL HYSTERECTOMY;  Surgeon: Jeani Hawking, MD;  Location: [facility];  Service: Gynecology;  Laterality: N/A;  . PACEMAKER INSERTION     [employer]   . RIGHT/LEFT HEART CATH AND CORONARY ANGIOGRAPHY N/A 07/01/2020   Procedure: RIGHT/LEFT HEART CATH AND CORONARY ANGIOGRAPHY;  Surgeon: Corky Crafts, MD;  Location: [facility] INVASIVE CV LAB;   Service: Cardiovascular;  Laterality: N/A;  . SALPINGOOPHORECTOMY Bilateral 08/12/2014   Procedure: SALPINGO OOPHORECTOMY;  Surgeon: Jeani Hawking, MD;  Location: [facility];  Service: Gynecology;  Laterality: Bilateral;  . SVT ABLATION N/A 08/21/2020   Procedure: SVT ABLATION;  Surgeon: Marinus Maw, MD;  Location: [facility] INVASIVE CV LAB;  Service: Cardiovascular;  Laterality: N/A;  . TETRALOGY OF FALLOT REPAIR    . TONSILLECTOMY       Social History:  The patient  reports that she has quit smoking. She has never used smokeless tobacco. She reports current alcohol use. She reports that she does not use drugs.   Family History:  The patient's family history is not on file.    ROS:  Please see the history of present illness. All other systems are reviewed and  Negative to the above problem except as noted.    PHYSICAL EXAM: VS:  BP 108/62   Pulse 66   Ht 5\' 2"  (1.575 m)   Wt 194 lb 6.4 oz (88.2 kg)   LMP 07/23/2014 (Approximate)   SpO2 96%   BMI 35.56 kg/m   GEN:  Obese 50 yo  in no acute distress  HEENT: normal  Neck: no JVD, carotid bruits Cardiac: RRR; no murmurs  No LE edema Back: Mild tenderness suprascapular region. Respiratory:  clear to auscultation bilaterally, GI: soft, nontender, nondistended, + BS  No hepatomegaly  MS: no deformity Moving all extremities   Skin: warm and dry, no rash Neuro:  Strength and sensation are intact Psych: euthymic mood, full affect   EKG:  EKG is not ordered today     Echo  10/03/20  Left ventricular ejection fraction, by estimation, is 45 to 50%. The left ventricle has mildly decreased function. The left ventricle demonstrates global hypokinesis. Left ventricular diastolic parameters are indeterminate. There is the interventricular septum is flattened in diastole ('D' shaped left ventricle), consistent with right ventricular volume overload. 2. Right ventricular systolic function is moderately reduced. The right ventricular size is  mildly enlarged. 3. Right atrial size was mildly dilated. 4. The mitral valve is normal in structure. Mild mitral valve regurgitation. No evidence of mitral stenosis. 5. The aortic valve is normal in structure. Aortic valve regurgitation is trivial. No aortic stenosis is present. 6. The inferior vena cava is normal in size with greater than 50% respiratory variability, suggesting right atrial pressure of 3 mmHg. Echo  06/02/20  1. Poor acoustic windows limiit. 2. Left  ventricular ejection fraction, by estimation, is 35 to 40%. The left ventricle has moderately decreased function. The left ventricle demonstrates global hypokinesis. There is mild left ventricular hypertrophy. Left ventricular diastolic parameters are consistent with Grade III diastolic dysfunction (restrictive). 3. Right ventricular systolic function moderate to severely reduced. The right ventricular size is moderately enlarged. 4. Left atrial size was moderately dilated. 5. Right atrial size was moderately dilated. 6. The mitral valve is normal in structure. Moderate mitral valve regurgitation. 7. The aortic valve is normal in structure. Aortic valve regurgitation is not visualized. 8. The inferior vena cava is normal in size with <50% respiratory variability, suggesting right atrial pressure of 8 mmHg.  Limited echo 10.12.21  Limited study. 2. Left ventricular ejection fraction, by estimation, is 35 to 40%. The left ventricle has moderately decreased function. The left ventricle demonstrates global hypokinesis. No obvious LV mural thrombus. 3. Right ventricular systolic function is moderately reduced. The right ventricular size is moderately enlarged. Mildly increased right ventricular wall thickness. 4. The inferior vena cava is normal in size with greater than 50% respiratory variability, suggesting right atrial pressure of 3 mmHg.  R and L heart cath 07/01/20  No left main. Separate ostia of LAD and  circumflex. No angiographically apparent CAD.  There is mild to moderate left ventricular systolic dysfunction.  LV end diastolic pressure is normal.  The left ventricular ejection fraction is 35-45% by visual estimate.  There is no aortic valve stenosis.  Ao 95%, PA 73%, PA pressure 21/6, mean PA pressure 12 mm Hg; PCWP 9 mm Hg; CO 5.9 L/min; CI 3.29  Normal right heart pressures.   Continue preventive therapy. Nonischemic cardiomyopathy.     Lipid Panel No results found for: CHOL, TRIG, HDL, CHOLHDL, VLDL, LDLCALC, LDLDIRECT    Wt Readings from Last 3 Encounters:  01/09/21 194 lb 6.4 oz (88.2 kg)  01/04/21 185 lb (83.9 kg)  10/23/20 180 lb 9.6 oz (81.9 kg)      ASSESSMENT AND PLAN:  1 chest pain/back pain the episode sounds more muscular.  Labs were okay.  On exam today she looks okay.   I do not think it is cardiac.  Follow clinically     2.  Chronic systolic CHF   Recent echo shows LVEF is improved   volume status again looks okay.  She is tolerating current regimen.  I would continue    2  Tachycardia   Pt is s/p Ablation in Feb   Will follow with Rosette Reveal.  She has an appointment in early June  3  Hx of TOF  S/p repair in 1975  4  Hx PPM   Device is nonfunctional    Address with EP.  She has an appointment again with Ladona Ridgel        Current medicines are reviewed at length with the patient today.  The patient does not have concerns regarding medicines.  Signed, Dietrich Pates, MD  01/09/2021 9:11 AM    [facility] [address] [place], [place], [place]  [address] Phone: [phone]; Fax: [phone]

## 2021-01-09 ENCOUNTER — Encounter: Payer: Self-pay | Admitting: Internal Medicine

## 2021-01-09 ENCOUNTER — Other Ambulatory Visit: Payer: Self-pay

## 2021-01-09 ENCOUNTER — Ambulatory Visit (INDEPENDENT_AMBULATORY_CARE_PROVIDER_SITE_OTHER): Payer: BC Managed Care – PPO | Admitting: Internal Medicine

## 2021-01-09 VITALS — BP 108/62 | HR 66 | Ht 62.0 in | Wt 194.4 lb

## 2021-01-09 DIAGNOSIS — R079 Chest pain, unspecified: Secondary | ICD-10-CM | POA: Diagnosis not present

## 2021-01-09 NOTE — Patient Instructions (Signed)
Medication Instructions:  No changes *If you need a refill on your cardiac medications before your next appointment, please call your pharmacy*  Lab Work: none If you have labs (blood work) drawn today and your tests are completely normal, you will receive your results only by: Marland Kitchen MyChart Message (if you have MyChart) OR . A paper copy in the mail If you have any lab test that is abnormal or we need to change your treatment, we will call you to review the results.   Testing/Procedures: none  Follow-Up: As scheduled  Other Instructions

## 2021-01-27 ENCOUNTER — Ambulatory Visit: Payer: BC Managed Care – PPO | Admitting: Internal Medicine

## 2021-01-30 ENCOUNTER — Ambulatory Visit: Payer: BC Managed Care – PPO | Admitting: Internal Medicine

## 2021-02-06 DIAGNOSIS — M79672 Pain in left foot: Secondary | ICD-10-CM | POA: Diagnosis not present

## 2021-02-06 DIAGNOSIS — M79671 Pain in right foot: Secondary | ICD-10-CM | POA: Diagnosis not present

## 2021-02-06 DIAGNOSIS — M25572 Pain in left ankle and joints of left foot: Secondary | ICD-10-CM | POA: Diagnosis not present

## 2021-02-06 DIAGNOSIS — S93492A Sprain of other ligament of left ankle, initial encounter: Secondary | ICD-10-CM | POA: Diagnosis not present

## 2021-02-09 ENCOUNTER — Telehealth: Payer: Self-pay | Admitting: Internal Medicine

## 2021-02-09 NOTE — Telephone Encounter (Signed)
Will route this message to our EP [facility], to further assist the pt with question in switching her office visit with Dr. Ladona Ridgel (on 6/23) to virtual appt instead, due to not being able to get to the office due to not having a vehicle at this time.  [place] to follow-up with the pt.

## 2021-02-09 NOTE — Telephone Encounter (Signed)
Patient wanted to know if she could do her appt as a telemedicine appt instead of in office. Patient does not have a car at this time. Please assist

## 2021-02-10 NOTE — Telephone Encounter (Signed)
Appt changed to virtual visit.  

## 2021-02-12 ENCOUNTER — Other Ambulatory Visit (HOSPITAL_COMMUNITY): Payer: Self-pay | Admitting: Physician Assistant

## 2021-02-12 ENCOUNTER — Telehealth (INDEPENDENT_AMBULATORY_CARE_PROVIDER_SITE_OTHER): Payer: BC Managed Care – PPO | Admitting: Internal Medicine

## 2021-02-12 ENCOUNTER — Other Ambulatory Visit: Payer: Self-pay

## 2021-02-12 DIAGNOSIS — M79671 Pain in right foot: Secondary | ICD-10-CM

## 2021-02-12 NOTE — Progress Notes (Deleted)
No show

## 2021-02-24 ENCOUNTER — Other Ambulatory Visit (HOSPITAL_BASED_OUTPATIENT_CLINIC_OR_DEPARTMENT_OTHER): Payer: Self-pay | Admitting: Physician Assistant

## 2021-02-24 DIAGNOSIS — M79671 Pain in right foot: Secondary | ICD-10-CM

## 2021-02-27 ENCOUNTER — Other Ambulatory Visit: Payer: Self-pay

## 2021-02-27 ENCOUNTER — Ambulatory Visit (HOSPITAL_BASED_OUTPATIENT_CLINIC_OR_DEPARTMENT_OTHER)
Admission: RE | Admit: 2021-02-27 | Discharge: 2021-02-27 | Disposition: A | Discharge status: Home / Self Care | Payer: BC Managed Care – PPO | Source: Ambulatory Visit | Attending: Physician Assistant | Admitting: Physician Assistant

## 2021-02-27 DIAGNOSIS — M79671 Pain in right foot: Secondary | ICD-10-CM | POA: Diagnosis not present

## 2021-02-27 DIAGNOSIS — S92021A Displaced fracture of anterior process of right calcaneus, initial encounter for closed fracture: Secondary | ICD-10-CM | POA: Diagnosis not present

## 2021-03-03 DIAGNOSIS — S93491A Sprain of other ligament of right ankle, initial encounter: Secondary | ICD-10-CM | POA: Diagnosis not present

## 2021-03-03 DIAGNOSIS — S92024A Nondisplaced fracture of anterior process of right calcaneus, initial encounter for closed fracture: Secondary | ICD-10-CM | POA: Diagnosis not present

## 2021-03-03 DIAGNOSIS — S93492A Sprain of other ligament of left ankle, initial encounter: Secondary | ICD-10-CM | POA: Diagnosis not present

## 2021-03-16 NOTE — Progress Notes (Signed)
This was a no show. HPI  Allergies  Allergen Reactions   Sulfonamide Derivatives Hives   Latex Rash     Current Outpatient Medications  Medication Sig Dispense Refill   cetirizine (ZYRTEC) 10 MG tablet Take 10 mg by mouth daily as needed for allergies.     ENTRESTO 24-26 MG Take 1 tablet by mouth 2 (two) times daily.     ibuprofen (ADVIL) 200 MG tablet Take 400 mg by mouth every 8 (eight) hours as needed for mild pain or moderate pain (pain.).     metoprolol succinate (TOPROL XL) 25 MG 24 hr tablet Take 1 tablet (25 mg total) by mouth daily. 90 tablet 3   ondansetron (ZOFRAN ODT) 4 MG disintegrating tablet 4mg  ODT q4 hours prn nausea/vomit 15 tablet 0   pantoprazole (PROTONIX) 40 MG tablet TAKE 1 TABLET(40 MG) BY MOUTH DAILY 30 tablet 9   No current facility-administered medications for this visit.     Past Medical History:  Diagnosis Date   CHF (congestive heart failure) (HCC) 2002   with pregnancy   Headache    Presence of permanent cardiac pacemaker    off for 2 years   Seizures (HCC) 2002   due to eclampsia   Sinus bradycardia    Tetralogy of Fallot s/p repair 1975    ROS:   All systems reviewed and negative except as noted in the HPI.   Past Surgical History:  Procedure Laterality Date   ABDOMINAL HYSTERECTOMY N/A 08/12/2014   Procedure: HYSTERECTOMY ABDOMINAL;  Surgeon: 08/14/2014, MD;  Location: [facility];  Service: Gynecology;  Laterality: N/A;   CESAREAN SECTION     LAPAROSCOPIC ASSISTED VAGINAL HYSTERECTOMY N/A 08/12/2014   Procedure: ATTEMPTED LAPAROSCOPIC ASSISTED VAGINAL HYSTERECTOMY;  Surgeon: 08/14/2014, MD;  Location: [facility];  Service: Gynecology;  Laterality: N/A;   PACEMAKER INSERTION     [employer]    RIGHT/LEFT HEART CATH AND CORONARY ANGIOGRAPHY N/A 07/01/2020   Procedure: RIGHT/LEFT HEART CATH AND CORONARY ANGIOGRAPHY;  Surgeon: 13/04/2020, MD;  Location: [facility] INVASIVE CV LAB;  Service: Cardiovascular;  Laterality: N/A;    SALPINGOOPHORECTOMY Bilateral 08/12/2014   Procedure: SALPINGO OOPHORECTOMY;  Surgeon: 08/14/2014, MD;  Location: [facility];  Service: Gynecology;  Laterality: Bilateral;   SVT ABLATION N/A 08/21/2020   Procedure: SVT ABLATION;  Surgeon: 08/23/2020, MD;  Location: [facility] INVASIVE CV LAB;  Service: Cardiovascular;  Laterality: N/A;   TETRALOGY OF FALLOT REPAIR     TONSILLECTOMY       No family history on file.   Social History   Socioeconomic History   Marital status: Married    Spouse name: Not on file   Number of children: Not on file   Years of education: Not on file   Highest education level: Not on file  Occupational History   Not on file  Tobacco Use   Smoking status: Former   Smokeless tobacco: Never  Vaping Use   Vaping Use: Never used  Substance and Sexual Activity   Alcohol use: Yes    Comment: occasionally   Drug use: No   Sexual activity: Yes    Birth control/protection: None  Other Topics Concern   Not on file  Social History Narrative   Not on file   Social Determinants of Health   Financial Resource Strain: Not on file  Food Insecurity: Not on file  Transportation Needs: Not on file  Physical Activity: Not on file  Stress: Not on file  Social Connections: Not on file  Intimate Partner Violence: Not on file     LMP 07/23/2014 (Approximate)   Physical Exam:  Well appearing NAD HEENT: Unremarkable Neck:  No JVD, no thyromegally Lymphatics:  No adenopathy Back:  No CVA tenderness Lungs:  Clear HEART:  Regular rate rhythm, no murmurs, no rubs, no clicks Abd:  soft, positive bowel sounds, no organomegally, no rebound, no guarding Ext:  2 plus pulses, no edema, no cyanosis, no clubbing Skin:  No rashes no nodules Neuro:  CN II through XII intact, motor grossly intact  EKG  DEVICE  Normal device function.  See PaceArt for details.   Assess/Plan:

## 2021-05-14 ENCOUNTER — Ambulatory Visit: Payer: BC Managed Care – PPO | Admitting: Internal Medicine

## 2021-07-31 ENCOUNTER — Other Ambulatory Visit: Payer: Self-pay | Admitting: Internal Medicine

## 2021-11-14 ENCOUNTER — Other Ambulatory Visit: Payer: Self-pay | Admitting: Internal Medicine

## 2021-11-30 ENCOUNTER — Encounter: Payer: Self-pay | Admitting: Internal Medicine

## 2021-11-30 MED ORDER — ENTRESTO 24-26 MG PO TABS: 1.0000 | ORAL_TABLET | Freq: Two times a day (BID) | ORAL | 4 refills | Status: DC

## 2021-11-30 MED ORDER — METOPROLOL SUCCINATE ER 25 MG PO TB24: 25.0000 mg | ORAL_TABLET | Freq: Two times a day (BID) | ORAL | 4 refills | Status: DC

## 2022-03-05 ENCOUNTER — Other Ambulatory Visit (HOSPITAL_COMMUNITY): Payer: BC Managed Care – PPO

## 2022-03-26 NOTE — Progress Notes (Signed)
Cardiology Office Note   Date:  03/29/2022   ID:  Tonya Gardner, DOB October 18, 1970, MRN [id number]  PCP:  Pcp, No  Cardiologist:   Dietrich Pates, MD   Pt presents for f/u of CP     History of Present Illness: Tonya Gardner is a 51 y.o. female with a history of Tetralogy of Fallot (s/p repair in 1975), bradycardia (symptomatic PPM, 2022; battery at end of life 2012).   She was admitted in fall 2021 with SVT.   Slowed with adenosine then converted to SR   She denied palpitatoins prior   Echo was done on  SHowed moderate LV and RV dysfunction.    The pt was sent home on Toprol with plan for f/u in clinic and  echo in a few months    The pt continued to feel bad, SOB and she underwent  R and L heart catheterization in Nov 2021.   This showed normal Pressures, normal coronary arteries      She was placed on Entresto.   The pt had  recurrent tachycardia, was seen by EP  Went on to have ablation of atrial tachy x 2 and atrial flutter.  After procedure she improved clinicially   The pt was seen in ED for CP in May 2022.   Pain constant pressure that would wax/wane.  Went down L arm  WOrse with movement   Some SOB Arm pain reproducible with palpation.     Pt treated with prednisone and a muscle relaxer.     I last saw the pt in clinic in May 2022  Since then she has been very busy   New jobs   Drives all over state.    She is in the midst of divorcing    Should be over in Oct       Breathing has been a little rough thsi summer    Had a cough since July after being treated for URI    Pt notes occasional dizziness   HR has been 40s to 120s    Dizzy when low     Some swelling when on feet and driving   Occasional fluttering         Current Meds  Medication Sig   cetirizine (ZYRTEC) 10 MG tablet Take 10 mg by mouth daily as needed for allergies.   metoprolol succinate (TOPROL-XL) 25 MG 24 hr tablet Take 1 tablet (25 mg total) by mouth 2 (two) times daily. Must keep scheduled appt with cardiologist  in order to receive further refills.   pantoprazole (PROTONIX) 40 MG tablet TAKE 1 TABLET(40 MG) BY MOUTH DAILY   sacubitril-valsartan (ENTRESTO) 24-26 MG Take 1 tablet by mouth 2 (two) times daily. Must keep current appt with cardiologist for additional refills     Allergies:   Sulfonamide derivatives and Latex   Past Medical History:  Diagnosis Date   CHF (congestive heart failure) (HCC) 2002   with pregnancy   Headache    Presence of permanent cardiac pacemaker    off for 2 years   Seizures (HCC) 2002   due to eclampsia   Sinus bradycardia    Tetralogy of Fallot s/p repair 1975    Past Surgical History:  Procedure Laterality Date   ABDOMINAL HYSTERECTOMY N/A 08/12/2014   Procedure: HYSTERECTOMY ABDOMINAL;  Surgeon: Jeani Hawking, MD;  Location: [facility];  Service: Gynecology;  Laterality: N/A;   CESAREAN SECTION     LAPAROSCOPIC ASSISTED VAGINAL HYSTERECTOMY N/A  08/12/2014   Procedure: ATTEMPTED LAPAROSCOPIC ASSISTED VAGINAL HYSTERECTOMY;  Surgeon: Jeani Hawking, MD;  Location: [facility];  Service: Gynecology;  Laterality: N/A;   PACEMAKER INSERTION     [employer]    RIGHT/LEFT HEART CATH AND CORONARY ANGIOGRAPHY N/A 07/01/2020   Procedure: RIGHT/LEFT HEART CATH AND CORONARY ANGIOGRAPHY;  Surgeon: Corky Crafts, MD;  Location: [facility] INVASIVE CV LAB;  Service: Cardiovascular;  Laterality: N/A;   SALPINGOOPHORECTOMY Bilateral 08/12/2014   Procedure: SALPINGO OOPHORECTOMY;  Surgeon: Jeani Hawking, MD;  Location: [facility];  Service: Gynecology;  Laterality: Bilateral;   SVT ABLATION N/A 08/21/2020   Procedure: SVT ABLATION;  Surgeon: Marinus Maw, MD;  Location: [facility] INVASIVE CV LAB;  Service: Cardiovascular;  Laterality: N/A;   TETRALOGY OF FALLOT REPAIR     TONSILLECTOMY       Social History:  The patient  reports that she has quit smoking. She has never used smokeless tobacco. She reports current alcohol use. She reports that she does not use drugs.   Family  History:  The patient's family history is not on file.    ROS:  Please see the history of present illness. All other systems are reviewed and  Negative to the above problem except as noted.    PHYSICAL EXAM: VS:  BP 114/66   Pulse (!) 55   Ht 5\' 2"  (1.575 m)   Wt 216 lb 3.2 oz (98.1 kg)   LMP 11/20/2013   SpO2 96%   BMI 39.54 kg/m   GEN:  Obese 51 yo  in no acute distress  HEENT: normal  Neck: no JVD, carotid bruits Cardiac: RRR; no murmurs  No LE edema Respiratory:  clear to auscultation bilaterally, GI: soft, nontender, nondistended, + BS   MS: no deformity Moving all extremities   Skin: warm and dry, no rash Neuro:  Strength and sensation are intact Psych: euthymic mood, full affect   EKG:  EKG not done today        Echo  10/03/20  Left ventricular ejection fraction, by estimation, is 45 to 50%. The left ventricle has mildly decreased function. The left ventricle demonstrates global hypokinesis. Left ventricular diastolic parameters are indeterminate. There is the interventricular septum is flattened in diastole ('D' shaped left ventricle), consistent with right ventricular volume overload. 2. Right ventricular systolic function is moderately reduced. The right ventricular size is mildly enlarged. 3. Right atrial size was mildly dilated. 4. The mitral valve is normal in structure. Mild mitral valve regurgitation. No evidence of mitral stenosis. 5. The aortic valve is normal in structure. Aortic valve regurgitation is trivial. No aortic stenosis is present. 6. The inferior vena cava is normal in size with greater than 50% respiratory variability, suggesting right atrial pressure of 3 mmHg. Echo  06/02/20  1. Poor acoustic windows limiit. 2. Left ventricular ejection fraction, by estimation, is 35 to 40%. The left ventricle has moderately decreased function. The left ventricle demonstrates global hypokinesis. There is mild left ventricular hypertrophy. Left  ventricular diastolic parameters are consistent with Grade III diastolic dysfunction (restrictive). 3. Right ventricular systolic function moderate to severely reduced. The right ventricular size is moderately enlarged. 4. Left atrial size was moderately dilated. 5. Right atrial size was moderately dilated. 6. The mitral valve is normal in structure. Moderate mitral valve regurgitation. 7. The aortic valve is normal in structure. Aortic valve regurgitation is not visualized. 8. The inferior vena cava is normal in size with <50% respiratory variability, suggesting right atrial pressure of  8 mmHg.  Limited echo 10.12.21  Limited study. 2. Left ventricular ejection fraction, by estimation, is 35 to 40%. The left ventricle has moderately decreased function. The left ventricle demonstrates global hypokinesis. No obvious LV mural thrombus. 3. Right ventricular systolic function is moderately reduced. The right ventricular size is moderately enlarged. Mildly increased right ventricular wall thickness. 4. The inferior vena cava is normal in size with greater than 50% respiratory variability, suggesting right atrial pressure of 3 mmHg.  R and L heart cath 07/01/20 No left main. Separate ostia of LAD and circumflex. No angiographically apparent CAD. There is mild to moderate left ventricular systolic dysfunction. LV end diastolic pressure is normal. The left ventricular ejection fraction is 35-45% by visual estimate. There is no aortic valve stenosis. Ao 95%, PA 73%, PA pressure 21/6, mean PA pressure 12 mm Hg; PCWP 9 mm Hg; CO 5.9 L/min; CI 3.29 Normal right heart pressures.   Continue preventive therapy. Nonischemic cardiomyopathy.      Lipid Panel No results found for: "CHOL", "TRIG", "HDL", "CHOLHDL", "VLDL", "LDLCALC", "LDLDIRECT"    Wt Readings from Last 3 Encounters:  03/29/22 216 lb 3.2 oz (98.1 kg)  01/09/21 194 lb 6.4 oz (88.2 kg)  01/04/21 185 lb (83.9 kg)       ASSESSMENT AND PLAN:    1.  Chronic systolic CHF    Echo  in 2022 showed LVEF 45 to 50%  Mod RV dysfuncition    REcomm repeat to reassess function  Volume status again looks okay.   Keep on same regimen  2  TOF  Repair in 1975  Repeat echo      3  Hx SVT  s/p ablation by Rosette Reveal    4  Bradycardia   Will cut back on toprol to daily   Follow      4  Hx PPM   Device is nonfunctional   Pt did not show up for appt with EP      Will check lipomed, CBC, BMET. A1C      Current medicines are reviewed at length with the patient today.  The patient does not have concerns regarding medicines.  Signed, Dietrich Pates, MD  03/29/2022 11:43 AM    [facility] [address] [place], [place], [place]  [address] Phone: [phone]; Fax: [phone]

## 2022-03-29 ENCOUNTER — Ambulatory Visit (INDEPENDENT_AMBULATORY_CARE_PROVIDER_SITE_OTHER): Payer: 59 | Admitting: Internal Medicine

## 2022-03-29 ENCOUNTER — Encounter: Payer: Self-pay | Admitting: Internal Medicine

## 2022-03-29 VITALS — BP 114/66 | HR 55 | Ht 62.0 in | Wt 216.2 lb

## 2022-03-29 DIAGNOSIS — J9859 Other diseases of mediastinum, not elsewhere classified: Secondary | ICD-10-CM

## 2022-03-29 DIAGNOSIS — R079 Chest pain, unspecified: Secondary | ICD-10-CM | POA: Diagnosis not present

## 2022-03-29 DIAGNOSIS — R0602 Shortness of breath: Secondary | ICD-10-CM

## 2022-03-29 DIAGNOSIS — I471 Supraventricular tachycardia: Secondary | ICD-10-CM

## 2022-03-29 DIAGNOSIS — R739 Hyperglycemia, unspecified: Secondary | ICD-10-CM

## 2022-03-29 DIAGNOSIS — Z8774 Personal history of (corrected) congenital malformations of heart and circulatory system: Secondary | ICD-10-CM

## 2022-03-29 DIAGNOSIS — R002 Palpitations: Secondary | ICD-10-CM

## 2022-03-29 MED ORDER — PANTOPRAZOLE SODIUM 40 MG PO TBEC: DELAYED_RELEASE_TABLET | ORAL | 3 refills | Status: DC

## 2022-03-29 MED ORDER — METOPROLOL SUCCINATE ER 25 MG PO TB24: 25.0000 mg | ORAL_TABLET | Freq: Every day | ORAL | 3 refills | Status: DC

## 2022-03-29 MED ORDER — ENTRESTO 24-26 MG PO TABS: 1.0000 | ORAL_TABLET | Freq: Two times a day (BID) | ORAL | 3 refills | Status: DC

## 2022-03-29 NOTE — Patient Instructions (Signed)
Medication Instructions:  Decrease Toprol to once daily *If you need a refill on your cardiac medications before your next appointment, please call your pharmacy*   Lab Work: BMET, CBC, TSH, PRO BNP, NMR, APO A, LIPO A, HGBA1C If you have labs (blood work) drawn today and your tests are completely normal, you will receive your results only by: MyChart Message (if you have MyChart) OR A paper copy in the mail If you have any lab test that is abnormal or we need to change your treatment, we will call you to review the results.   Your physician has requested that you have an echocardiogram. Echocardiography is a painless test that uses sound waves to create images of your heart. It provides your doctor with information about the size and shape of your heart and how well your heart's chambers and valves are working. This procedure takes approximately one hour. There are no restrictions for this procedure.     Follow-Up: At [facility], you and your health needs are our priority.  As part of our continuing mission to provide you with exceptional heart care, we have created designated Provider Care Teams.  These Care Teams include your primary Cardiologist (physician) and Advanced Practice Providers (APPs -  Physician Assistants and Nurse Practitioners) who all work together to provide you with the care you need, when you need it.  We recommend signing up for the patient portal called "MyChart".  Sign up information is provided on this After Visit Summary.  MyChart is used to connect with patients for Virtual Visits (Telemedicine).  Patients are able to view lab/test results, encounter notes, upcoming appointments, etc.  Non-urgent messages can be sent to your provider as well.   To learn more about what you can do with MyChart, go to [email or web address].    Your next appointment:   8 month(s)  The format for your next appointment:   In Person  Provider:   Dietrich Pates, MD     Other  Instructions   Important Information About Sugar

## 2022-03-30 LAB — NMR, LIPOPROFILE
Cholesterol, Total: 248 mg/dL — ABNORMAL HIGH (ref 100–199)
HDL Particle Number: 30.4 umol/L — ABNORMAL LOW (ref >=30.5)
HDL-C: 59 mg/dL (ref >39)
LDL Particle Number: 1500 nmol/L — ABNORMAL HIGH (ref <1000)
LDL Size: 22 nm (ref >20.5)
LDL-C (NIH Calc): 142 mg/dL — ABNORMAL HIGH (ref 0–99)
LP-IR Score: <25 (ref <=45)
Small LDL Particle Number: 278 nmol/L (ref <=527)
Triglycerides: 260 mg/dL — ABNORMAL HIGH (ref 0–149)

## 2022-03-30 LAB — CBC
Hematocrit: 41.3 % (ref 34.0–46.6)
Hemoglobin: 13.6 g/dL (ref 11.1–15.9)
MCH: 28.7 pg (ref 26.6–33.0)
MCHC: 32.9 g/dL (ref 31.5–35.7)
MCV: 87 fL (ref 79–97)
Platelets: 298 10*3/uL (ref 150–450)
RBC: 4.74 x10E6/uL (ref 3.77–5.28)
RDW: 13.8 % (ref 11.7–15.4)
WBC: 8 10*3/uL (ref 3.4–10.8)

## 2022-03-30 LAB — BASIC METABOLIC PANEL
BUN/Creatinine Ratio: 19 (ref 9–23)
BUN: 17 mg/dL (ref 6–24)
CO2: 19 mmol/L — ABNORMAL LOW (ref 20–29)
Calcium: 9.8 mg/dL (ref 8.7–10.2)
Chloride: 102 mmol/L (ref 96–106)
Creatinine, Ser: 0.88 mg/dL (ref 0.57–1.00)
Glucose: 83 mg/dL (ref 70–99)
Potassium: 4.7 mmol/L (ref 3.5–5.2)
Sodium: 141 mmol/L (ref 134–144)
eGFR: 80 mL/min/{1.73_m2} (ref >59)

## 2022-03-30 LAB — HEMOGLOBIN A1C
Est. average glucose Bld gHb Est-mCnc: 120 mg/dL
Hgb A1c MFr Bld: 5.8 % — ABNORMAL HIGH (ref 4.8–5.6)

## 2022-03-30 LAB — PRO B NATRIURETIC PEPTIDE: NT-Pro BNP: 156 pg/mL (ref 0–249)

## 2022-03-30 LAB — APOLIPOPROTEIN B: Apolipoprotein B: 107 mg/dL — ABNORMAL HIGH (ref <90)

## 2022-03-30 LAB — TSH: TSH: 1.71 u[IU]/mL (ref 0.450–4.500)

## 2022-03-30 LAB — LIPOPROTEIN A (LPA): Lipoprotein (a): 178.8 nmol/L — ABNORMAL HIGH (ref <75.0)

## 2022-03-31 ENCOUNTER — Telehealth: Payer: Self-pay

## 2022-03-31 DIAGNOSIS — Z79899 Other long term (current) drug therapy: Secondary | ICD-10-CM

## 2022-03-31 DIAGNOSIS — E785 Hyperlipidemia, unspecified: Secondary | ICD-10-CM

## 2022-03-31 MED ORDER — ROSUVASTATIN CALCIUM 10 MG PO TABS: 10.0000 mg | ORAL_TABLET | Freq: Every day | ORAL | 3 refills | Status: DC

## 2022-03-31 NOTE — Telephone Encounter (Signed)
The patient has been notified of the result and verbalized understanding.  All questions (if any) were answered. Bertram Millard, RN 03/31/2022 4:21 PM

## 2022-03-31 NOTE — Telephone Encounter (Signed)
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   I would reocmm Crestor 10 mg   F/U lipomed and liver panel in 10 wks

## 2022-04-16 ENCOUNTER — Ambulatory Visit (HOSPITAL_COMMUNITY): Payer: 59 | Attending: Cardiology

## 2022-04-16 DIAGNOSIS — J9859 Other diseases of mediastinum, not elsewhere classified: Secondary | ICD-10-CM | POA: Diagnosis present

## 2022-04-16 DIAGNOSIS — R002 Palpitations: Secondary | ICD-10-CM | POA: Insufficient documentation

## 2022-04-16 DIAGNOSIS — Z8774 Personal history of (corrected) congenital malformations of heart and circulatory system: Secondary | ICD-10-CM | POA: Diagnosis present

## 2022-04-16 DIAGNOSIS — R0602 Shortness of breath: Secondary | ICD-10-CM | POA: Diagnosis present

## 2022-04-16 DIAGNOSIS — R739 Hyperglycemia, unspecified: Secondary | ICD-10-CM | POA: Insufficient documentation

## 2022-04-16 DIAGNOSIS — I471 Supraventricular tachycardia: Secondary | ICD-10-CM | POA: Diagnosis present

## 2022-04-16 DIAGNOSIS — R079 Chest pain, unspecified: Secondary | ICD-10-CM | POA: Diagnosis present

## 2022-04-16 LAB — ECHOCARDIOGRAM COMPLETE
Area-P 1/2: 4.1 cm2
Calc EF: 52 %
S' Lateral: 3.1 cm
Single Plane A2C EF: 51.2 %
Single Plane A4C EF: 50.3 %

## 2022-04-27 ENCOUNTER — Encounter: Payer: Self-pay | Admitting: Internal Medicine

## 2022-05-05 ENCOUNTER — Telehealth: Payer: Self-pay | Admitting: Internal Medicine

## 2022-05-05 DIAGNOSIS — Z8679 Personal history of other diseases of the circulatory system: Secondary | ICD-10-CM

## 2022-05-05 DIAGNOSIS — Z8774 Personal history of (corrected) congenital malformations of heart and circulatory system: Secondary | ICD-10-CM

## 2022-05-05 NOTE — Telephone Encounter (Signed)
Pt is calling in regards to possible MRI that Dr. Tenny Craw had talked to her about previously. Requesting call back.

## 2022-05-05 NOTE — Telephone Encounter (Signed)
Patient wants to know if she will be able to proceed with having an MRI with her pacemaker. If not what are the next steps. Will forward to Dr. Tenny Craw for review.

## 2022-05-06 NOTE — Telephone Encounter (Signed)
Left a message for the pt to call back.  

## 2022-05-06 NOTE — Telephone Encounter (Signed)
Reviewed with EP   Given the pacer that she has, unfortunaltey can't do MRI I would recomm that we continue to keep a close follow up of RV, pulmoniic valve with dedicated echoes I would reocmm follow up echo next summer   July   See if can be put in for [facility] when done

## 2022-05-06 NOTE — Addendum Note (Signed)
Addended by: Bertram Millard on: 05/06/2022 12:41 PM   Modules accepted: Orders

## 2022-05-06 NOTE — Telephone Encounter (Signed)
Pt advised Dr Tenny Craw' recommendations.... order placed for 02/2023.

## 2022-05-11 IMAGING — DX DG CHEST 1V PORT
1 series · 1 of 1 positions shown · non-contrast
Comparison: Radiograph 06/23/2013

CLINICAL DATA: Palpitations, diaphoresis and dizziness

EXAM:
PORTABLE CHEST 1 VIEW

[chest ap]
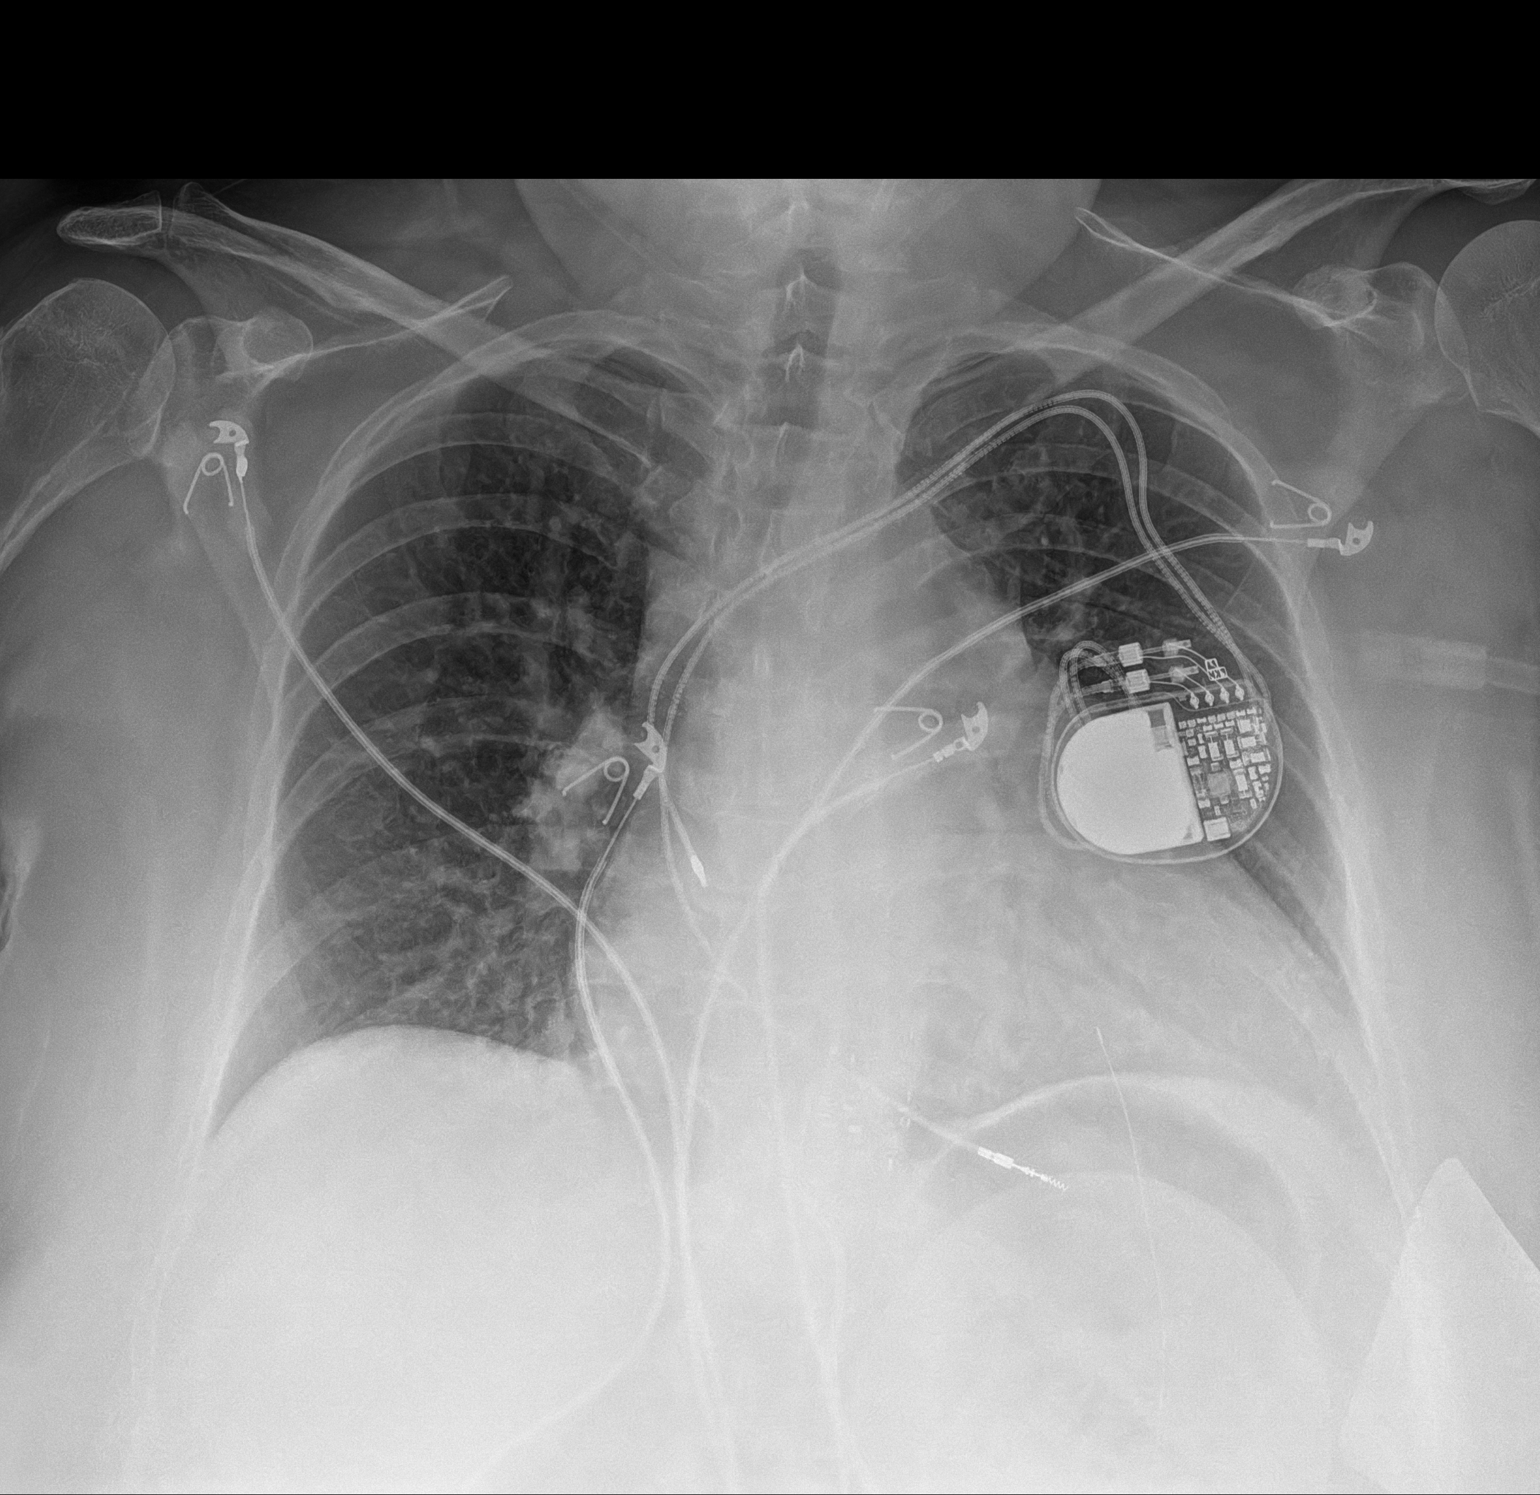

[1 of 1 positions shown; findings below may reference images not displayed]

FINDINGS: Cardiomegaly is likely similar to prior counting for differences in
technique with a pacer pack overlying the left chest wall leads in
stable position at the cardiac apex and right atrium. External
support devices, pacer pads and telemetry leads overlie the chest.

Low volumes with streaky opacities favoring atelectasis with some
mild vascular congestion without features of frank edema. No focal
consolidation. No pneumothorax or visible effusion.

No acute osseous or soft tissue abnormality.
IMPRESSION: 1. Low volumes with streaky opacities favoring atelectasis.
2. Cardiomegaly with mild vascular congestion but no features of
frank edema.

## 2022-06-02 IMAGING — CT CT ANGIO CHEST
2 of 10 series · 19 of 46 positions shown · IV contrast (OMNIPAQUE 350)
Comparison: None.

CLINICAL DATA: Shortness of breath.

EXAM:
CT ANGIOGRAPHY CHEST WITH CONTRAST
TECHNIQUE: Multidetector CT imaging of the chest was performed using the
standard protocol during bolus administration of intravenous
contrast. Multiplanar CT image reconstructions and MIPs were
obtained to evaluate the vascular anatomy.
CONTRAST:  80mL OMNIPAQUE IOHEXOL 350 MG/ML SOLN

[Series 7: thins · axial · 0.66mm/px · z∈[-186,+40]mm · 16 of 258 slices shown]
[im 16/258  lung]
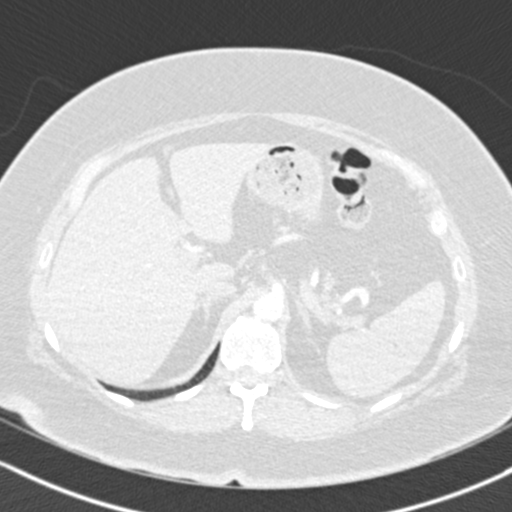
[im 31/258  soft-tissue]
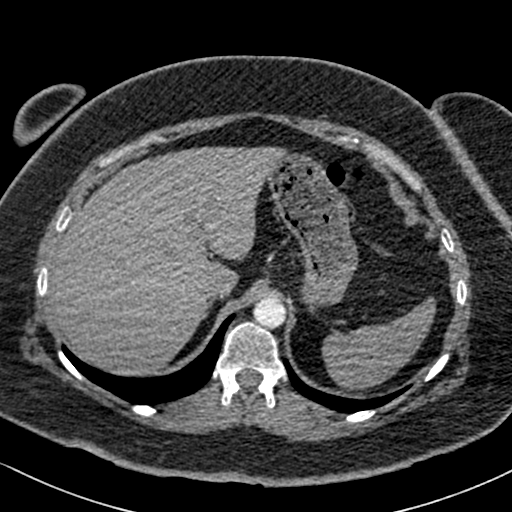
[im 46/258  lung]
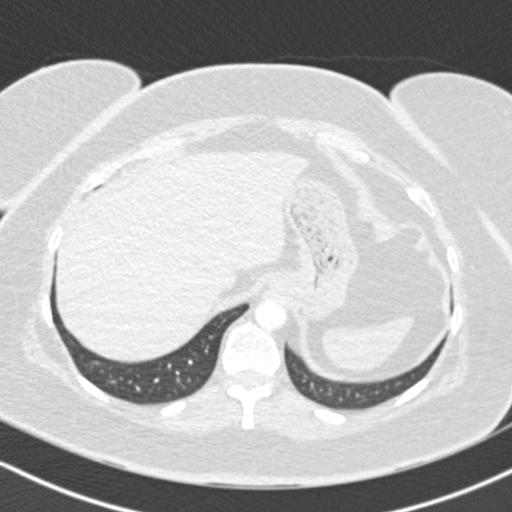
[im 61/258  soft-tissue]
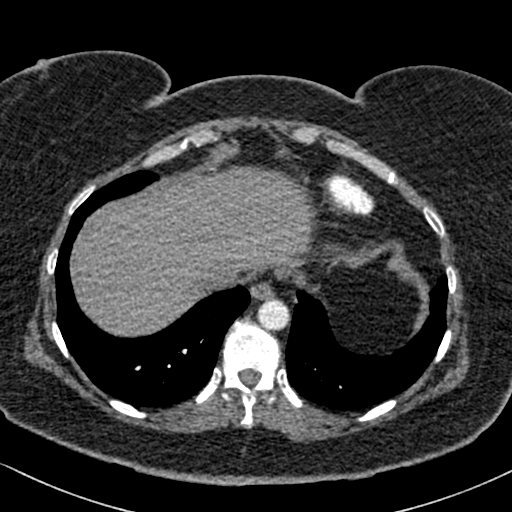
[im 76/258  lung]
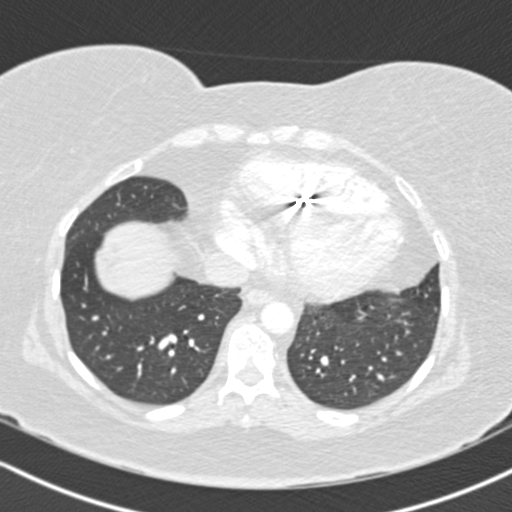
[im 91/258  soft-tissue]
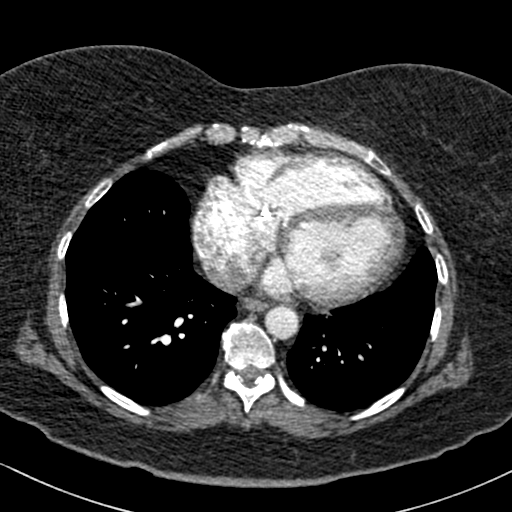
[im 106/258  lung]
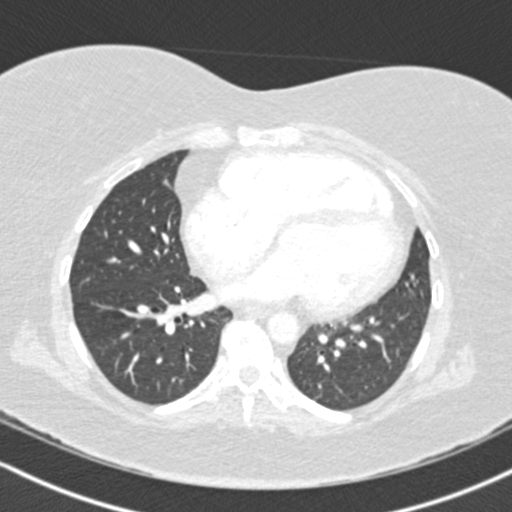
[im 121/258  soft-tissue]
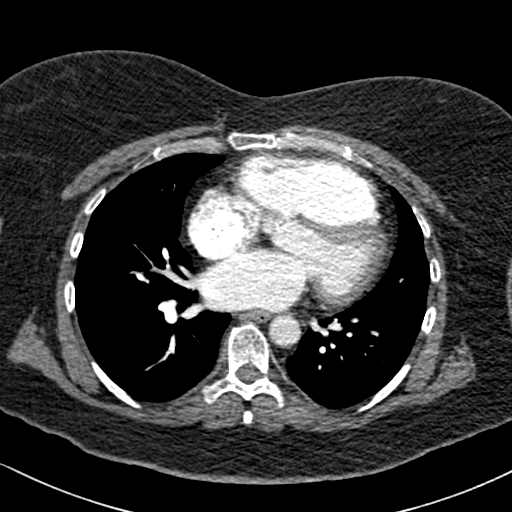
[im 137/258  lung]
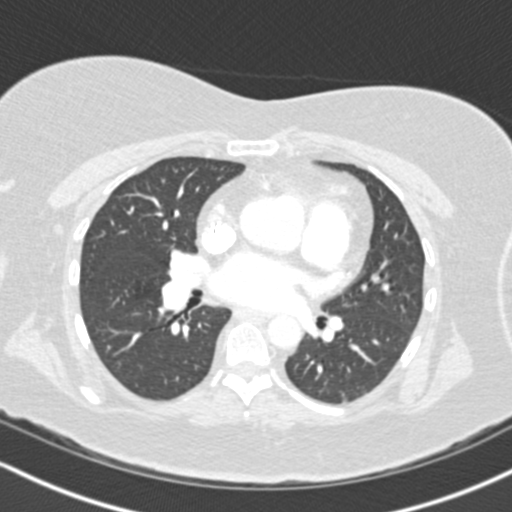
[im 152/258  soft-tissue]
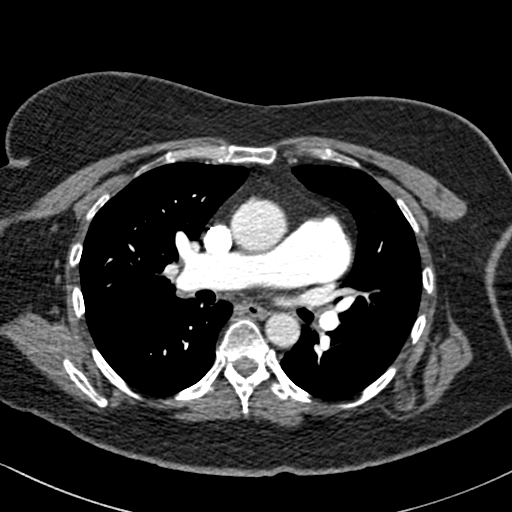
[im 167/258  lung]
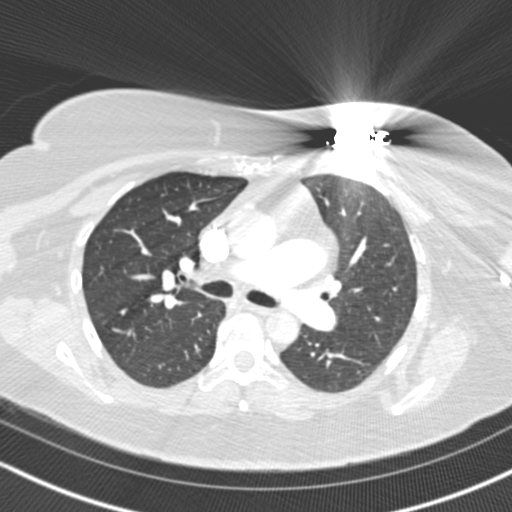
[im 182/258  soft-tissue]
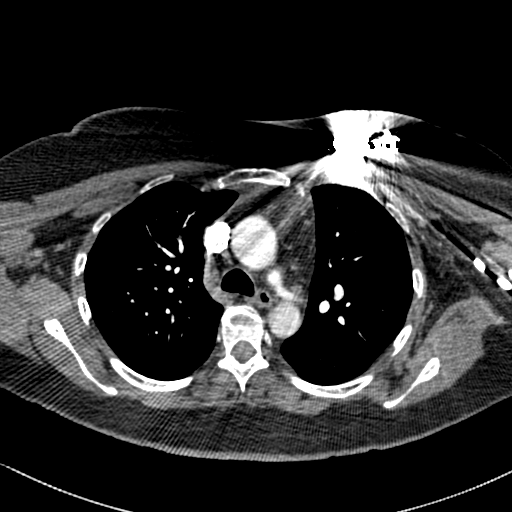
[im 197/258  lung]
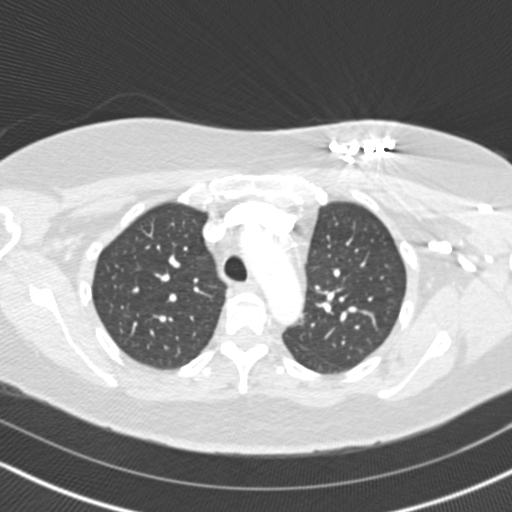
[im 212/258  soft-tissue]
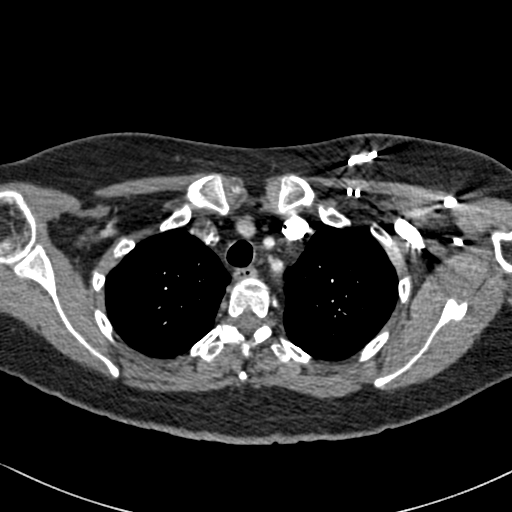
[im 227/258  lung]
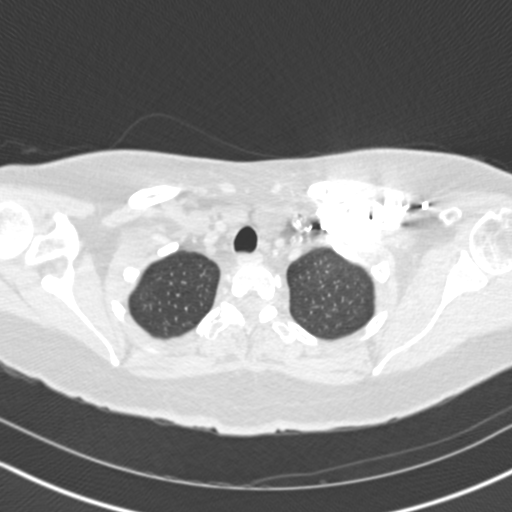
[im 242/258  soft-tissue]
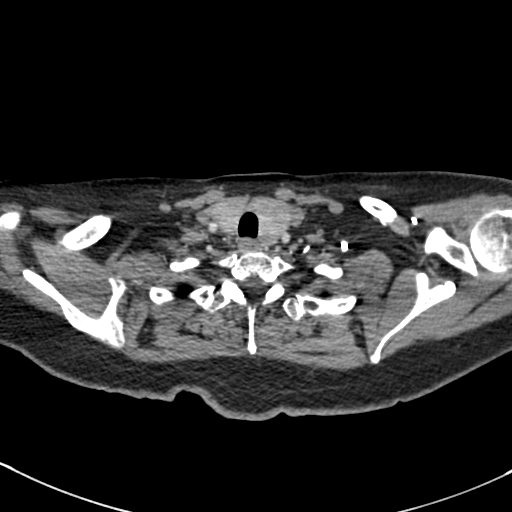

[Series 9: coronal mpr · coronal · 0.50mm/px · 3 of 125 slices shown]
[im 32/125  soft-tissue]
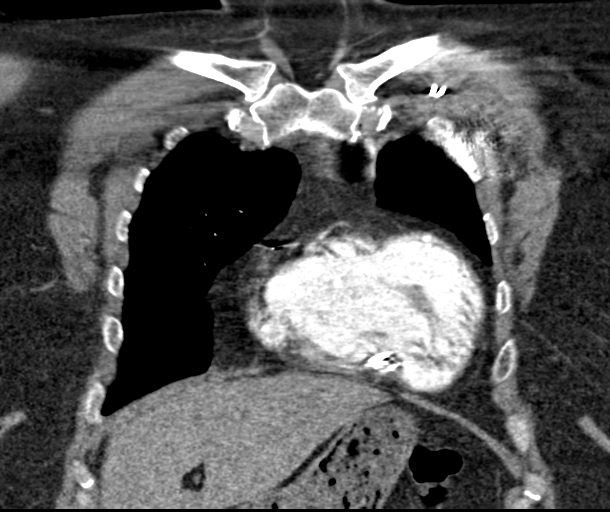
[im 63/125  soft-tissue]
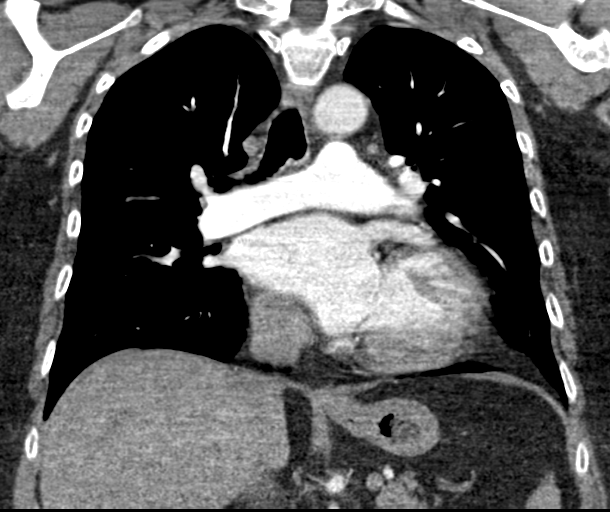
[im 94/125  soft-tissue]
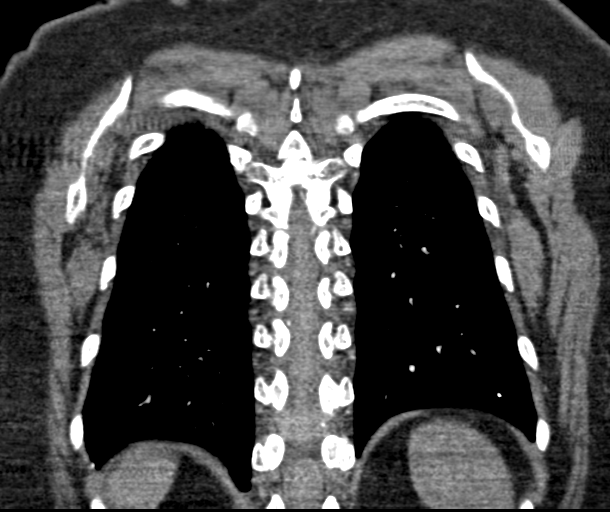

[19 of 46 positions shown; findings below may reference images not displayed]

FINDINGS: Cardiovascular: Satisfactory opacification of the pulmonary arteries
to the segmental level. No evidence of pulmonary embolism. Mild
cardiomegaly is noted. No pericardial effusion.

Mediastinum/Nodes: No enlarged mediastinal, hilar, or axillary lymph
nodes. Thyroid gland, trachea, and esophagus demonstrate no
significant findings.

Lungs/Pleura: Lungs are clear. No pleural effusion or pneumothorax.

Upper Abdomen: No acute abnormality.

Musculoskeletal: No chest wall abnormality. No acute or significant
osseous findings.

Review of the MIP images confirms the above findings.
IMPRESSION: No definite evidence of pulmonary embolus. Mild cardiomegaly.

## 2022-06-16 ENCOUNTER — Other Ambulatory Visit: Payer: 59

## 2022-10-20 ENCOUNTER — Encounter: Payer: Self-pay | Admitting: Internal Medicine

## 2022-10-20 NOTE — Telephone Encounter (Signed)
Take the succinate at dinner

## 2022-10-26 ENCOUNTER — Ambulatory Visit: Payer: Managed Care, Other (non HMO) | Attending: Internal Medicine

## 2022-10-26 DIAGNOSIS — E785 Hyperlipidemia, unspecified: Secondary | ICD-10-CM

## 2022-10-26 DIAGNOSIS — Z79899 Other long term (current) drug therapy: Secondary | ICD-10-CM

## 2022-10-29 LAB — NMR, LIPOPROFILE
Cholesterol, Total: 148 mg/dL (ref 100–199)
HDL Particle Number: 34.1 umol/L (ref >=30.5)
HDL-C: 67 mg/dL (ref >39)
LDL Particle Number: 613 nmol/L (ref <1000)
LDL Size: 20.8 nm (ref >20.5)
LDL-C (NIH Calc): 62 mg/dL (ref 0–99)
LP-IR Score: 33 (ref <=45)
Small LDL Particle Number: 247 nmol/L (ref <=527)
Triglycerides: 103 mg/dL (ref 0–149)

## 2022-10-29 LAB — HEPATIC FUNCTION PANEL
ALT: 19 IU/L (ref 0–32)
AST: 18 IU/L (ref 0–40)
Albumin: 4.3 g/dL (ref 3.8–4.9)
Alkaline Phosphatase: 70 IU/L (ref 44–121)
Bilirubin Total: 0.3 mg/dL (ref 0.0–1.2)
Bilirubin, Direct: <0.1 mg/dL (ref 0.00–0.40)
Total Protein: 6.6 g/dL (ref 6.0–8.5)

## 2022-11-26 ENCOUNTER — Encounter: Payer: Self-pay | Admitting: Internal Medicine

## 2022-11-30 NOTE — Telephone Encounter (Signed)
I spoke with the pt to see how she is doing... her father passed away on Aug 21, 2023 and they just had the memorial... she reports being under a lot of stress and anxiety and has not been sleeping.   She has been having some chest tightness mostly at rest and some "fluttering" in her chest.   She denies SOB, dizziness, no radiation of the tightness.   She says some of the stress has recently let up and her symptoms have improved some but has not gone away.   I have advised her that I will work on getting her in this week with Dr Tenny Craw... but until she hears back that if her symptoms worsen or change then she needs to consider more urgent care in the ED.   She will talk with her PCP about her anxiety.

## 2022-12-01 NOTE — Progress Notes (Addendum)
Cardiology Office Note   Date:  12/02/2022   ID:  Tonya Gardner, DOB [birth year], MRN [id number]  PCP:  Oneita Hurt, No  Cardiologist:   Dietrich Pates, MD  EP:   Lewayne Bunting, MD  Pt presents for follow up evaluation.     History of Present Illness: Tonya Gardner is a 52 y.o. female with a history of Tetralogy of Fallot (s/p repair in 1975), hx severe bradycardia (2002, in setting of preeclampsia/eclampsia; s/p PPM; battery at end of life 2012; pt has declined extraction).  In 2021 the pt complained of SOB   Echo LVEF 35 to 40%  RVEF modeately reduced   SHe underwent  R and L heart catheterization in Nov 2021.   This showed normal Pressures, normal coronary arteries      She was placed on Entresto at that time   In Dec 2021 he pt underwent ablation of atrial tach x 2 and atrial flutter      ON review of repeat echo in Feb  2022 LVEF 45 to 50%, RVEF mild to moderately depressed.  PI appeared at least moderate  I saw the pt in Aug  2023 At that time she was recovering from a URI   She also was very busy with new job, going through a divorce. SHe complained of some dizziness.   Repeat echo LVEF appeared normal, RVEF mildly depressed; PI severe Unfort with PPM not able to do MRI.   Plan was to continue to follow closely Since seen, the pt continues to complain not feeling good   Fatigued, gives out   Denies CP   No presyncope/syncope    BP is low at home        Current Meds  Medication Sig   ibuprofen (ADVIL) 200 MG tablet Take 400 mg by mouth every 8 (eight) hours as needed for mild pain or moderate pain (pain.).   levocetirizine (XYZAL) 5 MG tablet Take by mouth daily at 6 (six) AM.   metoprolol succinate (TOPROL-XL) 25 MG 24 hr tablet Take 1 tablet (25 mg total) by mouth daily.   pantoprazole (PROTONIX) 40 MG tablet TAKE 1 TABLET(40 MG) BY MOUTH DAILY   rosuvastatin (CRESTOR) 10 MG tablet Take 1 tablet (10 mg total) by mouth daily.   sacubitril-valsartan (ENTRESTO) 24-26 MG Take 1 tablet by mouth  2 (two) times daily.     Allergies:   Sulfonamide derivatives and Latex   Past Medical History:  Diagnosis Date   CHF (congestive heart failure) 2002   with pregnancy   Headache    Presence of permanent cardiac pacemaker    off for 2 years   Seizures 2002   due to eclampsia   Sinus bradycardia    Tetralogy of Fallot s/p repair 1975    Past Surgical History:  Procedure Laterality Date   ABDOMINAL HYSTERECTOMY N/A 08/12/2014   Procedure: HYSTERECTOMY ABDOMINAL;  Surgeon: Jeani Hawking, MD;  Location: [facility];  Service: Gynecology;  Laterality: N/A;   CESAREAN SECTION     LAPAROSCOPIC ASSISTED VAGINAL HYSTERECTOMY N/A 08/12/2014   Procedure: ATTEMPTED LAPAROSCOPIC ASSISTED VAGINAL HYSTERECTOMY;  Surgeon: Jeani Hawking, MD;  Location: [facility];  Service: Gynecology;  Laterality: N/A;   PACEMAKER INSERTION     [employer]    RIGHT/LEFT HEART CATH AND CORONARY ANGIOGRAPHY N/A 07/01/2020   Procedure: RIGHT/LEFT HEART CATH AND CORONARY ANGIOGRAPHY;  Surgeon: Corky Crafts, MD;  Location: [facility];  Service: Cardiovascular;  Laterality: N/A;  SALPINGOOPHORECTOMY Bilateral 08/12/2014   Procedure: SALPINGO OOPHORECTOMY;  Surgeon: Jeani Hawking, MD;  Location: [facility];  Service: Gynecology;  Laterality: Bilateral;   SVT ABLATION N/A 08/21/2020   Procedure: SVT ABLATION;  Surgeon: Marinus Maw, MD;  Location: [facility] INVASIVE CV LAB;  Service: Cardiovascular;  Laterality: N/A;   TETRALOGY OF FALLOT REPAIR     TONSILLECTOMY       Social History:  The patient  reports that she has quit smoking. She has never used smokeless tobacco. She reports current alcohol use. She reports that she does not use drugs.   Family History:  The patient's family history is not on file.    ROS:  Please see the history of present illness. All other systems are reviewed and  Negative to the above problem except as noted.    PHYSICAL EXAM: VS:  BP 104/70   Pulse (!) 52   Ht 5\' 2"   (1.575 m)   Wt 212 lb (96.2 kg)   LMP 11/20/2013   SpO2 96%   BMI 38.78 kg/m   GEN:  Obese 52 yo  in no acute distress  HEENT: normal  Neck: no JVD, carotid bruit Cardiac: RRR; no significant murmurs   No LE edema Respiratory:  clear to auscultation GI: soft, nontender, no hepatomegaly  + BS      EKG:  EKG shows SB 52 bpm   RBBB  QRS 142 msec     Echo AUg 2023  1. S/P repair of tetralogy of fallot; septal hypokinesis with preserved  LV function; D shaped septum; moderate RVE with mild RV dysfunction at  apex; mild RAE; mild PS; severe PI.   2. Left ventricular ejection fraction, by estimation, is 55 to 60%. The  left ventricle has normal function. The left ventricle has no regional  wall motion abnormalities. Left ventricular diastolic parameters were  normal. There is the interventricular  septum is flattened in systole and diastole, consistent with right  ventricular pressure and volume overload.   3. Right ventricular systolic function is mildly reduced. The right  ventricular size is moderately enlarged. There is normal pulmonary artery  systolic pressure.   4. Right atrial size was mildly dilated.   5. The mitral valve is normal in structure. No evidence of mitral valve  regurgitation. No evidence of mitral stenosis.   6. The aortic valve is normal in structure. Aortic valve regurgitation is  trivial. No aortic stenosis is present.   7. Pulmonic valve regurgitation is severe. Mild pulmonic stenosis.   8. The inferior vena cava is normal in size with greater than 50%  respiratory variability, suggesting right atrial pressure of 3 mmHg.  Echo  10/03/20  Left ventricular ejection fraction, by estimation, is 45 to 50%. The left ventricle has mildly decreased function. The left ventricle demonstrates global hypokinesis. Left ventricular diastolic parameters are indeterminate. There is the interventricular septum is flattened in diastole ('D' shaped left ventricle),  consistent with right ventricular volume overload. 2. Right ventricular systolic function is moderately reduced. The right ventricular size is mildly enlarged. 3. Right atrial size was mildly dilated. 4. The mitral valve is normal in structure. Mild mitral valve regurgitation. No evidence of mitral stenosis. 5. The aortic valve is normal in structure. Aortic valve regurgitation is trivial. No aortic stenosis is present. 6. The inferior vena cava is normal in size with greater than 50% respiratory variability, suggesting right atrial pressure of 3 mmHg. Echo  06/02/20  1. Poor acoustic windows limiit.  2. Left ventricular ejection fraction, by estimation, is 35 to 40%. The left ventricle has moderately decreased function. The left ventricle demonstrates global hypokinesis. There is mild left ventricular hypertrophy. Left ventricular diastolic parameters are consistent with Grade III diastolic dysfunction (restrictive). 3. Right ventricular systolic function moderate to severely reduced. The right ventricular size is moderately enlarged. 4. Left atrial size was moderately dilated. 5. Right atrial size was moderately dilated. 6. The mitral valve is normal in structure. Moderate mitral valve regurgitation. 7. The aortic valve is normal in structure. Aortic valve regurgitation is not visualized. 8. The inferior vena cava is normal in size with <50% respiratory variability, suggesting right atrial pressure of 8 mmHg.  Limited echo 10.12.21  Limited study. 2. Left ventricular ejection fraction, by estimation, is 35 to 40%. The left ventricle has moderately decreased function. The left ventricle demonstrates global hypokinesis. No obvious LV mural thrombus. 3. Right ventricular systolic function is moderately reduced. The right ventricular size is moderately enlarged. Mildly increased right ventricular wall thickness. 4. The inferior vena cava is normal in size with greater than 50%  respiratory variability, suggesting right atrial pressure of 3 mmHg.  R and L heart cath 07/01/20 No left main. Separate ostia of LAD and circumflex. No angiographically apparent CAD. There is mild to moderate left ventricular systolic dysfunction. LV end diastolic pressure is normal. The left ventricular ejection fraction is 35-45% by visual estimate. There is no aortic valve stenosis. Ao 95%, PA 73%, PA pressure 21/6, mean PA pressure 12 mm Hg; PCWP 9 mm Hg; CO 5.9 L/min; CI 3.29 Normal right heart pressures.   Continue preventive therapy. Nonischemic cardiomyopathy.      Lipid Panel No results found for: "CHOL", "TRIG", "HDL", "CHOLHDL", "VLDL", "LDLCALC", "LDLDIRECT"    Wt Readings from Last 3 Encounters:  12/02/22 212 lb (96.2 kg)  03/29/22 216 lb 3.2 oz (98.1 kg)  01/09/21 194 lb 6.4 oz (88.2 kg)      ASSESSMENT AND PLAN:   1  Fatigue   PT with fatigue  Volume status looks OK  BP is a little low   Hx of HFrEF in past    Will get labs (CBC, TSH)    Set up for repeat echo    Since BP is a little low would cut back on Toprol XL to 12.5 mg   Stop Entresto for now    I have asked pt to call back with response   2  Hx HFrEF LVEF improved on last echo   Given symtpoms will repeat echo   2  TOF  Repair in 1975  Repeat echo      3  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     4  Hx of bradycardia, s/p PPM which is nonfunctional   Will get moniotr to evaluate ranges given fatigue    Follow up based on test results     Current medicines are reviewed at length with the patient today.  The patient does not have concerns regarding medicines.  Signed, Dietrich Pates, MD  12/02/2022 11:19 AM    [facility] [address] [place], [place], [place]  [address] Phone: [phone]; Fax: [phone]

## 2022-12-01 NOTE — Telephone Encounter (Signed)
Still hoping to get the pt in with Dr Tenny Craw 12/02/22 if cancellation... if not .Marland Kitchen Pt agrees to see the DOD Dr Graciela Husbands on Friday... Pt says she is feeling about the same not worsening but not much improvement. Will go to ED if anything changes.

## 2022-12-02 ENCOUNTER — Ambulatory Visit: Payer: 59 | Attending: Internal Medicine | Admitting: Internal Medicine

## 2022-12-02 ENCOUNTER — Ambulatory Visit: Payer: 59 | Attending: Internal Medicine

## 2022-12-02 ENCOUNTER — Encounter: Payer: Self-pay | Admitting: Internal Medicine

## 2022-12-02 VITALS — BP 104/70 | HR 52 | Ht 62.0 in | Wt 212.0 lb

## 2022-12-02 DIAGNOSIS — I471 Supraventricular tachycardia, unspecified: Secondary | ICD-10-CM

## 2022-12-02 DIAGNOSIS — Z8679 Personal history of other diseases of the circulatory system: Secondary | ICD-10-CM

## 2022-12-02 DIAGNOSIS — R002 Palpitations: Secondary | ICD-10-CM

## 2022-12-02 DIAGNOSIS — E785 Hyperlipidemia, unspecified: Secondary | ICD-10-CM

## 2022-12-02 DIAGNOSIS — R739 Hyperglycemia, unspecified: Secondary | ICD-10-CM

## 2022-12-02 DIAGNOSIS — Z79899 Other long term (current) drug therapy: Secondary | ICD-10-CM

## 2022-12-02 DIAGNOSIS — R0602 Shortness of breath: Secondary | ICD-10-CM

## 2022-12-02 DIAGNOSIS — R079 Chest pain, unspecified: Secondary | ICD-10-CM

## 2022-12-02 MED ORDER — METOPROLOL SUCCINATE ER 25 MG PO TB24: 12.5000 mg | ORAL_TABLET | Freq: Every day | ORAL | 3 refills | Status: DC

## 2022-12-02 NOTE — Patient Instructions (Addendum)
Medication Instructions:  DECREASE TOPROL TO 1/2 HOLD ENTRESTO FOR NOW  *If you need a refill on your cardiac medications before your next appointment, please call your pharmacy*   Lab Work: CBC, TSH  If you have labs (blood work) drawn today and your tests are completely normal, you will receive your results only by: MyChart Message (if you have MyChart) OR A paper copy in the mail If you have any lab test that is abnormal or we need to change your treatment, we will call you to review the results.   Testing/Procedures: Your physician has requested that you have an echocardiogram. Echocardiography is a painless test that uses sound waves to create images of your heart. It provides your doctor with information about the size and shape of your heart and how well your heart's chambers and valves are working. This procedure takes approximately one hour. There are no restrictions for this procedure. Please do NOT wear cologne, perfume, aftershave, or lotions (deodorant is allowed). Please arrive 15 minutes prior to your appointment time.   ZIO XT- Long Term Monitor Instructions  Your physician has requested you wear a ZIO patch monitor for 14 days.  This is a single patch monitor. [employer] supplies one patch monitor per enrollment. Additional stickers are not available. Please do not apply patch if you will be having a Nuclear Stress Test,  Echocardiogram, Cardiac CT, MRI, or Chest Xray during the period you would be wearing the  monitor. The patch cannot be worn during these tests. You cannot remove and re-apply the  ZIO XT patch monitor.  Your ZIO patch monitor will be mailed 3 day USPS to your address on file. It may take 3-5 days  to receive your monitor after you have been enrolled.  Once you have received your monitor, please review the enclosed instructions. Your monitor  has already been registered assigning a specific monitor serial # to you.  Billing and Patient Assistance  Program Information  We have supplied [employer] with any of your insurance information on file for billing purposes. [employer] offers a sliding scale Patient Assistance Program for patients that do not have  insurance, or whose insurance does not completely cover the cost of the ZIO monitor.  You must apply for the Patient Assistance Program to qualify for this discounted rate.  To apply, please call [employer] at [phone], select option 4, select option 2, ask to apply for  Patient Assistance Program. Meredeth Ide will ask your household income, and how many people  are in your household. They will quote your out-of-pocket cost based on that information.  [employer] will also be able to set up a 68-month, interest-free payment plan if needed.  Applying the monitor   Shave hair from upper left chest.  Hold abrader disc by orange tab. Rub abrader in 40 strokes over the upper left chest as  indicated in your monitor instructions.  Clean area with 4 enclosed alcohol pads. Let dry.  Apply patch as indicated in monitor instructions. Patch will be placed under collarbone on left  side of chest with arrow pointing upward.  Rub patch adhesive wings for 2 minutes. Remove white label marked "1". Remove the white  label marked "2". Rub patch adhesive wings for 2 additional minutes.  While looking in a mirror, press and release button in center of patch. A small green light will  flash 3-4 times. This will be your only indicator that the monitor has been turned on.  Do not shower for the  first 24 hours. You may shower after the first 24 hours.  Press the button if you feel a symptom. You will hear a small click. Record Date, Time and  Symptom in the Patient Logbook.  When you are ready to remove the patch, follow instructions on the last 2 pages of Patient  Logbook. Stick patch monitor onto the last page of Patient Logbook.  Place Patient Logbook in the blue and white box. Use locking tab on box and tape box  closed  securely. The blue and white box has prepaid postage on it. Please place it in the mailbox as  soon as possible. Your physician should have your test results approximately 7 days after the  monitor has been mailed back to [facility].  Call [facility] Customer Care at [phone] if you have questions regarding  your ZIO XT patch monitor. Call them immediately if you see an orange light blinking on your  monitor.  If your monitor falls off in less than 4 days, contact our Monitor department at [phone].  If your monitor becomes loose or falls off after 4 days call [employer] at [phone] for  suggestions on securing your monitor    Follow-Up: At [facility], you and your health needs are our priority.  As part of our continuing mission to provide you with exceptional heart care, we have created designated Provider Care Teams.  These Care Teams include your primary Cardiologist (physician) and Advanced Practice Providers (APPs -  Physician Assistants and Nurse Practitioners) who all work together to provide you with the care you need, when you need it.  We recommend signing up for the patient portal called "MyChart".  Sign up information is provided on this After Visit Summary.  MyChart is used to connect with patients for Virtual Visits (Telemedicine).  Patients are able to view lab/test results, encounter notes, upcoming appointments, etc.  Non-urgent messages can be sent to your provider as well.   To learn more about what you can do with MyChart, go to [email or web address].

## 2022-12-02 NOTE — Progress Notes (Unsigned)
Enrolled patient for a 14 day Zio XT  monitor to be mailed to patients home  °

## 2022-12-03 ENCOUNTER — Ambulatory Visit: Payer: 59 | Admitting: Internal Medicine

## 2022-12-03 LAB — CBC
Hematocrit: 38.5 % (ref 34.0–46.6)
Hemoglobin: 12.8 g/dL (ref 11.1–15.9)
MCH: 28.8 pg (ref 26.6–33.0)
MCHC: 33.2 g/dL (ref 31.5–35.7)
MCV: 87 fL (ref 79–97)
Platelets: 246 10*3/uL (ref 150–450)
RBC: 4.45 x10E6/uL (ref 3.77–5.28)
RDW: 13.5 % (ref 11.7–15.4)
WBC: 6.1 10*3/uL (ref 3.4–10.8)

## 2022-12-03 LAB — TSH: TSH: 1.22 u[IU]/mL (ref 0.450–4.500)

## 2022-12-04 NOTE — Telephone Encounter (Signed)
Lets get testing done first before adding Rx for sleep

## 2022-12-06 DIAGNOSIS — Z8679 Personal history of other diseases of the circulatory system: Secondary | ICD-10-CM | POA: Diagnosis not present

## 2022-12-06 DIAGNOSIS — R0602 Shortness of breath: Secondary | ICD-10-CM

## 2022-12-06 DIAGNOSIS — Z79899 Other long term (current) drug therapy: Secondary | ICD-10-CM

## 2022-12-06 DIAGNOSIS — I471 Supraventricular tachycardia, unspecified: Secondary | ICD-10-CM

## 2022-12-06 DIAGNOSIS — R002 Palpitations: Secondary | ICD-10-CM

## 2022-12-06 DIAGNOSIS — E785 Hyperlipidemia, unspecified: Secondary | ICD-10-CM | POA: Diagnosis not present

## 2022-12-06 DIAGNOSIS — R739 Hyperglycemia, unspecified: Secondary | ICD-10-CM

## 2022-12-06 DIAGNOSIS — R079 Chest pain, unspecified: Secondary | ICD-10-CM | POA: Diagnosis not present

## 2022-12-07 ENCOUNTER — Encounter: Payer: Self-pay | Admitting: Internal Medicine

## 2022-12-10 ENCOUNTER — Ambulatory Visit: Payer: 59 | Admitting: Internal Medicine

## 2022-12-31 ENCOUNTER — Ambulatory Visit (HOSPITAL_COMMUNITY): Payer: Managed Care, Other (non HMO) | Attending: Internal Medicine

## 2022-12-31 DIAGNOSIS — R002 Palpitations: Secondary | ICD-10-CM | POA: Insufficient documentation

## 2022-12-31 DIAGNOSIS — E785 Hyperlipidemia, unspecified: Secondary | ICD-10-CM | POA: Diagnosis present

## 2022-12-31 DIAGNOSIS — R0602 Shortness of breath: Secondary | ICD-10-CM | POA: Diagnosis present

## 2022-12-31 DIAGNOSIS — R079 Chest pain, unspecified: Secondary | ICD-10-CM | POA: Diagnosis present

## 2022-12-31 DIAGNOSIS — R739 Hyperglycemia, unspecified: Secondary | ICD-10-CM | POA: Diagnosis present

## 2022-12-31 DIAGNOSIS — I471 Supraventricular tachycardia, unspecified: Secondary | ICD-10-CM | POA: Diagnosis present

## 2022-12-31 DIAGNOSIS — Z8679 Personal history of other diseases of the circulatory system: Secondary | ICD-10-CM

## 2022-12-31 DIAGNOSIS — Z79899 Other long term (current) drug therapy: Secondary | ICD-10-CM

## 2022-12-31 LAB — ECHOCARDIOGRAM COMPLETE
Area-P 1/2: 4.6 cm2
S' Lateral: 2.9 cm

## 2023-01-12 ENCOUNTER — Encounter: Payer: Self-pay | Admitting: Internal Medicine

## 2023-02-05 IMAGING — CT CT FOOT*R* W/O CM
3 series · 14 of 33 positions shown, 17 images · non-contrast
Comparison: None.

CLINICAL DATA: Right metatarsal pain for 3 weeks status post fall

EXAM:
CT OF THE RIGHT FOOT WITHOUT CONTRAST
TECHNIQUE: Multidetector CT imaging of the right foot was performed according
to the standard protocol. Multiplanar CT image reconstructions were
also generated.

[Series 5: axial soft · axial · 0.39mm/px · z∈[+249,+379]mm · 6 of 86 slices shown, 8 images]
[im 14/86  soft-tissue]
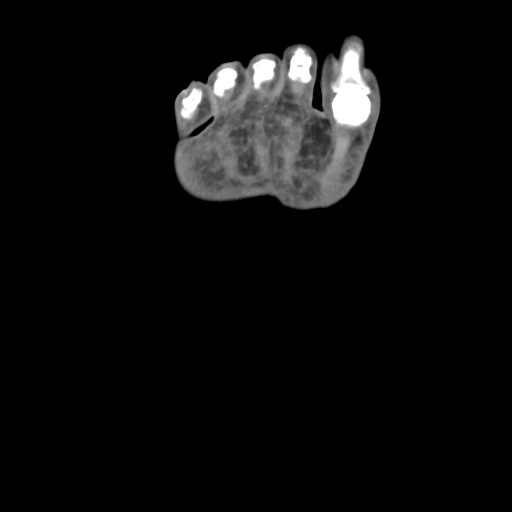
[im 14/86  bone]
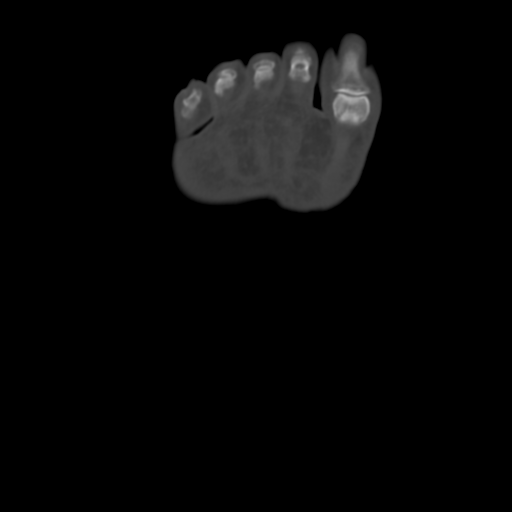
[im 27/86  bone]
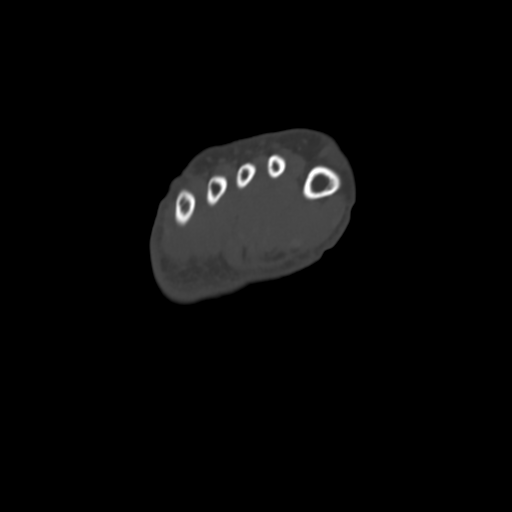
[im 40/86  bone]
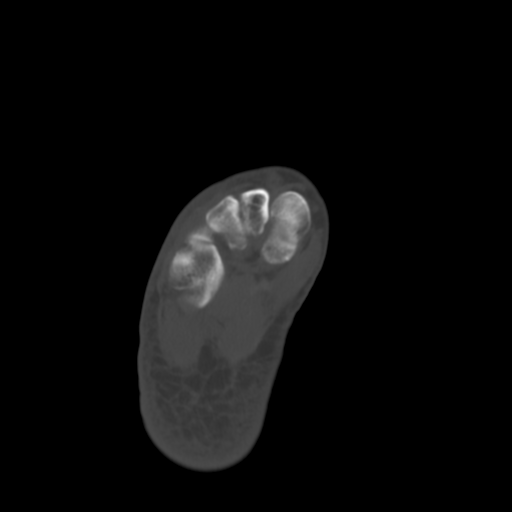
[im 53/86  bone]
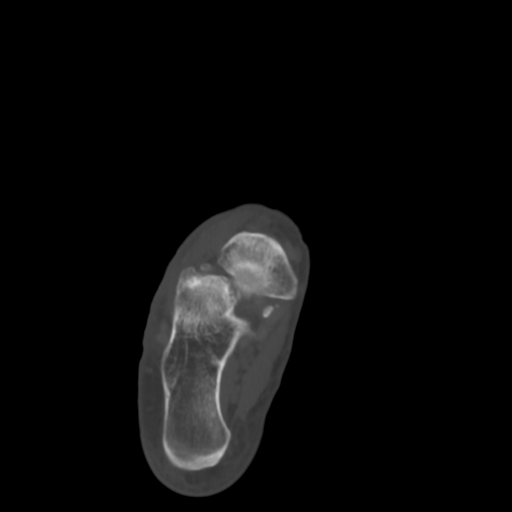
[im 66/86  soft-tissue]
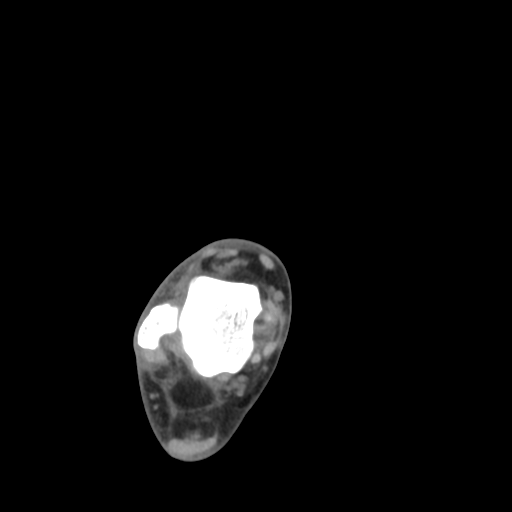
[im 66/86  bone]
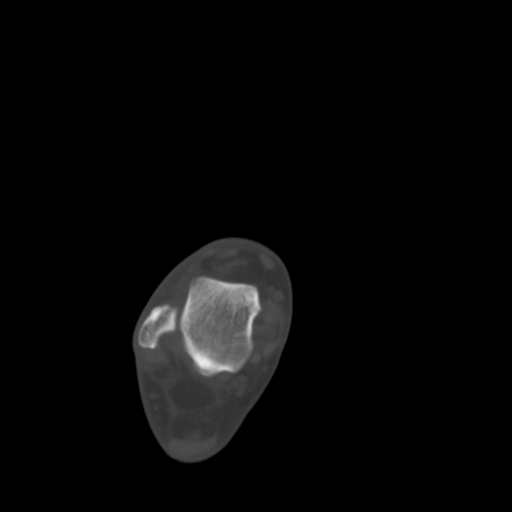
[im 79/86  bone]
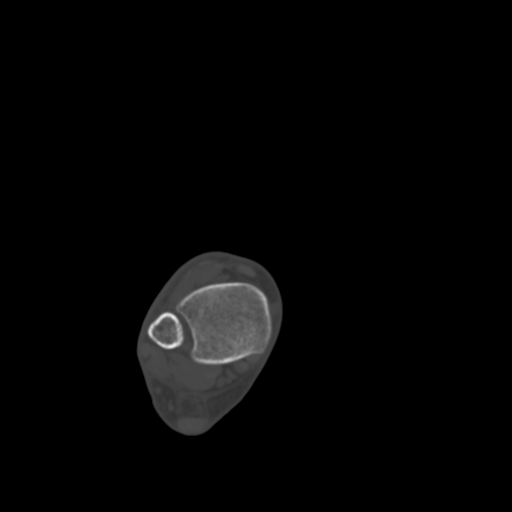

[Series 8: coronal soft · coronal · 0.38mm/px · 3 of 78 slices shown]
[im 30/78  bone]
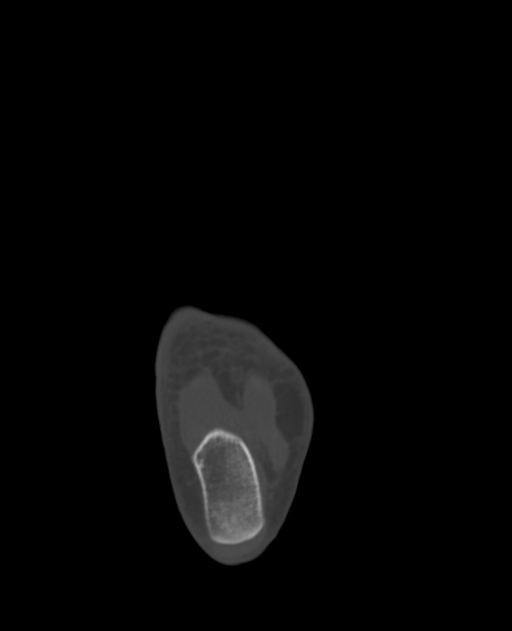
[im 38/78  bone]
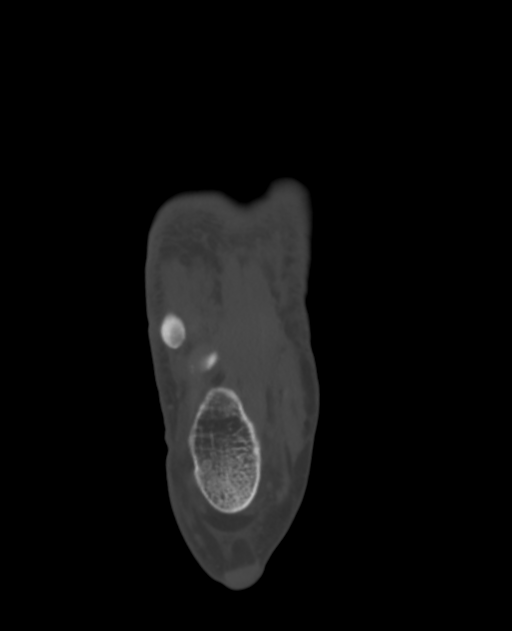
[im 47/78  bone]
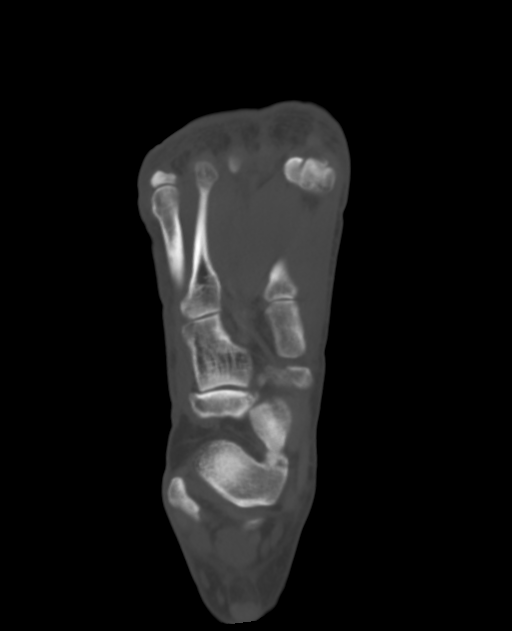

[Series 9: sagittal soft · sagittal · 0.36mm/px · 5 of 62 slices shown, 6 images]
[im 21/62  bone]
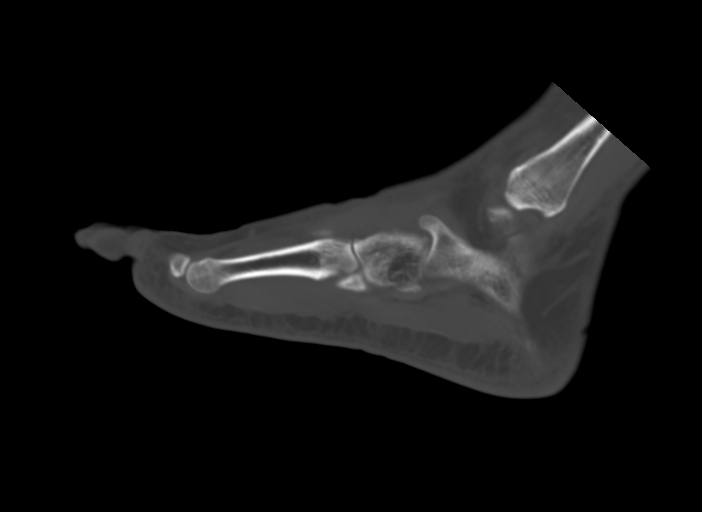
[im 26/62  bone]
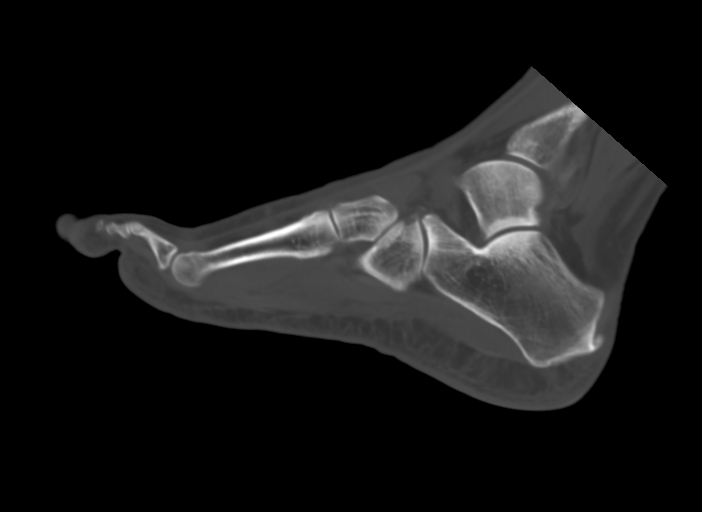
[im 31/62  soft-tissue]
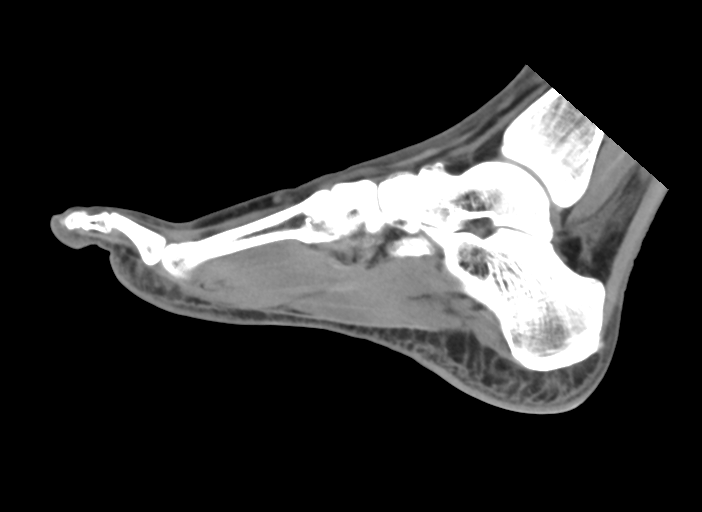
[im 31/62  bone]
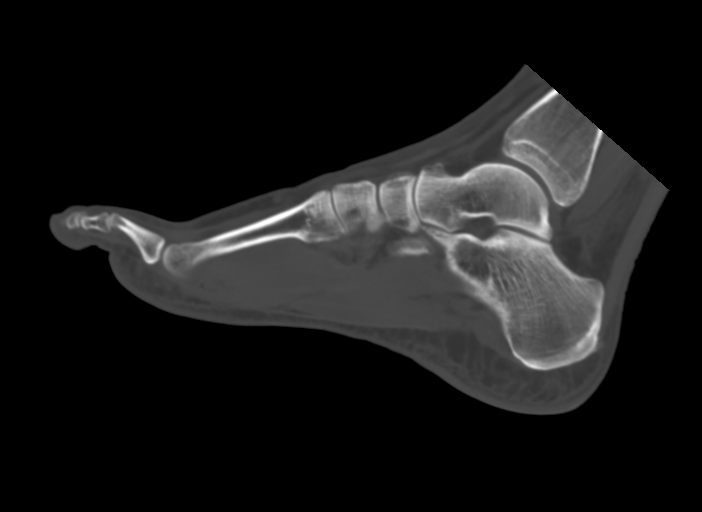
[im 36/62  bone]
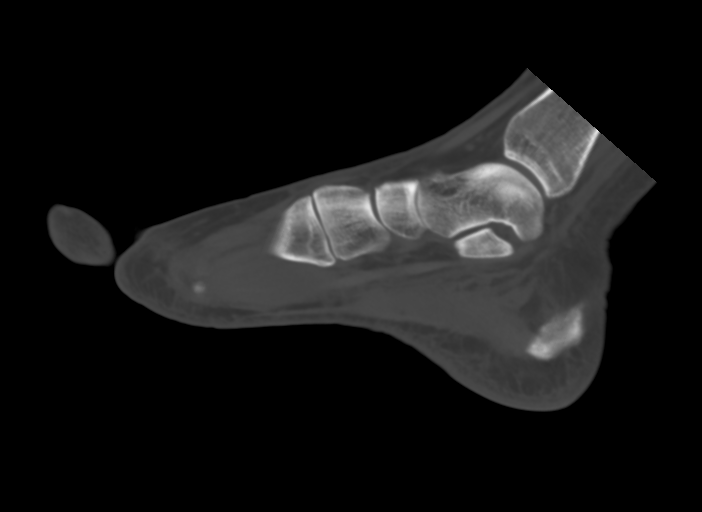
[im 41/62  bone]
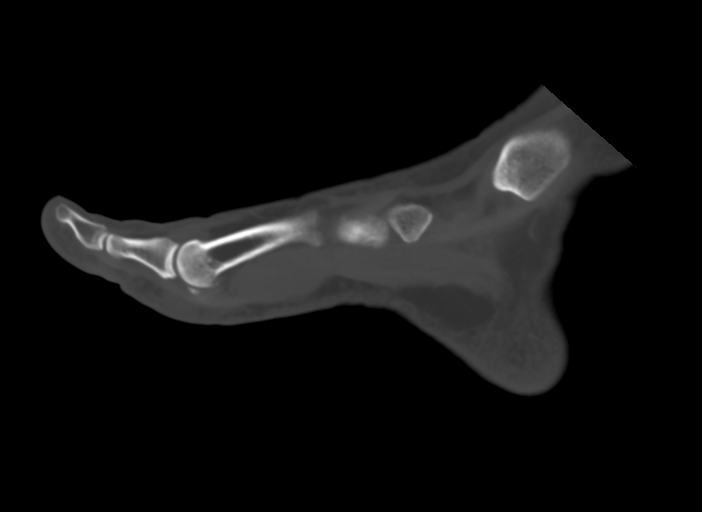

[14 of 33 positions shown; findings below may reference images not displayed]

FINDINGS: Bones/Joint/Cartilage

Subacute mildly comminuted fracture of the distal lateral corner of
the anterior process of the calcaneus.

No other acute fracture or dislocation. No aggressive osseous
lesion. Normal alignment. No joint effusion.

Ligaments

Ligaments are suboptimally evaluated by CT.

Muscles and Tendons
Muscles are normal. Flexor, extensor, peroneal and Achilles tendons
are grossly intact. Plantar fascia is grossly intact.

Soft tissue
No fluid collection or hematoma. No soft tissue mass.
IMPRESSION: 1. Subacute mildly comminuted fracture of the distal lateral corner
of the anterior process of the calcaneus.

## 2023-03-02 ENCOUNTER — Emergency Department (HOSPITAL_COMMUNITY): Payer: Managed Care, Other (non HMO)

## 2023-03-02 ENCOUNTER — Encounter (HOSPITAL_COMMUNITY): Payer: Self-pay

## 2023-03-02 ENCOUNTER — Other Ambulatory Visit: Payer: Self-pay

## 2023-03-02 ENCOUNTER — Observation Stay (HOSPITAL_COMMUNITY)
Admission: EM | Admit: 2023-03-02 | Discharge: 2023-03-03 | Disposition: A | Discharge status: Home / Self Care | Payer: Managed Care, Other (non HMO) | Attending: Internal Medicine | Admitting: Internal Medicine

## 2023-03-02 DIAGNOSIS — I5030 Unspecified diastolic (congestive) heart failure: Secondary | ICD-10-CM | POA: Diagnosis not present

## 2023-03-02 DIAGNOSIS — R0789 Other chest pain: Principal | ICD-10-CM | POA: Insufficient documentation

## 2023-03-02 DIAGNOSIS — Z87891 Personal history of nicotine dependence: Secondary | ICD-10-CM | POA: Insufficient documentation

## 2023-03-02 DIAGNOSIS — Z950 Presence of cardiac pacemaker: Secondary | ICD-10-CM | POA: Insufficient documentation

## 2023-03-02 DIAGNOSIS — I371 Nonrheumatic pulmonary valve insufficiency: Secondary | ICD-10-CM | POA: Insufficient documentation

## 2023-03-02 DIAGNOSIS — R071 Chest pain on breathing: Secondary | ICD-10-CM | POA: Diagnosis not present

## 2023-03-02 DIAGNOSIS — I110 Hypertensive heart disease with heart failure: Secondary | ICD-10-CM | POA: Insufficient documentation

## 2023-03-02 DIAGNOSIS — R079 Chest pain, unspecified: Secondary | ICD-10-CM | POA: Diagnosis not present

## 2023-03-02 DIAGNOSIS — Z91040 Latex allergy status: Secondary | ICD-10-CM | POA: Insufficient documentation

## 2023-03-02 DIAGNOSIS — Z8774 Personal history of (corrected) congenital malformations of heart and circulatory system: Secondary | ICD-10-CM

## 2023-03-02 DIAGNOSIS — Z79899 Other long term (current) drug therapy: Secondary | ICD-10-CM | POA: Diagnosis not present

## 2023-03-02 DIAGNOSIS — I10 Essential (primary) hypertension: Secondary | ICD-10-CM | POA: Diagnosis present

## 2023-03-02 DIAGNOSIS — I509 Heart failure, unspecified: Secondary | ICD-10-CM

## 2023-03-02 DIAGNOSIS — I495 Sick sinus syndrome: Secondary | ICD-10-CM | POA: Diagnosis present

## 2023-03-02 DIAGNOSIS — I480 Paroxysmal atrial fibrillation: Secondary | ICD-10-CM | POA: Diagnosis not present

## 2023-03-02 DIAGNOSIS — R0602 Shortness of breath: Secondary | ICD-10-CM | POA: Diagnosis present

## 2023-03-02 LAB — CBC
HCT: 43.4 % (ref 36.0–46.0)
HCT: 43.1 % (ref 36.0–46.0)
Hemoglobin: 13.8 g/dL (ref 12.0–15.0)
Hemoglobin: 13.9 g/dL (ref 12.0–15.0)
MCH: 28.3 pg (ref 26.0–34.0)
MCH: 28.4 pg (ref 26.0–34.0)
MCHC: 31.8 g/dL (ref 30.0–36.0)
MCHC: 32.3 g/dL (ref 30.0–36.0)
MCV: 89.1 fL (ref 80.0–100.0)
MCV: 88 fL (ref 80.0–100.0)
Platelets: 232 10*3/uL (ref 150–400)
Platelets: 249 10*3/uL (ref 150–400)
RBC: 4.87 MIL/uL (ref 3.87–5.11)
RBC: 4.9 MIL/uL (ref 3.87–5.11)
RDW: 13.5 % (ref 11.5–15.5)
RDW: 13.5 % (ref 11.5–15.5)
WBC: 6.5 10*3/uL (ref 4.0–10.5)
WBC: 6.2 10*3/uL (ref 4.0–10.5)
nRBC: 0 % (ref 0.0–0.2)
nRBC: 0 % (ref 0.0–0.2)

## 2023-03-02 LAB — BASIC METABOLIC PANEL
Anion gap: 14 (ref 5–15)
BUN: 16 mg/dL (ref 6–20)
CO2: 18 mmol/L — ABNORMAL LOW (ref 22–32)
Calcium: 9.1 mg/dL (ref 8.9–10.3)
Chloride: 109 mmol/L (ref 98–111)
Creatinine, Ser: 0.79 mg/dL (ref 0.44–1.00)
GFR, Estimated: >60 mL/min (ref >60)
Glucose, Bld: 73 mg/dL (ref 70–99)
Potassium: 4 mmol/L (ref 3.5–5.1)
Sodium: 141 mmol/L (ref 135–145)

## 2023-03-02 LAB — TROPONIN I (HIGH SENSITIVITY)
Troponin I (High Sensitivity): 3 ng/L (ref <18)
Troponin I (High Sensitivity): 3 ng/L (ref <18)
Troponin I (High Sensitivity): 4 ng/L (ref <18)

## 2023-03-02 LAB — HIV ANTIBODY (ROUTINE TESTING W REFLEX): HIV Screen 4th Generation wRfx: NONREACTIVE

## 2023-03-02 LAB — CREATININE, SERUM
Creatinine, Ser: 0.74 mg/dL (ref 0.44–1.00)
GFR, Estimated: >60 mL/min (ref >60)

## 2023-03-02 LAB — BRAIN NATRIURETIC PEPTIDE: B Natriuretic Peptide: 54.2 pg/mL (ref 0.0–100.0)

## 2023-03-02 LAB — D-DIMER, QUANTITATIVE: D-Dimer, Quant: 0.27 ug/mL-FEU (ref 0.00–0.50)

## 2023-03-02 MED ORDER — TRAZODONE HCL 50 MG PO TABS: 50.0000 mg | ORAL_TABLET | Freq: Every evening | ORAL | Status: DC | PRN

## 2023-03-02 MED ORDER — METOPROLOL SUCCINATE ER 25 MG PO TB24
12.5000 mg | ORAL_TABLET | Freq: Every day | ORAL | Status: DC
  Administered 2023-03-02 – 2023-03-03 (×2): 12.5 mg via ORAL
  Filled 2023-03-02 (×2): qty 1

## 2023-03-02 MED ORDER — ENOXAPARIN SODIUM 60 MG/0.6ML IJ SOSY: 50.0000 mg | PREFILLED_SYRINGE | INTRAMUSCULAR | Status: DC

## 2023-03-02 MED ORDER — HYDROCORTISONE 1 % EX CREA: 1.0000 | TOPICAL_CREAM | Freq: Three times a day (TID) | CUTANEOUS | Status: DC | PRN

## 2023-03-02 MED ORDER — PHENOL 1.4 % MT LIQD: 1.0000 | OROMUCOSAL | Status: DC | PRN

## 2023-03-02 MED ORDER — LORATADINE 10 MG PO TABS: 10.0000 mg | ORAL_TABLET | Freq: Every day | ORAL | Status: DC | PRN

## 2023-03-02 MED ORDER — METOPROLOL TARTRATE 5 MG/5ML IV SOLN: 5.0000 mg | INTRAVENOUS | Status: DC | PRN

## 2023-03-02 MED ORDER — HYDRALAZINE HCL 20 MG/ML IJ SOLN: 10.0000 mg | INTRAMUSCULAR | Status: DC | PRN

## 2023-03-02 MED ORDER — IPRATROPIUM-ALBUTEROL 0.5-2.5 (3) MG/3ML IN SOLN: 3.0000 mL | RESPIRATORY_TRACT | Status: DC | PRN

## 2023-03-02 MED ORDER — MUSCLE RUB 10-15 % EX CREA: 1.0000 | TOPICAL_CREAM | CUTANEOUS | Status: DC | PRN

## 2023-03-02 MED ORDER — HYDROCORTISONE (PERIANAL) 2.5 % EX CREA: 1.0000 | TOPICAL_CREAM | Freq: Four times a day (QID) | CUTANEOUS | Status: DC | PRN

## 2023-03-02 MED ORDER — OXYCODONE HCL 5 MG PO TABS: 5.0000 mg | ORAL_TABLET | ORAL | Status: DC | PRN

## 2023-03-02 MED ORDER — ROSUVASTATIN CALCIUM 5 MG PO TABS
10.0000 mg | ORAL_TABLET | Freq: Every day | ORAL | Status: DC
  Administered 2023-03-02: 10 mg via ORAL
  Filled 2023-03-02 (×2): qty 2

## 2023-03-02 MED ORDER — SALINE SPRAY 0.65 % NA SOLN: 1.0000 | NASAL | Status: DC | PRN

## 2023-03-02 MED ORDER — POLYVINYL ALCOHOL 1.4 % OP SOLN: 1.0000 [drp] | OPHTHALMIC | Status: DC | PRN

## 2023-03-02 MED ORDER — ALUM & MAG HYDROXIDE-SIMETH 200-200-20 MG/5ML PO SUSP: 30.0000 mL | ORAL | Status: DC | PRN

## 2023-03-02 MED ORDER — PANTOPRAZOLE SODIUM 40 MG PO TBEC
40.0000 mg | DELAYED_RELEASE_TABLET | Freq: Every day | ORAL | Status: DC
  Administered 2023-03-03: 40 mg via ORAL
  Filled 2023-03-02: qty 1

## 2023-03-02 MED ORDER — GUAIFENESIN 100 MG/5ML PO LIQD: 5.0000 mL | ORAL | Status: DC | PRN

## 2023-03-02 MED ORDER — ACETAMINOPHEN 325 MG PO TABS
650.0000 mg | ORAL_TABLET | Freq: Four times a day (QID) | ORAL | Status: DC | PRN
  Administered 2023-03-02 – 2023-03-03 (×2): 650 mg via ORAL
  Filled 2023-03-02 (×2): qty 2

## 2023-03-02 MED ORDER — ONDANSETRON HCL 4 MG/2ML IJ SOLN: 4.0000 mg | Freq: Four times a day (QID) | INTRAMUSCULAR | Status: DC | PRN

## 2023-03-02 MED ORDER — SENNOSIDES-DOCUSATE SODIUM 8.6-50 MG PO TABS: 1.0000 | ORAL_TABLET | Freq: Every evening | ORAL | Status: DC | PRN

## 2023-03-02 NOTE — ED Triage Notes (Signed)
Pt c/o non radiating left sided chest pain and SOB, nausea, lightheaded, dizziness, tingling in left arm and hand started yesterday. Pt able to speak in complete sentences.

## 2023-03-02 NOTE — ED Notes (Signed)
ED TO INPATIENT HANDOFF REPORT  ED Nurse Name and Phone #: Beatris Ship RN [phone]  S Name/Age/Gender Tonya Gardner 52 y.o. female Room/Bed: 026C/026C  Code Status   Code Status: Full Code  Home/SNF/Other Home Patient oriented to: self, place, time, and situation Is this baseline? Yes   Triage Complete: Triage complete  Chief Complaint Chest pain [R07.9]  Triage Note Pt c/o non radiating left sided chest pain and SOB, nausea, lightheaded, dizziness, tingling in left arm and hand started yesterday. Pt able to speak in complete sentences.   Allergies Allergies  Allergen Reactions   Sulfonamide Derivatives Hives   Latex Rash    Level of Care/Admitting Diagnosis ED Disposition     ED Disposition  Admit   Condition  --   Comment  Hospital Area: [facility] [[id number]]  Level of Care: Telemetry Cardiac [103]  May place patient in observation at [facility] if equivalent level of care is available:: Yes  Covid Evaluation: Asymptomatic - no recent exposure (last 10 days) testing not required  Diagnosis: Chest pain [454098]  Admitting Physician: Stephania Fragmin [facility] [[id number]]  Attending Physician: Stephania Fragmin [facility] [[id number]]          B Medical/Surgery History Past Medical History:  Diagnosis Date   CHF (congestive heart failure) (HCC) 2002   with pregnancy   Headache    Presence of permanent cardiac pacemaker    off for 2 years   Seizures (HCC) 2002   due to eclampsia   Sinus bradycardia    Tetralogy of Fallot s/p repair 1975   Past Surgical History:  Procedure Laterality Date   ABDOMINAL HYSTERECTOMY N/A 08/12/2014   Procedure: HYSTERECTOMY ABDOMINAL;  Surgeon: Jeani Hawking, MD;  Location: [facility];  Service: Gynecology;  Laterality: N/A;   CESAREAN SECTION     LAPAROSCOPIC ASSISTED VAGINAL HYSTERECTOMY N/A 08/12/2014   Procedure: ATTEMPTED LAPAROSCOPIC ASSISTED VAGINAL HYSTERECTOMY;  Surgeon: Jeani Hawking, MD;  Location:  [facility];  Service: Gynecology;  Laterality: N/A;   PACEMAKER INSERTION     [employer]    RIGHT/LEFT HEART CATH AND CORONARY ANGIOGRAPHY N/A 07/01/2020   Procedure: RIGHT/LEFT HEART CATH AND CORONARY ANGIOGRAPHY;  Surgeon: Corky Crafts, MD;  Location: [facility] INVASIVE CV LAB;  Service: Cardiovascular;  Laterality: N/A;   SALPINGOOPHORECTOMY Bilateral 08/12/2014   Procedure: SALPINGO OOPHORECTOMY;  Surgeon: Jeani Hawking, MD;  Location: [facility];  Service: Gynecology;  Laterality: Bilateral;   SVT ABLATION N/A 08/21/2020   Procedure: SVT ABLATION;  Surgeon: Marinus Maw, MD;  Location: [facility] INVASIVE CV LAB;  Service: Cardiovascular;  Laterality: N/A;   TETRALOGY OF FALLOT REPAIR     TONSILLECTOMY       A IV Location/Drains/Wounds Patient Lines/Drains/Airways Status     Active Line/Drains/Airways     Name Placement date Placement time Site Days   Sheath 07/01/20 Right Arterial;Femoral 07/01/20  1003  Arterial;Femoral  974   Sheath 07/01/20 Right Venous;Femoral 07/01/20  1005  Venous;Femoral  974   Incision (Closed) 08/12/14 Perineum Other (Comment) 08/12/14  0940  -- 3124   Incision (Closed) 08/12/14 Abdomen Other (Comment) 08/12/14  0940  -- 3124   Incision - 1 Port Abdomen 1: Umbilicus 04/06/14  --  -- 3252   Incision - 1 Port Abdomen 1: Lower 08/12/14  0836  -- 3124            Intake/Output Last 24 hours No intake or output data in the 24 hours ending  03/02/23 1642  Labs/Imaging Results for orders placed or performed during the hospital encounter of 03/02/23 (from the past 48 hour(s))  Basic metabolic panel     Status: Abnormal   Collection Time: 03/02/23 11:35 AM  Result Value Ref Range   Sodium 141 135 - 145 mmol/L   Potassium 4.0 3.5 - 5.1 mmol/L   Chloride 109 98 - 111 mmol/L   CO2 18 (L) 22 - 32 mmol/L   Glucose, Bld 73 70 - 99 mg/dL    Comment: Glucose reference range applies only to samples taken after fasting for at least 8 hours.   BUN 16 6 - 20 mg/dL    Creatinine, Ser 6.04 0.44 - 1.00 mg/dL   Calcium 9.1 8.9 - 54.0 mg/dL   GFR, Estimated >98 >11 mL/min    Comment: (NOTE) Calculated using the CKD-EPI Creatinine Equation (2021)    Anion gap 14 5 - 15    Comment: Performed at [facility], [address] [place], [place] [address]  CBC     Status: None   Collection Time: 03/02/23 11:35 AM  Result Value Ref Range   WBC 6.5 4.0 - 10.5 K/uL   RBC 4.87 3.87 - 5.11 MIL/uL   Hemoglobin 13.8 12.0 - 15.0 g/dL   HCT 29.5 62.1 - 30.8 %   MCV 89.1 80.0 - 100.0 fL   MCH 28.3 26.0 - 34.0 pg   MCHC 31.8 30.0 - 36.0 g/dL   RDW 65.7 84.6 - 96.2 %   Platelets 232 150 - 400 K/uL   nRBC 0.0 0.0 - 0.2 %    Comment: Performed at [facility], [address], [place], [place] [address]  Troponin I (High Sensitivity)     Status: None   Collection Time: 03/02/23 11:35 AM  Result Value Ref Range   Troponin I (High Sensitivity) 3 <18 ng/L    Comment: (NOTE) Elevated high sensitivity troponin I (hsTnI) values and significant  changes across serial measurements may suggest ACS but many other  chronic and acute conditions are known to elevate hsTnI results.  Refer to the "Links" section for chest pain algorithms and additional  guidance. Performed at [facility], [address], [place], [place] [address]   Troponin I (High Sensitivity)     Status: None   Collection Time: 03/02/23  2:47 PM  Result Value Ref Range   Troponin I (High Sensitivity) 3 <18 ng/L    Comment: (NOTE) Elevated high sensitivity troponin I (hsTnI) values and significant  changes across serial measurements may suggest ACS but many other  chronic and acute conditions are known to elevate hsTnI results.  Refer to the "Links" section for chest pain algorithms and additional  guidance. Performed at [facility], [address], [place], [place] [address]    DG Chest 2 View  Result Date: 03/02/2023 CLINICAL DATA:  Chest pain EXAM: CHEST - 2 VIEW  COMPARISON:  01/04/2021 FINDINGS: Transverse diameter of heart is increased. There are no signs of pulmonary edema or focal pulmonary consolidation. There is no pleural effusion or pneumothorax. Pacemaker battery is seen in the left infraclavicular region. IMPRESSION: Cardiomegaly. There are no signs of pulmonary edema or focal pulmonary consolidation. Electronically Signed   By: Ernie Avena M.D.   On: 03/02/2023 12:30    Pending Labs Unresulted Labs (From admission, onward)     Start     Ordered   03/09/23 0500  Creatinine, serum  (enoxaparin (LOVENOX)  CrCl >/= 30 ml/min)  Weekly,   R     Comments: while on enoxaparin therapy    03/02/23 1552   03/03/23 0500  Basic metabolic panel  Daily,   R      03/02/23 1550   03/03/23 0500  CBC  Daily,   R      03/02/23 1550   03/03/23 0500  Magnesium  Daily,   R      03/02/23 1550   03/02/23 1553  Brain natriuretic peptide  Add-on,   AD        03/02/23 1553   03/02/23 1552  HIV Antibody (routine testing w rflx)  (HIV Antibody (Routine testing w reflex) panel)  Once,   R        03/02/23 1552   03/02/23 1552  CBC  (enoxaparin (LOVENOX)    CrCl >/= 30 ml/min)  Once,   R       Comments: Baseline for enoxaparin therapy IF NOT ALREADY DRAWN.  Notify MD if PLT < 100 K.    03/02/23 1552   03/02/23 1552  Creatinine, serum  (enoxaparin (LOVENOX)    CrCl >/= 30 ml/min)  Once,   R       Comments: Baseline for enoxaparin therapy IF NOT ALREADY DRAWN.    03/02/23 1552            Vitals/Pain Today's Vitals   03/02/23 1430 03/02/23 1500 03/02/23 1515 03/02/23 1600  BP: 103/64  (!) 106/39 (!) 99/50  Pulse: 69  81 71  Resp: 20  19 (!) 23  Temp:  98.4 F (36.9 C)    TempSrc:  Oral    SpO2: 97%  100% 100%  Weight:      Height:      PainSc:        Isolation Precautions No active isolations  Medications Medications  ipratropium-albuterol (DUONEB) 0.5-2.5 (3) MG/3ML nebulizer solution 3 mL (has no administration in time range)   ondansetron (ZOFRAN) injection 4 mg (has no administration in time range)  acetaminophen (TYLENOL) tablet 650 mg (has no administration in time range)  traZODone (DESYREL) tablet 50 mg (has no administration in time range)  senna-docusate (Senokot-S) tablet 1 tablet (has no administration in time range)  guaiFENesin (ROBITUSSIN) 100 MG/5ML liquid 5 mL (has no administration in time range)  oxyCODONE (Oxy IR/ROXICODONE) immediate release tablet 5 mg (has no administration in time range)  loratadine (CLARITIN) tablet 10 mg (has no administration in time range)  polyvinyl alcohol (LIQUIFILM TEARS) 1.4 % ophthalmic solution 1 drop (has no administration in time range)  hydrocortisone (ANUSOL-HC) 2.5 % rectal cream 1 Application (has no administration in time range)  alum & mag hydroxide-simeth (MAALOX/MYLANTA) 200-200-20 MG/5ML suspension 30 mL (has no administration in time range)  hydrocortisone cream 1 % 1 Application (has no administration in time range)  Muscle Rub CREA 1 Application (has no administration in time range)  sodium chloride (OCEAN) 0.65 % nasal spray 1 spray (has no administration in time range)  phenol (CHLORASEPTIC) mouth spray 1 spray (has no administration in time range)  metoprolol tartrate (LOPRESSOR) injection 5 mg (has no administration in time range)  hydrALAZINE (APRESOLINE) injection 10 mg (has no administration in time range)  metoprolol succinate (TOPROL-XL) 24 hr tablet 12.5 mg (has no administration in time range)  rosuvastatin (CRESTOR) tablet 10 mg (has no administration in time range)  pantoprazole (PROTONIX) EC tablet 40 mg (has no administration in time range)  enoxaparin (LOVENOX) injection 50 mg (  has no administration in time range)    Mobility walks     Focused Assessments Pulmonary Assessment Handoff:  Lung sounds:   O2 Device: Room Air      R Recommendations: See Admitting Provider Note  Report given to: [phone]

## 2023-03-02 NOTE — Consult Note (Signed)
Cardiology Consultation   Patient ID: Tonya Gardner MRN: [id number]; DOB: October 20, 1970  Admit date: 03/02/2023 Date of Consult: 03/02/2023  PCP:  Aviva Kluver   [facility] Providers Cardiologist:  Dietrich Pates, MD  Electrophysiologist:  Lewayne Bunting, MD       Patient Profile:   Tonya Gardner is a 52 y.o. 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 Particia Nearing.  History of Present Illness:   Tonya Gardner with above hx with tetrology of Fallot with repair in 1975, severe Huston Foley in 2002 in setting of pre-ecamplasia/eclampsia s/p PPM battery endo of life 2012 and declined extraction.  Echo with decrease EF to 35-40% in 2021 and pt with SOB.  Cardiac cath with normal pressures, normal cors.  Placed on Entresto.  --in Dec 2021 had ablation of atrial tach X 2 and atrial flutter.  In 2022 echo with EF 45-50%.   Most recent echo with normal EF  but with increased fatigue we repeated and EF 50-55%, some RWMA basal RV is dilated RV systolic function is mildly reduced.  RV size is mildly dilated.  Mild MR.   Significant PR for age with mean gradient 8 mmHg Peak 16 mmHg.  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  Dilated RVOT.    She wore event monitor Triggered events corresponded to sinus rhythm, one episode of sinus rhythm and VT. Other episodes of tachycardia not sensed.  Predominant rhythm is SR  Rates 41 to 193 bpm  Average HR 70 bpm  Rare PVC, PAC Short bursts of SVT   fastest for 4 beats at 193 bpm, longest for 12.7 sec at 128 bpm   2 runs of VT, fastest for 8 beats at 154 bpm, longest 11 bpm at 121 bpm  With pt still having fatigue so referred to [facility].was seen 02/17/23 plan for  gated CTA valve protocol to assess whether she is candidate for transcatherter pulmonary valve replacement.  Today she presents to ER today after 24 hours of chest pain and pressure, lt arm numbness dizziness and lightheadedness.  These have been her symptoms but usually do not last this long and this seems more severe.   EKG:  The EKG was personally reviewed and demonstrates:  SR with RBBB no acute changes Telemetry:  Telemetry was personally reviewed and demonstrates:  SR to SB Na 141 K+ 4.0 BUN 16 Cr 0.79   Hs troponin 3 and 3  WBC 6.5 Hgb 13.8 plts 232  2V CXR  IMPRESSION: Cardiomegaly. There are no signs of pulmonary edema or focal pulmonary consolidation.  BP 99/50  P 71 R 20 afebrile  Past Medical History:  Diagnosis Date   CHF (congestive heart failure) (HCC) 2002   with pregnancy   Headache    Presence of permanent cardiac pacemaker    off for 2 years   Seizures (HCC) 2002   due to eclampsia   Sinus bradycardia    Tetralogy of Fallot s/p repair 1975    Past Surgical History:  Procedure Laterality Date   ABDOMINAL HYSTERECTOMY N/A 08/12/2014   Procedure: HYSTERECTOMY ABDOMINAL;  Surgeon: Jeani Hawking, MD;  Location: [facility];  Service:  Gynecology;  Laterality: N/A;   CESAREAN SECTION     LAPAROSCOPIC ASSISTED VAGINAL HYSTERECTOMY N/A 08/12/2014   Procedure: ATTEMPTED LAPAROSCOPIC ASSISTED VAGINAL HYSTERECTOMY;  Surgeon: Jeani Hawking, MD;  Location: [facility];  Service: Gynecology;  Laterality: N/A;   PACEMAKER INSERTION     [employer]tr[employer]    RIGHT/LEFT HEART CATH AND CORONARY ANGIOGRAPHY N/A 07/01/2020   Procedure: RIGHT/LEFT HEART CATH AND CORONARY ANGIOGRAPHY;  Surgeon: Corky Crafts, MD;  Location: [facility] INVASIVE CV LAB;  Service: Cardiovascular;  Laterality: N/A;   SALPINGOOPHORECTOMY Bilateral 08/12/2014   Procedure: SALPINGO OOPHORECTOMY;  Surgeon: Jeani Hawking, MD;  Location: [facility];  Service: Gynecology;  Laterality: Bilateral;   SVT ABLATION N/A  08/21/2020   Procedure: SVT ABLATION;  Surgeon: Marinus Maw, MD;  Location: [facility] INVASIVE CV LAB;  Service: Cardiovascular;  Laterality: N/A;   TETRALOGY OF FALLOT REPAIR     TONSILLECTOMY       Home Medications:  Prior to Admission medications   Medication Sig Start Date End Date Taking? Authorizing Provider  ibuprofen (ADVIL) 200 MG tablet Take 400 mg by mouth every 8 (eight) hours as needed for mild pain or moderate pain (pain.).    [provider]  levocetirizine (XYZAL) 5 MG tablet Take by mouth daily at 6 (six) AM. 01/26/18   [provider]  metoprolol succinate (TOPROL-XL) 25 MG 24 hr tablet Take 0.5 tablets (12.5 mg total) by mouth daily. 12/02/22   Duke Salvia, MD  pantoprazole (PROTONIX) 40 MG tablet TAKE 1 TABLET(40 MG) BY MOUTH DAILY 03/29/22   Pricilla Riffle, MD  rosuvastatin (CRESTOR) 10 MG tablet Take 1 tablet (10 mg total) by mouth daily. 03/31/22   Pricilla Riffle, MD    Inpatient Medications: Scheduled Meds:  Continuous Infusions:  PRN Meds:   Allergies:    Allergies  Allergen Reactions   Sulfonamide Derivatives Hives   Latex Rash    Social History:   Social History   Socioeconomic History   Marital status: Divorced    Spouse name: Not on file   Number of children: Not on file   Years of education: Not on file   Highest education level: Not on file  Occupational History   Not on file  Tobacco Use   Smoking status: Former    Types: Cigarettes   Smokeless tobacco: Never  Vaping Use   Vaping Use: Never used  Substance and Sexual Activity   Alcohol use: Yes    Comment: occasionally   Drug use: No   Sexual activity: Yes    Birth control/protection: None  Other Topics Concern   Not on file  Social History Narrative   Not on file   Social Determinants of Health   Financial Resource Strain: Not on file  Food Insecurity: Not on file  Transportation Needs: Not on file  Physical Activity: Not on file  Stress: Not on file  Social  Connections: Not on file  Intimate Partner Violence: Not on file    Family History:   History reviewed. No pertinent family history.  Not pertinent to this admit  ROS:  Please see the history of present illness.  General:no colds or fevers, no weight changes Skin:no rashes or ulcers HEENT:no blurred vision, no congestion CV:see HPI PUL:see HPI GI:no diarrhea constipation or melena, no indigestion GU:no hematuria, no dysuria MS:no joint pain, no claudication Neuro:no syncope, no lightheadedness Endo:no diabetes, no thyroid disease  All other ROS reviewed and negative.  Physical Exam/Data:   Vitals:   03/02/23 1330 03/02/23 1345 03/02/23 1430 03/02/23 1500  BP: (!) 112/47 (!) 92/57 103/64   Pulse: 82 71 69   Resp: (!) 21 18 20    Temp:    98.4 F (36.9 C)  TempSrc:    Oral  SpO2: 100% 98% 97%   Weight:      Height:       No intake or output data in the 24 hours ending 03/02/23 1540    03/02/2023   11:23 AM 12/02/2022   11:06 AM 03/29/2022   11:08 AM  Last 3 Weights  Weight (lbs) 212 lb 1.3 oz 212 lb 216 lb 3.2 oz  Weight (kg) 96.2 kg 96.163 kg 98.068 kg     Body mass index is 38.79 kg/m.  General:  Well nourished, well developed, in no acute distress resting in bed, walking to BR very SOB  HEENT: normal Neck: no JVD Vascular: No carotid bruits; Distal pulses 2+ bilaterally Cardiac:  normal S1, S2; RRR; + murmurs (per [facility] 2/6 SEM and 2/4 diastolic regurg) no clicks or gallops Lungs:  clear to auscultation bilaterally, no wheezing, rhonchi or rales  Abd: soft, nontender, no hepatomegaly  Ext: no edema Musculoskeletal:  No deformities, BUE and BLE strength normal and equal Skin: warm and dry  Neuro:  alert and oriented X 3 MAE follows commands, no focal abnormalities noted Psych:  Normal affect    Relevant CV Studies: Echo 12/31/22 IMPRESSIONS     1. Inferior basal hypokinesis . Left ventricular ejection fraction, by  estimation, is 50 to 55%. The left  ventricle has low normal function. The  left ventricle demonstrates regional wall motion abnormalities (see  scoring diagram/findings for  description). Left ventricular diastolic parameters were normal.   2. Basal RV is dilated . Right ventricular systolic function is mildly  reduced. The right ventricular size is moderately enlarged.   3. Left atrial size was mildly dilated.   4. The mitral valve is abnormal. Mild mitral valve regurgitation. No  evidence of mitral stenosis.   5. The aortic valve is normal in structure. Aortic valve regurgitation is  not visualized. No aortic stenosis is present.   6. Significant PR for age with mean gradient 8 mmHg Peak 16 mmHg . The  pulmonic valve was abnormal. 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   7. Dilated RVOT.   8. The inferior vena cava is normal in size with greater than 50%  respiratory variability, suggesting right atrial pressure of 3 mmHg.   FINDINGS   Left Ventricle: Inferior basal hypokinesis. Left ventricular ejection  fraction, by estimation, is 50 to 55%. The left ventricle has low normal  function. The left ventricle demonstrates regional wall motion  abnormalities. The left ventricular internal  cavity size was normal in size. There is no left ventricular hypertrophy.  Left ventricular diastolic parameters were normal. The ratio of pulmonic  flow to systemic flow (Qp/Qs ratio) is 1.90.   Right Ventricle: Basal RV is dilated. The right ventricular size is  moderately enlarged. No increase in right ventricular wall thickness.  Right ventricular systolic function is mildly reduced.   Left Atrium: Left atrial size was mildly dilated.   Right Atrium: Right atrial size was normal in size.   Pericardium: There is no evidence of pericardial effusion.   Mitral Valve: The mitral valve is abnormal. There is mild thickening of  the mitral valve leaflet(s). Mild mitral valve regurgitation. No evidence  of  mitral valve stenosis.   Tricuspid Valve: The tricuspid valve is normal in structure. Tricuspid  valve regurgitation is mild . No evidence of tricuspid stenosis.   Aortic Valve: The aortic valve is normal in structure. Aortic valve  regurgitation is not visualized. No aortic stenosis is present.   Pulmonic Valve: Significant PR for age with mean gradient 8 mmHg Peak 16  mmHg. The pulmonic valve was abnormal. Pulmonic valve regurgitation is  severe. Mild pulmonic stenosis.   Aorta: The aortic root is normal in size and structure.   Pulmonary Artery: Dilated RVOT.   Cardiac cath 07/01/2020  No left main. Separate ostia of LAD and circumflex. No angiographically apparent CAD. There is mild to moderate left ventricular systolic dysfunction. LV end diastolic pressure is normal. The left ventricular ejection fraction is 35-45% by visual estimate. There is no aortic valve stenosis. Ao 95%, PA 73%, PA pressure 21/6, mean PA pressure 12 mm Hg; PCWP 9 mm Hg; CO 5.9 L/min; CI 3.29 Normal right heart pressures.   Continue preventive therapy. Nonischemic cardiomyopathy.     Laboratory Data:  High Sensitivity Troponin:   Recent Labs  Lab 03/02/23 1135 03/02/23 1447  TROPONINIHS 3 3     Chemistry Recent Labs  Lab 03/02/23 1135  NA 141  K 4.0  CL 109  CO2 18*  GLUCOSE 73  BUN 16  CREATININE 0.79  CALCIUM 9.1  GFRNONAA >60  ANIONGAP 14    No results for input(s): "PROT", "ALBUMIN", "AST", "ALT", "ALKPHOS", "BILITOT" in the last 168 hours. Lipids No results for input(s): "CHOL", "TRIG", "HDL", "LABVLDL", "LDLCALC", "CHOLHDL" in the last 168 hours.  Hematology Recent Labs  Lab 03/02/23 1135  WBC 6.5  RBC 4.87  HGB 13.8  HCT 43.4  MCV 89.1  MCH 28.3  MCHC 31.8  RDW 13.5  PLT 232   Thyroid No results for input(s): "TSH", "FREET4" in the last 168 hours.  BNPNo results for input(s): "BNP", "PROBNP" in the last 168 hours.  DDimer No results for input(s): "DDIMER" in the  last 168 hours.   Radiology/Studies:  DG Chest 2 View  Result Date: 03/02/2023 CLINICAL DATA:  Chest pain EXAM: CHEST - 2 VIEW COMPARISON:  01/04/2021 FINDINGS: Transverse diameter of heart is increased. There are no signs of pulmonary edema or focal pulmonary consolidation. There is no pleural effusion or pneumothorax. Pacemaker battery is seen in the left infraclavicular region. IMPRESSION: Cardiomegaly. There are no signs of pulmonary edema or focal pulmonary consolidation. Electronically Signed   By: Ernie Avena M.D.   On: 03/02/2023 12:30     Assessment and Plan:   Chest pain, SOB secondary to PVR-severe.  Hs troponins neg , normal cors in 2021, plan for limited Echo-  has been seen and evaluated by Dr. Mayo Ao at [facility] with plan for CTa to eval valve better, unable to do MRI due to pacemaker that expired.  Hope is to do transcatheter pulmonary valve replacement.   The CT is not until July 24.   This has been most severe episode.  Will do limited echo   continue home meds toprol XL, crestor  does not seem volume overloaded. May need transfer to [facility] for further workup TOF repair 1975  Hx SVTs with ablation and a flutter ablation Hx of bradycardia with eclampsia requiring PPM, now nonfunctional. Hx of HFrEF but normal on last 2 echoes.    Risk Assessment/Risk Scores:  For questions or updates, please contact [facility] Please consult [email or web address] for contact info under    Signed, Nada Boozer, NP  03/02/2023 3:40 PM

## 2023-03-02 NOTE — H&P (Signed)
History and Physical    Tonya Gardner ZOX:[id number] DOB: 1971/06/10 DOA: 03/02/2023  PCP: Pcp, No Patient coming from: Home  Chief Complaint: Chest pain and shortness of breath  HPI: Tonya Gardner is a 52 y.o. female with medical history significant of Tetralogy of Fallot with repair in 1975, bradycardia status post pacemaker placement in 2002 which is now resolved but declined the extraction, diastolic CHF with preserved EF, previously 35%, atrial flutter status post ablation comes to the hospital with complains of chest pain, shortness of breath. Her symptoms started yesterday, and has been between sharp and pressure like pain with exertional dyspnea. No fevers or chill. This pain has been ongoing since April, but this time its more severe.   She has Cardiac CT scheduled at [facility] to evaluate her Pulm Valve on July 24th.   In the ER her routine blood work was unremarkable, 2 sets of troponin were negative.  EKG did not show any new ischemic changes, chest x-ray showed cardiomegaly without any evidence of pulmonary edema.  Cardiology team was consulted and patient is being admitted for overnight observation.     Review of Systems: As per HPI otherwise 10 point review of systems negative.  Review of Systems Otherwise negative except as per HPI, including: General: Denies fever, chills, night sweats or unintended weight loss. Resp: Denies wheezing Cardiac: Denies , orthopnea, paroxysmal nocturnal dyspnea. GI: Denies abdominal pain, nausea, vomiting, diarrhea or constipation GU: Denies dysuria, frequency, hesitancy or incontinence MS: Denies muscle aches, joint pain or swelling Neuro: Denies headache, neurologic deficits (focal weakness, numbness, tingling), abnormal gait Psych: Denies anxiety, depression, SI/HI/AVH Skin: Denies new rashes or lesions ID: Denies sick contacts, exotic exposures, travel  Past Medical History:  Diagnosis Date   CHF (congestive heart failure) (HCC) 2002    with pregnancy   Headache    Presence of permanent cardiac pacemaker    off for 2 years   Seizures (HCC) 2002   due to eclampsia   Sinus bradycardia    Tetralogy of Fallot s/p repair 1975    Past Surgical History:  Procedure Laterality Date   ABDOMINAL HYSTERECTOMY N/A 08/12/2014   Procedure: HYSTERECTOMY ABDOMINAL;  Surgeon: Jeani Hawking, MD;  Location: [facility];  Service: Gynecology;  Laterality: N/A;   CESAREAN SECTION     LAPAROSCOPIC ASSISTED VAGINAL HYSTERECTOMY N/A 08/12/2014   Procedure: ATTEMPTED LAPAROSCOPIC ASSISTED VAGINAL HYSTERECTOMY;  Surgeon: Jeani Hawking, MD;  Location: [facility];  Service: Gynecology;  Laterality: N/A;   PACEMAKER INSERTION     [employer]    RIGHT/LEFT HEART CATH AND CORONARY ANGIOGRAPHY N/A 07/01/2020   Procedure: RIGHT/LEFT HEART CATH AND CORONARY ANGIOGRAPHY;  Surgeon: Corky Crafts, MD;  Location: [facility] INVASIVE CV LAB;  Service: Cardiovascular;  Laterality: N/A;   SALPINGOOPHORECTOMY Bilateral 08/12/2014   Procedure: SALPINGO OOPHORECTOMY;  Surgeon: Jeani Hawking, MD;  Location: [facility];  Service: Gynecology;  Laterality: Bilateral;   SVT ABLATION N/A 08/21/2020   Procedure: SVT ABLATION;  Surgeon: Marinus Maw, MD;  Location: [facility] INVASIVE CV LAB;  Service: Cardiovascular;  Laterality: N/A;   TETRALOGY OF FALLOT REPAIR     TONSILLECTOMY      SOCIAL HISTORY:  reports that she has quit smoking. Her smoking use included cigarettes. She has never used smokeless tobacco. She reports current alcohol use. She reports that she does not use drugs.  Allergies  Allergen Reactions   Sulfonamide Derivatives Hives   Latex Rash    FAMILY HISTORY: History reviewed.  No pertinent family history.   Prior to Admission medications   Medication Sig Start Date End Date Taking? Authorizing Provider  ibuprofen (ADVIL) 200 MG tablet Take 400 mg by mouth every 8 (eight) hours as needed for mild pain or moderate pain (pain.).    [provider]  levocetirizine (XYZAL) 5 MG tablet Take by mouth daily at 6 (six) AM. 01/26/18   [provider]  metoprolol succinate (TOPROL-XL) 25 MG 24 hr tablet Take 0.5 tablets (12.5 mg total) by mouth daily. 12/02/22   Duke Salvia, MD  pantoprazole (PROTONIX) 40 MG tablet TAKE 1 TABLET(40 MG) BY MOUTH DAILY 03/29/22   Pricilla Riffle, MD  rosuvastatin (CRESTOR) 10 MG tablet Take 1 tablet (10 mg total) by mouth daily. 03/31/22   Pricilla Riffle, MD    Physical Exam: Vitals:   03/02/23 1330 03/02/23 1345 03/02/23 1430 03/02/23 1500  BP: (!) 112/47 (!) 92/57 103/64   Pulse: 82 71 69   Resp: (!) 21 18 20    Temp:    98.4 F (36.9 C)  TempSrc:    Oral  SpO2: 100% 98% 97%   Weight:      Height:          Constitutional: NAD, calm, comfortable Eyes: PERRL, lids and conjunctivae normal ENMT: Mucous membranes are moist. Posterior pharynx clear of any exudate or lesions.Normal dentition.  Neck: normal, supple, no masses, no thyromegaly Respiratory: clear to auscultation bilaterally, no wheezing, no crackles. Normal respiratory effort. No accessory muscle use.  Cardiovascular: Regular rate and rhythm, no murmurs / rubs / gallops. No extremity edema. 2+ pedal pulses. No carotid bruits.  Abdomen: no tenderness, no masses palpated. No hepatosplenomegaly. Bowel sounds positive.  Musculoskeletal: no clubbing / cyanosis. No joint deformity upper and lower extremities. Good ROM, no contractures. Normal muscle tone.  Skin: no rashes, lesions, ulcers. No induration Neurologic: CN 2-12 grossly intact. Sensation intact, DTR normal. Strength 5/5 in all 4.  Psychiatric: Normal judgment and insight. Alert and oriented x 3. Normal mood.     Labs on Admission: I have personally reviewed following labs and imaging studies  CBC: Recent Labs  Lab 03/02/23 1135  WBC 6.5  HGB 13.8  HCT 43.4  MCV 89.1  PLT 232   Basic Metabolic Panel: Recent Labs  Lab 03/02/23 1135  NA 141  K 4.0  CL 109   CO2 18*  GLUCOSE 73  BUN 16  CREATININE 0.79  CALCIUM 9.1   GFR: Estimated Creatinine Clearance: 89 mL/min (by C-G formula based on SCr of 0.79 mg/dL). Liver Function Tests: No results for input(s): "AST", "ALT", "ALKPHOS", "BILITOT", "PROT", "ALBUMIN" in the last 168 hours. No results for input(s): "LIPASE", "AMYLASE" in the last 168 hours. No results for input(s): "AMMONIA" in the last 168 hours. Coagulation Profile: No results for input(s): "INR", "PROTIME" in the last 168 hours. Cardiac Enzymes: No results for input(s): "CKTOTAL", "CKMB", "CKMBINDEX", "TROPONINI" in the last 168 hours. BNP (last 3 results) Recent Labs    03/29/22 1201  PROBNP 156   HbA1C: No results for input(s): "HGBA1C" in the last 72 hours. CBG: No results for input(s): "GLUCAP" in the last 168 hours. Lipid Profile: No results for input(s): "CHOL", "HDL", "LDLCALC", "TRIG", "CHOLHDL", "LDLDIRECT" in the last 72 hours. Thyroid Function Tests: No results for input(s): "TSH", "T4TOTAL", "FREET4", "T3FREE", "THYROIDAB" in the last 72 hours. Anemia Panel: No results for input(s): "VITAMINB12", "FOLATE", "FERRITIN", "TIBC", "IRON", "RETICCTPCT" in the last 72 hours. Urine analysis:  Component Value Date/Time   COLORURINE YELLOW 06/28/2018 1024   APPEARANCEUR CLEAR 06/28/2018 1024   LABSPEC 1.020 06/28/2018 1024   PHURINE 6.5 06/28/2018 1024   GLUCOSEU NEGATIVE 06/28/2018 1024   HGBUR NEGATIVE 06/28/2018 1024   BILIRUBINUR NEGATIVE 06/28/2018 1024   KETONESUR NEGATIVE 06/28/2018 1024   PROTEINUR NEGATIVE 06/28/2018 1024   NITRITE NEGATIVE 06/28/2018 1024   LEUKOCYTESUR MODERATE (A) 06/28/2018 1024   Sepsis Labs: !!!!!!!!!!!!!!!!!!!!!!!!!!!!!!!!!!!!!!!!!!!! @LABRCNTIP (procalcitonin:4,lacticidven:4) )No results found for this or any previous visit (from the past 240 hour(s)).   Radiological Exams on Admission: DG Chest 2 View  Result Date: 03/02/2023 CLINICAL DATA:  Chest pain EXAM: CHEST - 2  VIEW COMPARISON:  01/04/2021 FINDINGS: Transverse diameter of heart is increased. There are no signs of pulmonary edema or focal pulmonary consolidation. There is no pleural effusion or pneumothorax. Pacemaker battery is seen in the left infraclavicular region. IMPRESSION: Cardiomegaly. There are no signs of pulmonary edema or focal pulmonary consolidation. Electronically Signed   By: Ernie Avena M.D.   On: 03/02/2023 12:30     All images have been reviewed by me personally.  EKG: Independently reviewed. Non ischemic.   Assessment/Plan Principal Problem:   Chest pain Active Problems:   Essential hypertension, benign   SINUS BRADYCARDIA   CHF   Cardiac pacemaker in situ   Paroxysmal A-fib (HCC)   Atypical chest pain with shortness of breath - Given history of valvular issues and Tetralogy of Fallot, admit patient for overnight observation.  2 sets of troponin are negative.  EKG does not show any acute ischemic changes.  Cardiology team consulted.  Will defer any medication management to their service, Limited Echo. Check D Dimer, if elevated then can consider CTA Chest to rule out PE.   Paroxysmal atrial fibrillation/atrial tachycardia History of SVT -Denies any palpitations.  On Toprol-XL.  IV as needed.  Prior history of bradycardia status post pacemaker - Pacemaker is no longer functional.    GERD - PPI  Congestive heart failure with preserved ejection fraction History of tetralogy of Fallot with status post repair 1975 -Supportive care.  Medication management/GDMT per cardiology  DVT prophylaxis: Lovenox Code Status: Full code Family Communication: None Consults called: [facility] cardiology Admission status: Observation admission  Status is: Observation The patient remains OBS appropriate and will d/c before 2 midnights.   Time Spent: 65 minutes.  >50% of the time was devoted to discussing the patients care, assessment, plan and disposition with other care givers  along with counseling the patient about the risks and benefits of treatment.    Emir Nack Joline Maxcy MD [facility]  If 7PM-7AM, please contact night-coverage   03/02/2023, 3:59 PM

## 2023-03-02 NOTE — ED Provider Notes (Signed)
[place] EMERGENCY DEPARTMENT AT [facility] Provider Note   CSN: [id number] Arrival date & time: 03/02/23  1115     History  Chief Complaint  Patient presents with   Chest Pain   Shortness of Breath    Tonya Gardner is a 52 y.o. female PMHx TOF, CHF, and pacemaker who presents to ED concerned for SOB, chest pain, left arm tingling since 5pm last night. Worsens with exertion and mostly resolves with rest. History of these symptoms that usually resolve after a couple of minutes. Currently with Dr. Tenny Craw (Cardiology) for valve replacement surgery schedule pending CT heart imaging on 7/24.   Denies fever, nausea, vomiting   Chest Pain Associated symptoms: shortness of breath   Shortness of Breath Associated symptoms: chest pain        Home Medications Prior to Admission medications   Medication Sig Start Date End Date Taking? Authorizing Provider  ibuprofen (ADVIL) 200 MG tablet Take 400 mg by mouth every 8 (eight) hours as needed for mild pain or moderate pain (pain.).    [provider]  levocetirizine (XYZAL) 5 MG tablet Take by mouth daily at 6 (six) AM. 01/26/18   [provider]  metoprolol succinate (TOPROL-XL) 25 MG 24 hr tablet Take 0.5 tablets (12.5 mg total) by mouth daily. 12/02/22   Duke Salvia, MD  pantoprazole (PROTONIX) 40 MG tablet TAKE 1 TABLET(40 MG) BY MOUTH DAILY 03/29/22   Pricilla Riffle, MD  rosuvastatin (CRESTOR) 10 MG tablet Take 1 tablet (10 mg total) by mouth daily. 03/31/22   Pricilla Riffle, MD      Allergies    Sulfonamide derivatives and Latex    Review of Systems   Review of Systems  Respiratory:  Positive for shortness of breath.   Cardiovascular:  Positive for chest pain.    Physical Exam Updated Vital Signs BP 103/64   Pulse 69   Temp 98.4 F (36.9 C) (Oral)   Resp 20   Ht 5\' 2"  (1.575 m)   Wt 96.2 kg   LMP 11/20/2013   SpO2 97%   BMI 38.79 kg/m  Physical Exam Vitals and nursing note reviewed.   Constitutional:      General: She is not in acute distress.    Appearance: She is not ill-appearing or toxic-appearing.  HENT:     Head: Normocephalic and atraumatic.     Mouth/Throat:     Mouth: Mucous membranes are moist.     Pharynx: No oropharyngeal exudate or posterior oropharyngeal erythema.  Eyes:     General: No scleral icterus.       Right eye: No discharge.        Left eye: No discharge.     Conjunctiva/sclera: Conjunctivae normal.  Cardiovascular:     Rate and Rhythm: Normal rate and regular rhythm.     Pulses:          Radial pulses are 1+ on the right side and 1+ on the left side.       Dorsalis pedis pulses are 2+ on the right side and 2+ on the left side.     Heart sounds: Normal heart sounds. No murmur heard. Pulmonary:     Effort: Pulmonary effort is normal. No respiratory distress.     Breath sounds: Normal breath sounds. No wheezing, rhonchi or rales.  Abdominal:     Palpations: Abdomen is soft.  Musculoskeletal:     Right lower leg: No edema.  Left lower leg: No edema.  Skin:    General: Skin is warm and dry.     Findings: No rash.  Neurological:     General: No focal deficit present.     Mental Status: She is alert. Mental status is at baseline.  Psychiatric:        Mood and Affect: Mood normal.     ED Results / Procedures / Treatments   Labs (all labs ordered are listed, but only abnormal results are displayed) Labs Reviewed  BASIC METABOLIC PANEL - Abnormal; Notable for the following components:      Result Value   CO2 18 (*)    All other components within normal limits  CBC  HIV ANTIBODY (ROUTINE TESTING W REFLEX)  CBC  CREATININE, SERUM  BRAIN NATRIURETIC PEPTIDE  TROPONIN I (HIGH SENSITIVITY)  TROPONIN I (HIGH SENSITIVITY)    EKG EKG Interpretation Date/Time:  Wednesday March 02 2023 11:01:09 EDT Ventricular Rate:  71 PR Interval:  120 QRS Duration:  144 QT Interval:  420 QTC Calculation: 456 R Axis:   101  Text  Interpretation: Normal sinus rhythm Right bundle branch block Abnormal ECG When compared with ECG of 04-Jan-2021 14:39, PREVIOUS ECG IS PRESENT No significant change since last tracing Confirmed by Jacalyn Lefevre [phone]) on 03/02/2023 11:42:45 AM  Radiology DG Chest 2 View  Result Date: 03/02/2023 CLINICAL DATA:  Chest pain EXAM: CHEST - 2 VIEW COMPARISON:  01/04/2021 FINDINGS: Transverse diameter of heart is increased. There are no signs of pulmonary edema or focal pulmonary consolidation. There is no pleural effusion or pneumothorax. Pacemaker battery is seen in the left infraclavicular region. IMPRESSION: Cardiomegaly. There are no signs of pulmonary edema or focal pulmonary consolidation. Electronically Signed   By: Ernie Avena M.D.   On: 03/02/2023 12:30    Procedures Procedures    Medications Ordered in ED Medications  ipratropium-albuterol (DUONEB) 0.5-2.5 (3) MG/3ML nebulizer solution 3 mL (has no administration in time range)  ondansetron (ZOFRAN) injection 4 mg (has no administration in time range)  acetaminophen (TYLENOL) tablet 650 mg (has no administration in time range)  traZODone (DESYREL) tablet 50 mg (has no administration in time range)  senna-docusate (Senokot-S) tablet 1 tablet (has no administration in time range)  guaiFENesin (ROBITUSSIN) 100 MG/5ML liquid 5 mL (has no administration in time range)  oxyCODONE (Oxy IR/ROXICODONE) immediate release tablet 5 mg (has no administration in time range)  loratadine (CLARITIN) tablet 10 mg (has no administration in time range)  polyvinyl alcohol (LIQUIFILM TEARS) 1.4 % ophthalmic solution 1 drop (has no administration in time range)  hydrocortisone (ANUSOL-HC) 2.5 % rectal cream 1 Application (has no administration in time range)  alum & mag hydroxide-simeth (MAALOX/MYLANTA) 200-200-20 MG/5ML suspension 30 mL (has no administration in time range)  hydrocortisone cream 1 % 1 Application (has no administration in time  range)  Muscle Rub CREA 1 Application (has no administration in time range)  sodium chloride (OCEAN) 0.65 % nasal spray 1 spray (has no administration in time range)  phenol (CHLORASEPTIC) mouth spray 1 spray (has no administration in time range)  metoprolol tartrate (LOPRESSOR) injection 5 mg (has no administration in time range)  hydrALAZINE (APRESOLINE) injection 10 mg (has no administration in time range)  metoprolol succinate (TOPROL-XL) 24 hr tablet 12.5 mg (has no administration in time range)  rosuvastatin (CRESTOR) tablet 10 mg (has no administration in time range)  pantoprazole (PROTONIX) EC tablet 40 mg (has no administration in time range)  enoxaparin (LOVENOX) injection  40 mg (has no administration in time range)    ED Course/ Medical Decision Making/ A&P                             Medical Decision Making Amount and/or Complexity of Data Reviewed Labs: ordered. Radiology: ordered.   This patient presents to the ED for concern of chest pain, this involves an extensive number of treatment options, and is a complaint that carries with it a high risk of complications and morbidity.  The differential diagnosis includes acute coronary syndrome, congestive heart failure, pericarditis, pneumonia, pulmonary embolism, tension pneumothorax, esophageal rupture, aortic dissection, cardiac tamponade, musculoskeletal   Co morbidities that complicate the patient evaluation  TOF, CHF, pacemaker   Additional history obtained:  Additional history obtained from 12/2022 ECHO: Pulmonary valve with severe regurgitation   Lab Tests:  I Ordered, and personally interpreted labs.  The pertinent results include:  - Troponin: initial and repeat within normal limits - BMP: no concern for electrolyte abnormality; no concern for kidney damage - CBC: No concern for anemia or leukocytosis   Imaging Studies ordered:  I ordered imaging studies including  -chest xray: to assess for process  contributing to patients symptoms I independently visualized and interpreted imaging  I agree with the radiologist interpretation   Cardiac Monitoring: / EKG:  The patient was maintained on a cardiac monitor.  I personally viewed and interpreted the cardiac monitored which showed an underlying rhythm of: no acute changes from last EKG   Consultations Obtained:  I requested consultation with the Trish with Cardiology,  and discussed lab and imaging findings as well as pertinent plan - they agree to follow inpatient    Problem List / ED Course / Critical interventions / Medication management  Patient presented for chest pain, SOB, pre-syncopal symptoms, left arm tingling since 5PM last night. Hx severe pulmonary regurgitation seen on 12/2022 ECHO. Symptoms worse with exertion - better with rest. Symptoms usually resolve within a few minutes, but symptoms constant today. Physical exam unremarkable. Troponin within normal limits. EKG without acute changes. Chest xray with cardiomegaly without complications.  Admitting patient for symptomatic pulmonary regurgitation. Dr. Nelson Chimes admitting provider. Talked with Trish from cardiology who agrees to see patient. I have reviewed the patients home medicines and have made adjustments as needed   Ddx:  These are considered less likely due to history of present illness and physical exam findings.  Acute coronary syndrome: EKG and troponins within normal limits  Pericarditis: pain is not positional and patient denies orthopnea and recent illness Pneumonia: lungs are clear to auscultation bilaterally Pulmonary embolism: no recent surgeries, blood clot hx, hemoptysis, cancer hx, vitals stable Pneumothorax: lungs are clear to auscultation bilaterally Esophageal rupture: patient denies vomiting, heavy drinking, and hx of GERD Aortic dissection: vital signs are stable, no variation in pulse pressure    Social Determinants of  Health:  none          Final Clinical Impression(s) / ED Diagnoses Final diagnoses:  Nonrheumatic pulmonary valve insufficiency    Rx / DC Orders ED Discharge Orders     None         Dorthy Cooler, [place] 03/02/23 1553    Jacalyn Lefevre, MD 03/03/23 [phone]

## 2023-03-02 NOTE — Plan of Care (Signed)
Plan of care initiated and progressing 

## 2023-03-03 ENCOUNTER — Observation Stay (HOSPITAL_COMMUNITY): Payer: Managed Care, Other (non HMO)

## 2023-03-03 DIAGNOSIS — R0789 Other chest pain: Secondary | ICD-10-CM | POA: Diagnosis not present

## 2023-03-03 DIAGNOSIS — I371 Nonrheumatic pulmonary valve insufficiency: Secondary | ICD-10-CM | POA: Diagnosis not present

## 2023-03-03 DIAGNOSIS — Z950 Presence of cardiac pacemaker: Secondary | ICD-10-CM | POA: Diagnosis not present

## 2023-03-03 LAB — CBC
HCT: 40 % (ref 36.0–46.0)
Hemoglobin: 12.8 g/dL (ref 12.0–15.0)
MCH: 28.2 pg (ref 26.0–34.0)
MCHC: 32 g/dL (ref 30.0–36.0)
MCV: 88.1 fL (ref 80.0–100.0)
Platelets: 210 10*3/uL (ref 150–400)
RBC: 4.54 MIL/uL (ref 3.87–5.11)
RDW: 13.6 % (ref 11.5–15.5)
WBC: 5.1 10*3/uL (ref 4.0–10.5)
nRBC: 0 % (ref 0.0–0.2)

## 2023-03-03 LAB — BASIC METABOLIC PANEL
Anion gap: 10 (ref 5–15)
BUN: 15 mg/dL (ref 6–20)
CO2: 21 mmol/L — ABNORMAL LOW (ref 22–32)
Calcium: 8.7 mg/dL — ABNORMAL LOW (ref 8.9–10.3)
Chloride: 105 mmol/L (ref 98–111)
Creatinine, Ser: 0.87 mg/dL (ref 0.44–1.00)
GFR, Estimated: >60 mL/min (ref >60)
Glucose, Bld: 72 mg/dL (ref 70–99)
Potassium: 3.6 mmol/L (ref 3.5–5.1)
Sodium: 136 mmol/L (ref 135–145)

## 2023-03-03 LAB — ECHOCARDIOGRAM LIMITED
AR max vel: 1.66 cm2
AV Area VTI: 1.75 cm2
AV Area mean vel: 1.62 cm2
AV Mean grad: 8 mmHg
AV Peak grad: 13.4 mmHg
Ao pk vel: 1.83 m/s
Area-P 1/2: 4.15 cm2
Height: 62 in
S' Lateral: 2.7 cm
Single Plane A4C EF: 43 %
Weight: 3393.32 oz

## 2023-03-03 LAB — MAGNESIUM: Magnesium: 2.1 mg/dL (ref 1.7–2.4)

## 2023-03-03 MED ORDER — SACUBITRIL-VALSARTAN 24-26 MG PO TABS
1.0000 | ORAL_TABLET | Freq: Two times a day (BID) | ORAL | Status: DC
  Administered 2023-03-03: 1 via ORAL
  Filled 2023-03-03: qty 1

## 2023-03-03 NOTE — Progress Notes (Signed)
Echocardiogram 2D Echocardiogram has been performed.  Warren Lacy Donasia Wimes RDCS 03/03/2023, 2:47 PM

## 2023-03-03 NOTE — Progress Notes (Signed)
Mobility Specialist Progress Note:   03/03/23 1200  Therapy Vitals  Temp 98 F (36.7 C)  Temp Source Oral  BP (!) 108/58  Patient Position (if appropriate) Lying  Oxygen Therapy  O2 Device Room Air  Mobility  Activity Ambulated independently in hallway  Level of Assistance Independent  Assistive Device None  Distance Ambulated (ft) 240 ft  Activity Response Tolerated well  Mobility Referral Yes  $Mobility charge 1 Mobility  Mobility Specialist Start Time (ACUTE ONLY) 1208  Mobility Specialist Stop Time (ACUTE ONLY) 1221  Mobility Specialist Time Calculation (min) (ACUTE ONLY) 13 min    Pre Mobility: 59 HR,  106/55 BP,  100% SpO2 During Mobility: 80 HR Post Mobility:  61 HR, 100% SpO2  Pt received in bed, agreeable to mobility. Ambulated in hallway w/ no AD, supervised. C/o some dizziness after not ambulating in 2 days. Otherwise asymptomatic. Pt left in bed with call bell and family present.  D'Vante Earlene Plater Mobility Specialist Please contact via Special educational needs teacher or Rehab office at [phone]

## 2023-03-03 NOTE — Discharge Summary (Signed)
Physician Discharge Summary  Tonya Gardner:[id number] DOB: 10-16-1970 DOA: 03/02/2023  PCP: Oneita Hurt, No  Admit date: 03/02/2023 Discharge date: 03/03/2023  Admitted From: Home Disposition: Home  Recommendations for Outpatient Follow-up:  Follow up with PCP in 1-2 weeks Schedule follow-up with cardiothoracic surgery as you are doing  Home Health: N/A Equipment/Devices: N/A  Discharge Condition: Stable CODE STATUS: Full code Diet recommendation: Low-salt diet  Discharge summary: 52 year old with history of tetralogy of Fallot and repair in 1975, history of bradycardia status post pacemaker placement in 2002, battery life finished but declined extraction, diastolic heart failure, a flutter status post ablation came with more than 2 months of intermittent chest pain, discomfort, shortness of breath and fatigue.  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  Patient was admitted to rule out acute coronary syndrome.  Seen by cardiology in the hospital.  Troponins and subsequent troponin, EKG and subsequent EKG nonischemic.  Echocardiogram shows previous surgical structural heart disease as well as severe pulmonary regurgitation.  Patient remains without volume overload.  Her symptoms are likely related to pulmonary regurgitation and aggravated by exertion.  Since patient is stable and in need for surgical evaluation, she was discharged home to follow-up as outpatient. Resume all home medications.    Discharge Diagnoses:  Principal Problem:   Chest pain Active Problems:   Essential hypertension, benign   SINUS BRADYCARDIA   CHF   Cardiac pacemaker in situ   Paroxysmal A-fib [facility])    Discharge Instructions  Discharge Instructions     Diet - low sodium heart healthy   Complete by: As directed    Increase activity slowly   Complete by: As directed       Allergies as of 03/03/2023        Reactions   Sulfonamide Derivatives Hives   Latex Rash        Medication List     TAKE these medications    Entresto 24-26 MG Generic drug: sacubitril-valsartan Take 1 tablet by mouth 2 (two) times daily.   ibuprofen 200 MG tablet Commonly known as: ADVIL Take 400 mg by mouth every 8 (eight) hours as needed for mild pain or moderate pain (pain.).   levocetirizine 5 MG tablet Commonly known as: XYZAL Take 5 mg by mouth daily as needed for allergies.   metoprolol succinate 25 MG 24 hr tablet Commonly known as: TOPROL-XL Take 0.5 tablets (12.5 mg total) by mouth daily.   pantoprazole 40 MG tablet Commonly known as: PROTONIX TAKE 1 TABLET(40 MG) BY MOUTH DAILY   rosuvastatin 10 MG tablet Commonly known as: CRESTOR Take 1 tablet (10 mg total) by mouth daily.        Allergies  Allergen Reactions   Sulfonamide Derivatives Hives   Latex Rash    Consultations: Cardiology   Procedures/Studies: DG Chest 2 View  Result Date: 03/02/2023 CLINICAL DATA:  Chest pain EXAM: CHEST - 2 VIEW COMPARISON:  01/04/2021 FINDINGS: Transverse diameter of heart is increased. There are no signs of pulmonary edema or focal pulmonary consolidation. There is no pleural effusion or pneumothorax. Pacemaker battery is seen in the left infraclavicular region. IMPRESSION: Cardiomegaly. There are no signs of pulmonary edema or focal pulmonary consolidation. Electronically Signed   By: Ernie Avena M.D.   On: 03/02/2023 12:30   (Echo, Carotid, EGD, Colonoscopy, ERCP)    Subjective: Patient seen and examined before discharge.  Still has some fatigue and chest discomfort  on mobility, however at rest she denies any complaints.   Discharge Exam: Vitals:   03/03/23 0811 03/03/23 1200  BP: (!) 115/49 (!) 108/58  Pulse:    Resp:    Temp: 97.8 F (36.6 C) 98 F (36.7 C)  SpO2:     Vitals:   03/02/23 1909 03/03/23 0423 03/03/23 0811 03/03/23 1200  BP: (!) 99/58 (!) 101/43 (!) 115/49  (!) 108/58  Pulse: 73 71    Resp:  18    Temp: 99 F (37.2 C) 98 F (36.7 C) 97.8 F (36.6 C) 98 F (36.7 C)  TempSrc: Oral Oral Oral Oral  SpO2: 100% (!) 6%    Weight:      Height:        General: Pt is alert, awake, not in acute distress Cardiovascular: RRR, S1/S2 +, soft systolic murmur at pulmonary area. Respiratory: CTA bilaterally, no wheezing, no rhonchi Abdominal: Soft, NT, ND, bowel sounds + Extremities: no edema, no cyanosis    The results of significant diagnostics from this hospitalization (including imaging, microbiology, ancillary and laboratory) are listed below for reference.     Microbiology: No results found for this or any previous visit (from the past 240 hour(s)).   Labs: BNP (last 3 results) Recent Labs    03/02/23 1700  BNP 54.2   Basic Metabolic Panel: Recent Labs  Lab 03/02/23 1135 03/02/23 1700 03/03/23 0235  NA 141  --  136  K 4.0  --  3.6  CL 109  --  105  CO2 18*  --  21*  GLUCOSE 73  --  72  BUN 16  --  15  CREATININE 0.79 0.74 0.87  CALCIUM 9.1  --  8.7*  MG  --   --  2.1   Liver Function Tests: No results for input(s): "AST", "ALT", "ALKPHOS", "BILITOT", "PROT", "ALBUMIN" in the last 168 hours. No results for input(s): "LIPASE", "AMYLASE" in the last 168 hours. No results for input(s): "AMMONIA" in the last 168 hours. CBC: Recent Labs  Lab 03/02/23 1135 03/02/23 1700 03/03/23 0235  WBC 6.5 6.2 5.1  HGB 13.8 13.9 12.8  HCT 43.4 43.1 40.0  MCV 89.1 88.0 88.1  PLT 232 249 210   Cardiac Enzymes: No results for input(s): "CKTOTAL", "CKMB", "CKMBINDEX", "TROPONINI" in the last 168 hours. BNP: Invalid input(s): "POCBNP" CBG: No results for input(s): "GLUCAP" in the last 168 hours. D-Dimer Recent Labs    03/02/23 1813  DDIMER 0.27   Hgb A1c No results for input(s): "HGBA1C" in the last 72 hours. Lipid Profile No results for input(s): "CHOL", "HDL", "LDLCALC", "TRIG", "CHOLHDL", "LDLDIRECT" in the last 72  hours. Thyroid function studies No results for input(s): "TSH", "T4TOTAL", "T3FREE", "THYROIDAB" in the last 72 hours.  Invalid input(s): "FREET3" Anemia work up No results for input(s): "VITAMINB12", "FOLATE", "FERRITIN", "TIBC", "IRON", "RETICCTPCT" in the last 72 hours. Urinalysis    Component Value Date/Time   COLORURINE YELLOW 06/28/2018 1024   APPEARANCEUR CLEAR 06/28/2018 1024   LABSPEC 1.020 06/28/2018 1024   PHURINE 6.5 06/28/2018 1024   GLUCOSEU NEGATIVE 06/28/2018 1024   HGBUR NEGATIVE 06/28/2018 1024   BILIRUBINUR NEGATIVE 06/28/2018 1024   KETONESUR NEGATIVE 06/28/2018 1024   PROTEINUR NEGATIVE 06/28/2018 1024   NITRITE NEGATIVE 06/28/2018 1024   LEUKOCYTESUR MODERATE (A) 06/28/2018 1024   Sepsis Labs Recent Labs  Lab 03/02/23 1135 03/02/23 1700 03/03/23 0235  WBC 6.5 6.2 5.1   Microbiology No results found for this or any previous visit (from  the past 240 hour(s)).   Time coordinating discharge: 28 minutes  SIGNED:   Dorcas Carrow, MD  [facility] 03/03/2023, 1:16 PM

## 2023-03-03 NOTE — Progress Notes (Signed)
DAILY PROGRESS NOTE   Patient Name: Tonya Gardner Date of Encounter: 03/03/2023 Cardiologist: Dietrich Pates, MD  Chief Complaint   Chest pain, dyspnea  Patient Profile   Tonya Gardner is a 52 y.o. 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 Particia Nearing.   Subjective   Appears comfortable, laying supine. Work-up has so far been negative. D-dimer negative, trops negative, BNP normal, imaging normal - she says chest pain is constant and pulling. Says she is "exhausted" walking across the room. Plan for limited echo today with Peds/Adult sonographer to image the pulmonic valve and right heart. Noted to have some Wenkebach overnight, but sinus rhythm.  Objective   Vitals:   03/02/23 1730 03/02/23 1909 03/03/23 0423 03/03/23 0811  BP: 109/67 (!) 99/58 (!) 101/43 (!) 115/49  Pulse: 72 73 71   Resp: 17  18   Temp:  99 F (37.2 C) 98 F (36.7 C) 97.8 F (36.6 C)  TempSrc:  Oral Oral Oral  SpO2: 100% 100% (!) 6%   Weight:      Height:       No intake or output data in the 24 hours ending 03/03/23 1017 Filed Weights   03/02/23 1123  Weight: 96.2 kg    Physical Exam   General appearance: alert, no distress, and moderately obese Neck: no carotid bruit, no JVD, and thyroid not enlarged, symmetric, no tenderness/mass/nodules Lungs: clear to auscultation bilaterally Heart: regular rate and rhythm Abdomen: soft, non-tender; bowel sounds normal; no masses,  no organomegaly Extremities: extremities normal, atraumatic, no cyanosis or edema Pulses: 2+ and symmetric Skin: Skin color, texture, turgor normal. No rashes or lesions Neurologic: Grossly normal Psych:  Pleasant    Inpatient Medications    Scheduled Meds:  [START ON 03/12/2023] enoxaparin (LOVENOX) injection  50 mg Subcutaneous Q24H   metoprolol succinate  12.5 mg Oral Daily   pantoprazole  40 mg Oral QAC breakfast   rosuvastatin  10 mg Oral Daily   sacubitril-valsartan  1 tablet Oral BID    Continuous Infusions:   PRN Meds: acetaminophen, alum & mag hydroxide-simeth, guaiFENesin, hydrALAZINE, hydrocortisone, hydrocortisone cream, ipratropium-albuterol, loratadine, metoprolol tartrate, Muscle Rub, ondansetron (ZOFRAN) IV, oxyCODONE, phenol, polyvinyl alcohol, senna-docusate, sodium chloride, traZODone   Labs   Results for orders placed or performed during the hospital encounter of 03/02/23 (from the past 48 hour(s))  Basic metabolic panel     Status: Abnormal   Collection Time: 03/02/23 11:35 AM  Result Value Ref Range   Sodium 141 135 - 145 mmol/L   Potassium 4.0 3.5 - 5.1 mmol/L   Chloride 109 98 - 111 mmol/L   CO2 18 (L) 22 - 32 mmol/L   Glucose, Bld 73 70 - 99 mg/dL    Comment: Glucose reference range applies only to samples taken after fasting for at least 8 hours.   BUN 16 6 - 20 mg/dL   Creatinine, Ser 1.61 0.44 - 1.00 mg/dL   Calcium 9.1 8.9 - 09.6 mg/dL   GFR, Estimated >04 >54 mL/min    Comment: (NOTE) Calculated using the CKD-EPI Creatinine Equation (2021)    Anion gap 14 5 - 15  Comment: Performed at [facility], [address] [place], [place] [address]  CBC     Status: None   Collection Time: 03/02/23 11:35 AM  Result Value Ref Range   WBC 6.5 4.0 - 10.5 K/uL   RBC 4.87 3.87 - 5.11 MIL/uL   Hemoglobin 13.8 12.0 - 15.0 g/dL   HCT 60.4 54.0 - 98.1 %   MCV 89.1 80.0 - 100.0 fL   MCH 28.3 26.0 - 34.0 pg   MCHC 31.8 30.0 - 36.0 g/dL   RDW 19.1 47.8 - 29.5 %   Platelets 232 150 - 400 K/uL   nRBC 0.0 0.0 - 0.2 %    Comment: Performed at [facility], [address] [place], [place] [address]  Troponin I (High Sensitivity)     Status: None    Collection Time: 03/02/23 11:35 AM  Result Value Ref Range   Troponin I (High Sensitivity) 3 <18 ng/L    Comment: (NOTE) Elevated high sensitivity troponin I (hsTnI) values and significant  changes across serial measurements may suggest ACS but many other  chronic and acute conditions are known to elevate hsTnI results.  Refer to the "Links" section for chest pain algorithms and additional  guidance. Performed at [facility], [address], [place], [place] [address]   Troponin I (High Sensitivity)     Status: None   Collection Time: 03/02/23  2:47 PM  Result Value Ref Range   Troponin I (High Sensitivity) 3 <18 ng/L    Comment: (NOTE) Elevated high sensitivity troponin I (hsTnI) values and significant  changes across serial measurements may suggest ACS but many other  chronic and acute conditions are known to elevate hsTnI results.  Refer to the "Links" section for chest pain algorithms and additional  guidance. Performed at [facility], [address] [place], [place] [address]   HIV Antibody (routine testing w rflx)     Status: None   Collection Time: 03/02/23  5:00 PM  Result Value Ref Range   HIV Screen 4th Generation wRfx Non Reactive Non Reactive    Comment: Performed at [facility], [address], [place], [place] [address]  CBC     Status: None   Collection Time: 03/02/23  5:00 PM  Result Value Ref Range   WBC 6.2 4.0 - 10.5 K/uL   RBC 4.90 3.87 - 5.11 MIL/uL   Hemoglobin 13.9 12.0 - 15.0 g/dL   HCT 13.2 44.0 - 10.2 %   MCV 88.0 80.0 - 100.0 fL   MCH 28.4 26.0 - 34.0 pg   MCHC 32.3 30.0 - 36.0 g/dL   RDW 72.5 36.6 - 44.0 %   Platelets 249 150 - 400 K/uL   nRBC 0.0 0.0 - 0.2 %    Comment: Performed at [facility], [address] [place], [place] [address]  Creatinine, serum     Status: None   Collection Time: 03/02/23  5:00 PM  Result Value Ref Range   Creatinine, Ser 0.74 0.44 - 1.00 mg/dL   GFR, Estimated >59 >56 mL/min     Comment: (NOTE) Calculated using the CKD-EPI Creatinine Equation (2021) Performed at [facility], [address], [place], [place] [address]   Brain natriuretic peptide     Status: None   Collection Time: 03/02/23  5:00 PM  Result Value Ref Range   B Natriuretic Peptide 54.2 0.0 - 100.0 pg/mL    Comment: Performed  at [facility], [address] [place], [place] [address]  Troponin I (High Sensitivity)     Status: None   Collection Time: 03/02/23  5:00 PM  Result Value Ref Range   Troponin I (High Sensitivity) 4 <18 ng/L    Comment: (NOTE) Elevated high sensitivity troponin I (hsTnI) values and significant  changes across serial measurements may suggest ACS but many other  chronic and acute conditions are known to elevate hsTnI results.  Refer to the "Links" section for chest pain algorithms and additional  guidance. Performed at [facility], [address], [place], [place] [address]   D-dimer, quantitative     Status: None   Collection Time: 03/02/23  6:13 PM  Result Value Ref Range   D-Dimer, Quant 0.27 0.00 - 0.50 ug/mL-FEU    Comment: (NOTE) At the manufacturer cut-off value of 0.5 g/mL FEU, this assay has a negative predictive value of 95-100%.This assay is intended for use in conjunction with a clinical pretest probability (PTP) assessment model to exclude pulmonary embolism (PE) and deep venous thrombosis (DVT) in outpatients suspected of PE or DVT. Results should be correlated with clinical presentation. Performed at [facility], [address], [place], [place] [address]   Basic metabolic panel     Status: Abnormal   Collection Time: 03/03/23  2:35 AM  Result Value Ref Range   Sodium 136 135 - 145 mmol/L   Potassium 3.6 3.5 - 5.1 mmol/L   Chloride 105 98 - 111 mmol/L   CO2 21 (L) 22 - 32 mmol/L   Glucose, Bld 72 70 - 99 mg/dL    Comment: Glucose reference range applies only to samples taken after fasting for at least 8 hours.   BUN  15 6 - 20 mg/dL   Creatinine, Ser 8.46 0.44 - 1.00 mg/dL   Calcium 8.7 (L) 8.9 - 10.3 mg/dL   GFR, Estimated >96 >29 mL/min    Comment: (NOTE) Calculated using the CKD-EPI Creatinine Equation (2021)    Anion gap 10 5 - 15    Comment: Performed at [facility], [address] [place], [place] [address]  CBC     Status: None   Collection Time: 03/03/23  2:35 AM  Result Value Ref Range   WBC 5.1 4.0 - 10.5 K/uL   RBC 4.54 3.87 - 5.11 MIL/uL   Hemoglobin 12.8 12.0 - 15.0 g/dL   HCT 32.4 40.1 - 02.7 %   MCV 88.1 80.0 - 100.0 fL   MCH 28.2 26.0 - 34.0 pg   MCHC 32.0 30.0 - 36.0 g/dL   RDW 25.3 66.4 - 40.3 %   Platelets 210 150 - 400 K/uL   nRBC 0.0 0.0 - 0.2 %    Comment: Performed at [facility], [address], [place], [place] [address]  Magnesium     Status: None   Collection Time: 03/03/23  2:35 AM  Result Value Ref Range   Magnesium 2.1 1.7 - 2.4 mg/dL    Comment: Performed at [facility], [address] [place], [place] [address]    ECG   NSR with RBBB at 71 - Personally Reviewed  Telemetry   Sinus rhythm with missed beats likely Wenkebach) - Personally Reviewed  Radiology    DG Chest 2 View  Result Date: 03/02/2023 CLINICAL DATA:  Chest pain EXAM: CHEST - 2 VIEW COMPARISON:  01/04/2021 FINDINGS: Transverse diameter of heart is increased. There are no  signs of pulmonary edema or focal pulmonary consolidation. There is no pleural effusion or pneumothorax. Pacemaker battery is seen in the left infraclavicular region. IMPRESSION: Cardiomegaly. There are no signs of pulmonary edema or focal pulmonary consolidation. Electronically Signed   By: Ernie Avena M.D.   On: 03/02/2023 12:30    Cardiac Studies   Echo pending  Assessment   Principal Problem:   Chest pain Active Problems:   Essential hypertension, benign   SINUS BRADYCARDIA   CHF   Cardiac pacemaker in situ   Paroxysmal A-fib (HCC)   Plan   Chest pain is constant and likely  non-cardiac. Ruled out for MI and PE. Don't suspect symptoms related to HF or pulmonary congestion. BNP normal. Limited echo to better visualize pulmonic valve today- in the absence of any new significant changes, would likely recommend outpatient follow-up with Dr. Mayo Ao at [facility] to continue work-up for severe PR in the setting of prior TOF repair.  Time Spent Directly with Patient:  I have spent a total of 25 minutes with the patient reviewing hospital notes, telemetry, EKGs, labs and examining the patient as well as establishing an assessment and plan that was discussed personally with the patient.  > 50% of time was spent in direct patient care.  Length of Stay:  LOS: 0 days   Chrystie Nose, MD, [facility], FACP  [place]  [facility]  Medical Director of the Advanced Lipid Disorders &  Cardiovascular Risk Reduction Clinic Diplomate of the American Board of Clinical Lipidology Attending Cardiologist  Direct Dial: [phone]  Fax: [phone]  Website:  [email or web address] Nicely Desmond Tufano 03/03/2023, 10:17 AM

## 2023-03-03 NOTE — Plan of Care (Signed)
  Problem: Education: Goal: Knowledge of General Education information will improve Description: Including pain rating scale, medication(s)/side effects and non-pharmacologic comfort measures Outcome: Adequate for Discharge   Problem: Clinical Measurements: Goal: Ability to maintain clinical measurements within normal limits will improve Outcome: Adequate for Discharge Goal: Will remain free from infection Outcome: Adequate for Discharge Goal: Diagnostic test results will improve Outcome: Adequate for Discharge Goal: Respiratory complications will improve Outcome: Adequate for Discharge Goal: Cardiovascular complication will be avoided Outcome: Adequate for Discharge   Problem: Activity: Goal: Risk for activity intolerance will decrease Outcome: Adequate for Discharge   Problem: Nutrition: Goal: Adequate nutrition will be maintained Outcome: Adequate for Discharge   

## 2023-03-19 ENCOUNTER — Other Ambulatory Visit: Payer: Self-pay | Admitting: Internal Medicine

## 2023-04-14 ENCOUNTER — Telehealth: Payer: Self-pay

## 2023-04-14 ENCOUNTER — Other Ambulatory Visit: Payer: Self-pay

## 2023-04-14 ENCOUNTER — Other Ambulatory Visit: Payer: Self-pay | Admitting: Internal Medicine

## 2023-04-14 MED ORDER — ROSUVASTATIN CALCIUM 10 MG PO TABS: 10.0000 mg | ORAL_TABLET | Freq: Every day | ORAL | 2 refills | Status: DC

## 2023-04-14 NOTE — Telephone Encounter (Signed)
Need f/u in one month per Dr Tenny Craw.

## 2023-04-20 ENCOUNTER — Encounter: Payer: Self-pay | Admitting: Internal Medicine

## 2023-04-20 NOTE — Telephone Encounter (Signed)
I spoke with the pt and reassured her that I will work her in to be seen in the next week or so.

## 2023-04-26 NOTE — Progress Notes (Signed)
New Patient Office Visit  Subjective:   Tonya Gardner May 05, 1971 04/27/2023  Chief Complaint  Patient presents with   New Patient (Initial Visit)    Pt is here to get established with the practice. States it has been years since she has had a PCP.    HPI: Tonya Gardner presents today to establish care at [facility] at [facility]. Introduced to Publishing rights manager role and practice setting.  All questions answered.   Last PCP: unknown; several years ago Last annual physical: Several years (5+)   Concerns: See below   She had transcatheter pulmonary valve replacement 2 weeks ago at [facility] on 04/12/23.Marland Kitchen  She has a history of tetralogy of Fallot with repair in 1975 and systolic/diastolic heart failure NYHA class II, a flutter status post ablation December 2021, and severe pulmonary insufficiency.  She is managed by cardiology and has a follow-up in the next 1-2 weeks post transcatheter pulmonary valve replacement.  Patient states she is doing well overall and feels much better after valve replacement.  She is due for several health maintenance items and would like to return for AE and Labs.    The following portions of the patient's history were reviewed and updated as appropriate: past medical history, past surgical history, family history, social history, allergies, medications, and problem list.   Patient Active Problem List   Diagnosis Date Noted   Chest pain 03/02/2023   Abnormal echocardiogram    Shortness of breath    Paroxysmal A-fib (HCC) 06/02/2020   Supraventricular tachycardia 06/02/2020   History of CHF (congestive heart failure) 06/02/2020   Hyperglycemia 06/02/2020   Obesity (BMI 30-39.9) 06/02/2020   History of tetralogy of Fallot 06/02/2020   Menorrhagia 08/12/2014   Essential hypertension, benign 10/16/2009   Cardiac pacemaker in situ 10/16/2009   SINUS BRADYCARDIA 10/13/2009   CHF 10/13/2009   Past Medical  History:  Diagnosis Date   Allergy    Sulpha drugs, latex   Anxiety    CHF (congestive heart failure) (HCC) 08/23/2000   with pregnancy   GERD (gastroesophageal reflux disease)    Headache    Heart murmur    Presence of permanent cardiac pacemaker    off for 2 years   Seizures (HCC) 08/23/2000   due to eclampsia   Sinus bradycardia    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   Past Surgical History:  Procedure Laterality Date   ABDOMINAL HYSTERECTOMY N/A 08/12/2014   Procedure: HYSTERECTOMY ABDOMINAL;  Surgeon: Jeani Hawking, MD;  Location: [facility];  Service: Gynecology;  Laterality: N/A;   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   Pulmonary   CESAREAN SECTION     LAPAROSCOPIC ASSISTED VAGINAL HYSTERECTOMY N/A 08/12/2014   Procedure: ATTEMPTED LAPAROSCOPIC ASSISTED VAGINAL HYSTERECTOMY;  Surgeon: Jeani Hawking, MD;  Location: [facility];  Service: Gynecology;  Laterality: N/A;   PACEMAKER INSERTION     Medtronic    RIGHT/LEFT HEART CATH AND CORONARY ANGIOGRAPHY N/A 07/01/2020   Procedure: RIGHT/LEFT HEART CATH AND CORONARY ANGIOGRAPHY;  Surgeon: Corky Crafts, MD;  Location: [facility] INVASIVE CV LAB;  Service: Cardiovascular;  Laterality: N/A;   SALPINGOOPHORECTOMY Bilateral 08/12/2014   Procedure: SALPINGO OOPHORECTOMY;  Surgeon: Jeani Hawking, MD;  Location: [facility];  Service: Gynecology;  Laterality: Bilateral;   SVT ABLATION N/A 08/21/2020   Procedure: SVT ABLATION;  Surgeon: Marinus Maw, MD;  Location: [facility] INVASIVE CV LAB;  Service: Cardiovascular;  Laterality: N/A;  TETRALOGY OF FALLOT REPAIR     TONSILLECTOMY     TUBAL LIGATION  2002   Family History  Problem Relation Age of Onset   Drug abuse Mother    Heart disease Mother    Alcohol abuse Father    Heart disease Father    Arthritis Maternal Grandmother    Anxiety disorder Daughter    Social History   Socioeconomic History   Marital status: Divorced    Spouse name: Not on file   Number of  children: Not on file   Years of education: Not on file   Highest education level: Associate degree: academic program  Occupational History   Not on file  Tobacco Use   Smoking status: Former    Current packs/day: 0.00    Types: Cigarettes   Smokeless tobacco: Never  Vaping Use   Vaping status: Never Used  Substance and Sexual Activity   Alcohol use: Yes    Alcohol/week: 1.0 standard drink of alcohol    Types: 1 Standard drinks or equivalent per week    Comment: occasionally   Drug use: No   Sexual activity: Not Currently    Birth control/protection: Abstinence, Other-see comments, None    Comment: Full hysterectomy  Other Topics Concern   Not on file  Social History Narrative   Not on file   Social Determinants of Health   Financial Resource Strain: Low Risk  (04/20/2023)   Overall Financial Resource Strain (CARDIA)    Difficulty of Paying Living Expenses: Not very hard  Food Insecurity: No Food Insecurity (04/20/2023)   Hunger Vital Sign    Worried About Running Out of Food in the Last Year: Never true    Ran Out of Food in the Last Year: Never true  Transportation Needs: No Transportation Needs (04/20/2023)   PRAPARE - Administrator, Civil Service (Medical): No    Lack of Transportation (Non-Medical): No  Physical Activity: Unknown (04/20/2023)   Exercise Vital Sign    Days of Exercise per Week: 0 days    Minutes of Exercise per Session: Not on file  Stress: Stress Concern Present (04/20/2023)   [employer] of Occupational Health - Occupational Stress Questionnaire    Feeling of Stress : To some extent  Social Connections: Socially Isolated (04/20/2023)   Social Connection and Isolation Panel [NHANES]    Frequency of Communication with Friends and Family: More than three times a week    Frequency of Social Gatherings with Friends and Family: Once a week    Attends Religious Services: Never    Database administrator or Organizations: No    Attends  Engineer, structural: Not on file    Marital Status: Divorced  Catering manager Violence: Not on file   Outpatient Medications Prior to Visit  Medication Sig Dispense Refill   aspirin EC 81 MG tablet Take 81 mg by mouth daily.     ENTRESTO 24-26 MG Take 1 tablet by mouth 2 (two) times daily.     ibuprofen (ADVIL) 200 MG tablet Take 400 mg by mouth every 8 (eight) hours as needed for mild pain or moderate pain (pain.).     levocetirizine (XYZAL) 5 MG tablet Take 5 mg by mouth daily as needed for allergies.     metoprolol succinate (TOPROL-XL) 25 MG 24 hr tablet Take 1 tablet by mouth at bedtime.     pantoprazole (PROTONIX) 40 MG tablet TAKE 1 TABLET(40 MG) BY MOUTH DAILY 90 tablet 3  rosuvastatin (CRESTOR) 10 MG tablet Take 1 tablet (10 mg total) by mouth daily. 90 tablet 2   metoprolol succinate (TOPROL-XL) 25 MG 24 hr tablet Take 0.5 tablets (12.5 mg total) by mouth daily. (Patient taking differently: Take 25 mg by mouth daily.) 45 tablet 2   No facility-administered medications prior to visit.   Allergies  Allergen Reactions   Sulfonamide Derivatives Hives   Latex Rash    ROS: A complete ROS was performed with pertinent positives/negatives noted in the HPI. The remainder of the ROS are negative.   Objective:   Today's Vitals   04/27/23 0856  BP: 109/68  Pulse: 74  SpO2: 98%  Weight: 205 lb 8 oz (93.2 kg)  Height: 5\' 2"  (1.575 m)    GENERAL: Well-appearing, in NAD. Well nourished.  SKIN: Pink, warm and dry. No rash, lesion, ulceration, or ecchymoses.  Head: Normocephalic. NECK: Trachea midline. Full ROM w/o pain or tenderness. No lymphadenopathy.  RESPIRATORY: Chest wall symmetrical. Respirations even and non-labored. Breath sounds clear to auscultation bilaterally.  CARDIAC: S1, S2 present, regular rate and rhythm with Grade II systolic murmur. No gallops. Peripheral pulses 2+ bilaterally. Carotid arteries without bruit or thrill.  MSK: Muscle tone and strength  appropriate for age. Joints w/o tenderness, redness, or swelling.  EXTREMITIES: Without clubbing, cyanosis, or edema.  NEUROLOGIC: No motor or sensory deficits. Steady, even gait. C2-C12 intact.  PSYCH/MENTAL STATUS: Alert, oriented x 3. Cooperative, appropriate mood and affect.    Health Maintenance Due  Topic Date Due   COVID-19 Vaccine (1) Never done   Zoster Vaccines- Shingrix (1 of 2) Never done   Colonoscopy  Never done   MAMMOGRAM  Never done    No results found for any visits on 04/27/23.     Assessment & Plan:  1. Screening for colon cancer Referral placed to [facility] for routine csope. No personal or family hx of colorectal cancer.  - Ambulatory referral to Gastroenterology  2. Breast cancer screening by mammogram - MM 3D SCREENING MAMMOGRAM BILATERAL BREAST; Future  3. S/P transcatheter replacement of pulmonary valve Doing well per patient. Will follow up with Cardiology as scheduled at this time. Will return in 4 weeks for AE and labs.   Patient to reach out to office if new, worrisome, or unresolved symptoms arise or if no improvement in patient's condition. Patient verbalized understanding and is agreeable to treatment plan. All questions answered to patient's satisfaction.    Return in about 4 weeks (around 05/25/2023) for ANNUAL PHYSICAL (labs at appt) .   Of note, portions of this note may have been created with voice recognition software Physicist, medical). While this note has been edited for accuracy, occasional wrong-word or 'sound-a-like' substitutions may have occurred due to the inherent limitations of voice recognition software.  Yolanda Manges, FNP

## 2023-04-27 ENCOUNTER — Encounter (HOSPITAL_BASED_OUTPATIENT_CLINIC_OR_DEPARTMENT_OTHER): Payer: Self-pay | Admitting: Family Medicine

## 2023-04-27 ENCOUNTER — Ambulatory Visit (INDEPENDENT_AMBULATORY_CARE_PROVIDER_SITE_OTHER): Payer: Managed Care, Other (non HMO) | Admitting: Family Medicine

## 2023-04-27 VITALS — BP 109/68 | HR 74 | Ht 62.0 in | Wt 205.5 lb

## 2023-04-27 DIAGNOSIS — Z1231 Encounter for screening mammogram for malignant neoplasm of breast: Secondary | ICD-10-CM

## 2023-04-27 DIAGNOSIS — Z1211 Encounter for screening for malignant neoplasm of colon: Secondary | ICD-10-CM | POA: Diagnosis not present

## 2023-04-27 DIAGNOSIS — Z954 Presence of other heart-valve replacement: Secondary | ICD-10-CM | POA: Diagnosis not present

## 2023-04-27 NOTE — Telephone Encounter (Signed)
Offered pt cancellation spot today at 4:20 om but she is unable to make that time.. will work her in when Dr Tenny Craw adds in.

## 2023-04-27 NOTE — Patient Instructions (Signed)
Check with Cardiologist concerning your Shingrix and Flu vaccines.

## 2023-04-27 NOTE — Telephone Encounter (Signed)
See phone note pt unable to make a cancellation that we had for today.. will continue to get her worked in another day.

## 2023-05-02 NOTE — Telephone Encounter (Signed)
Appt made with Dr Tenny Craw 05/06/23

## 2023-05-05 NOTE — Progress Notes (Unsigned)
Cardiology Office Note   Date:  05/06/2023   ID:  Tonya Gardner, DOB 1971/05/17, MRN [id number]  PCP:  Hilbert Bible, FNP  Cardiologist:   Dietrich Pates, MD  EP:   Lewayne Bunting, MD  Pt presents for follow up evaluation.     History of Present Illness: Tonya Gardner is a 52 y.o. female with a history of Tetralogy of Fallot (s/p repair in 1975), hx severe bradycardia (2002, in setting of preeclampsia/eclampsia; s/p PPM; battery at end of life 2012; pt has declined extraction).  In 2021 the pt complained of SOB   Echo LVEF 35 to 40%  RVEF modeately reduced   SHe underwent  R and L heart catheterization in Nov 2021.   This showed normal Pressures, normal coronary arteries      She was placed on Entresto at that time   In Dec 2021 he pt underwent ablation of atrial tach x 2 and atrial flutter      ON review of repeat echo in Feb  2022 LVEF 45 to 50%, RVEF mild to moderately depressed.  PI appeared at least moderate  I saw the pt in Aug  2023 At that time she was recovering from a URI   She also was very busy with new job, going through a divorce. SHe complained of some dizziness.   Repeat echo LVEF appeared normal, RVEF mildly depressed; PI severe Unfort with PPM not able to do MRI.   Plan was to continue to follow closely Since seen, the pt continues to complain not feeling good   Fatigued, gives out   Denies CP   No presyncope/syncope    BP is low at home          I last saw the pt in April 2024  I referred to to [facility] Rozelle Logan, adult congenital cardiology)   Te pt went on to ave a armony valve placed in the pulmonic position.   Eco postr procedure soted trivial PI   Mean gradient through the RVOT was 8 mm Hg.   There was mild to moderate TR   Peak radient 45 mm Hg  Normal LV and RV function .     Pt says sincd the procedure she feels great.    Breagthing is good   DOes get tired with walkin   HR is higher than it has been   Goes from 40s to 120s  not doing anything different   Not  felling dizzy    Going to start working out    Current Meds  Medication Sig   aspirin EC 81 MG tablet Take 81 mg by mouth daily.   ENTRESTO 24-26 MG Take 1 tablet by mouth 2 (two) times daily.   ibuprofen (ADVIL) 200 MG tablet Take 400 mg by mouth every 8 (eight) hours as needed for mild pain or moderate pain (pain.).   levocetirizine (XYZAL) 5 MG tablet Take 5 mg by mouth daily as needed for allergies.   metoprolol succinate (TOPROL-XL) 25 MG 24 hr tablet Take 1 tablet by mouth at bedtime.   pantoprazole (PROTONIX) 40 MG tablet TAKE 1 TABLET(40 MG) BY MOUTH DAILY   rosuvastatin (CRESTOR) 10 MG tablet Take 1 tablet (10 mg total) by mouth daily.     Allergies:   Sulfonamide derivatives and Latex   Past Medical History:  Diagnosis Date   Allergy    Sulpha drugs, latex   Anxiety    CHF (congestive heart failure) (HCC)  08/23/2000   with pregnancy   GERD (gastroesophageal reflux disease)    Headache    Heart murmur    Presence of permanent cardiac pacemaker    off for 2 years   Seizures (HCC) 08/23/2000   due to eclampsia   Sinus bradycardia    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    Past Surgical History:  Procedure Laterality Date   ABDOMINAL HYSTERECTOMY N/A 08/12/2014   Procedure: HYSTERECTOMY ABDOMINAL;  Surgeon: Jeani Hawking, MD;  Location: [facility];  Service: Gynecology;  Laterality: N/A;   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   Pulmonary   CESAREAN SECTION     LAPAROSCOPIC ASSISTED VAGINAL HYSTERECTOMY N/A 08/12/2014   Procedure: ATTEMPTED LAPAROSCOPIC ASSISTED VAGINAL HYSTERECTOMY;  Surgeon: Jeani Hawking, MD;  Location: [facility];  Service: Gynecology;  Laterality: N/A;   PACEMAKER INSERTION     Medtronic    RIGHT/LEFT HEART CATH AND CORONARY ANGIOGRAPHY N/A 07/01/2020   Procedure: RIGHT/LEFT HEART CATH AND CORONARY ANGIOGRAPHY;  Surgeon: Corky Crafts, MD;  Location: [facility] INVASIVE CV LAB;  Service: Cardiovascular;  Laterality: N/A;    SALPINGOOPHORECTOMY Bilateral 08/12/2014   Procedure: SALPINGO OOPHORECTOMY;  Surgeon: Jeani Hawking, MD;  Location: [facility];  Service: Gynecology;  Laterality: Bilateral;   SVT ABLATION N/A 08/21/2020   Procedure: SVT ABLATION;  Surgeon: Marinus Maw, MD;  Location[facility] INVASIVE CV LAB;  Service: Cardiovascular;  Laterality: N/A;   TETRALOGY OF FALLOT REPAIR     TONSILLECTOMY     TUBAL LIGATION  2002     Social History:  The patient  reports that she has quit smoking. Her smoking use included cigarettes. She has never used smokeless tobacco. She reports current alcohol use of about 1.0 standard drink of alcohol per week. She reports that she does not use drugs.   Family History:  The patient's family history includes Alcohol abuse in her father; Anxiety disorder in her daughter; Arthritis in her maternal grandmother; Drug abuse in her mother; Heart disease in her father and mother.    ROS:  Please see the history of present illness. All other systems are reviewed and  Negative to the above problem except as noted.    PHYSICAL EXAM: VS:  BP 124/80 (BP Location: Left Arm, Patient Position: Sitting, Cuff Size: Large)   Pulse 82   Ht 5\' 2"  (1.575 m)   Wt 208 lb 6.4 oz (94.5 kg)   LMP 11/20/2013   SpO2 97%   BMI 38.12 kg/m   GEN:  Obese 52 yo  in no acute distress  HEENT: normal  Neck: JVP is normal  Cardiac: RRR; no murmurs     Respiratory:  clear to auscultation GI: soft, nontender, Ext   No LE edema  R groin with cath puncture site   Healing   No evid for infection   No bruit   L groin without hematoma  Well healed      EKG:  EKG shows SB 52 bpm   RBBB  QRS 142 msec     Echo AUg 2023  1. S/P repair of tetralogy of fallot; septal hypokinesis with preserved  LV function; D shaped septum; moderate RVE with mild RV dysfunction at  apex; mild RAE; mild PS; severe PI.   2. Left ventricular ejection fraction, by estimation, is 55 to 60%. The  left ventricle has normal  function. The left ventricle has no regional  wall motion abnormalities. Left ventricular diastolic parameters were  normal. There  is the interventricular  septum is flattened in systole and diastole, consistent with right  ventricular pressure and volume overload.   3. Right ventricular systolic function is mildly reduced. The right  ventricular size is moderately enlarged. There is normal pulmonary artery  systolic pressure.   4. Right atrial size was mildly dilated.   5. The mitral valve is normal in structure. No evidence of mitral valve  regurgitation. No evidence of mitral stenosis.   6. The aortic valve is normal in structure. Aortic valve regurgitation is  trivial. No aortic stenosis is present.   7. Pulmonic valve regurgitation is severe. Mild pulmonic stenosis.   8. The inferior vena cava is normal in size with greater than 50%  respiratory variability, suggesting right atrial pressure of 3 mmHg.  Echo  10/03/20  Left ventricular ejection fraction, by estimation, is 45 to 50%. The left ventricle has mildly decreased function. The left ventricle demonstrates global hypokinesis. Left ventricular diastolic parameters are indeterminate. There is the interventricular septum is flattened in diastole ('D' shaped left ventricle), consistent with right ventricular volume overload. 2. Right ventricular systolic function is moderately reduced. The right ventricular size is mildly enlarged. 3. Right atrial size was mildly dilated. 4. The mitral valve is normal in structure. Mild mitral valve regurgitation. No evidence of mitral stenosis. 5. The aortic valve is normal in structure. Aortic valve regurgitation is trivial. No aortic stenosis is present. 6. The inferior vena cava is normal in size with greater than 50% respiratory variability, suggesting right atrial pressure of 3 mmHg. Echo  06/02/20  1. Poor acoustic windows limiit. 2. Left ventricular ejection fraction, by estimation, is  35 to 40%. The left ventricle has moderately decreased function. The left ventricle demonstrates global hypokinesis. There is mild left ventricular hypertrophy. Left ventricular diastolic parameters are consistent with Grade III diastolic dysfunction (restrictive). 3. Right ventricular systolic function moderate to severely reduced. The right ventricular size is moderately enlarged. 4. Left atrial size was moderately dilated. 5. Right atrial size was moderately dilated. 6. The mitral valve is normal in structure. Moderate mitral valve regurgitation. 7. The aortic valve is normal in structure. Aortic valve regurgitation is not visualized. 8. The inferior vena cava is normal in size with <50% respiratory variability, suggesting right atrial pressure of 8 mmHg.  Limited echo 10.12.21  Limited study. 2. Left ventricular ejection fraction, by estimation, is 35 to 40%. The left ventricle has moderately decreased function. The left ventricle demonstrates global hypokinesis. No obvious LV mural thrombus. 3. Right ventricular systolic function is moderately reduced. The right ventricular size is moderately enlarged. Mildly increased right ventricular wall thickness. 4. The inferior vena cava is normal in size with greater than 50% respiratory variability, suggesting right atrial pressure of 3 mmHg.  R and L heart cath 07/01/20 No left main. Separate ostia of LAD and circumflex. No angiographically apparent CAD. There is mild to moderate left ventricular systolic dysfunction. LV end diastolic pressure is normal. The left ventricular ejection fraction is 35-45% by visual estimate. There is no aortic valve stenosis. Ao 95%, PA 73%, PA pressure 21/6, mean PA pressure 12 mm Hg; PCWP 9 mm Hg; CO 5.9 L/min; CI 3.29 Normal right heart pressures.   Continue preventive therapy. Nonischemic cardiomyopathy.      Lipid Panel No results found for: "CHOL", "TRIG", "HDL", "CHOLHDL", "VLDL", "LDLCALC",  "LDLDIRECT"    Wt Readings from Last 3 Encounters:  05/06/23 208 lb 6.4 oz (94.5 kg)  04/27/23 205 lb 8 oz (93.2  kg)  03/02/23 212 lb 1.3 oz (96.2 kg)      ASSESSMENT AND PLAN:   1  Pulmonic insufficiency   PT with severe PI (s/p TOF repair in 1975)  She is now 3 wks s/p [place] valve placement  at [facility]   Echo post procedure valve functioning normally    LVEF and RVEF normal  Follow       2  Palpitations   Pt is s/p SVT ablation in the past   She says since procedure she will have wide variations in HR even when she is sitting   No dizziness      If continues would set up for Zio patch     Follow for now   Keep on Toprol  3  Hx HFrEF    Pt on  3   Hx bradycardia   Isolated   In setting of eclampsia in past    It has been nonfunctional    4  Lipids   Pt on statin  LDL 62  HDL 67  Trig 103     5.   Metabolics    REviewed diet  REcomm whole, minimally processed foods   Low in carbs Stay active  2 min walking every 30 min     Current medicines are reviewed at length with the patient today.  The patient does not have concerns regarding medicines.  Signed, Dietrich Pates, MD  05/06/2023 8:34 AM    [facility] [address] [place], [place], [place]  [address] Phone: [phone]; Fax: [phone]

## 2023-05-06 ENCOUNTER — Ambulatory Visit: Payer: Managed Care, Other (non HMO) | Attending: Internal Medicine | Admitting: Internal Medicine

## 2023-05-06 ENCOUNTER — Other Ambulatory Visit (HOSPITAL_COMMUNITY): Payer: Self-pay

## 2023-05-06 ENCOUNTER — Encounter: Payer: Self-pay | Admitting: Internal Medicine

## 2023-05-06 VITALS — BP 124/80 | HR 82 | Ht 62.0 in | Wt 208.4 lb

## 2023-05-06 DIAGNOSIS — Z954 Presence of other heart-valve replacement: Secondary | ICD-10-CM | POA: Diagnosis not present

## 2023-05-06 MED ORDER — AMOXICILLIN 500 MG PO TABS: ORAL_TABLET | ORAL | 6 refills | Status: DC

## 2023-05-06 NOTE — Patient Instructions (Signed)
Medication Instructions:   Discussed the use of prophylactic antibiotics before dental work and other surgeries. Prescription given for Amoxicillin 2 grams orally one hour before procedure.   *If you need a refill on your cardiac medications before your next appointment, please call your pharmacy*    Follow-Up:  In August 2025 with Dr. Tenny Craw here in the office

## 2023-05-08 ENCOUNTER — Other Ambulatory Visit: Payer: Self-pay | Admitting: Internal Medicine

## 2023-05-11 ENCOUNTER — Encounter: Payer: Self-pay | Admitting: Gastroenterology

## 2023-05-13 ENCOUNTER — Encounter: Payer: Self-pay | Admitting: Internal Medicine

## 2023-05-25 ENCOUNTER — Encounter (HOSPITAL_BASED_OUTPATIENT_CLINIC_OR_DEPARTMENT_OTHER): Payer: Managed Care, Other (non HMO) | Admitting: Family Medicine

## 2023-05-31 ENCOUNTER — Encounter: Payer: Self-pay | Admitting: Internal Medicine

## 2023-05-31 ENCOUNTER — Ambulatory Visit: Payer: Managed Care, Other (non HMO) | Attending: Internal Medicine

## 2023-05-31 DIAGNOSIS — I471 Supraventricular tachycardia, unspecified: Secondary | ICD-10-CM

## 2023-05-31 DIAGNOSIS — Z954 Presence of other heart-valve replacement: Secondary | ICD-10-CM

## 2023-05-31 DIAGNOSIS — R002 Palpitations: Secondary | ICD-10-CM

## 2023-05-31 NOTE — Telephone Encounter (Signed)
Please set up for a 2 wk Zio patch

## 2023-05-31 NOTE — Progress Notes (Unsigned)
Enrolled patient for a 14 day Zio XT  monitor to be mailed to patients home  °

## 2023-06-02 DIAGNOSIS — Z954 Presence of other heart-valve replacement: Secondary | ICD-10-CM | POA: Diagnosis not present

## 2023-06-02 DIAGNOSIS — I471 Supraventricular tachycardia, unspecified: Secondary | ICD-10-CM

## 2023-06-02 DIAGNOSIS — R002 Palpitations: Secondary | ICD-10-CM

## 2023-06-10 ENCOUNTER — Encounter: Payer: Managed Care, Other (non HMO) | Admitting: Gastroenterology

## 2023-06-29 ENCOUNTER — Other Ambulatory Visit: Payer: Self-pay

## 2023-06-29 MED ORDER — METOPROLOL SUCCINATE ER 25 MG PO TB24: 25.0000 mg | ORAL_TABLET | Freq: Two times a day (BID) | ORAL | 3 refills | Status: DC

## 2023-07-08 ENCOUNTER — Ambulatory Visit (HOSPITAL_BASED_OUTPATIENT_CLINIC_OR_DEPARTMENT_OTHER): Payer: Managed Care, Other (non HMO) | Admitting: Family Medicine

## 2023-07-08 ENCOUNTER — Ambulatory Visit: Payer: Managed Care, Other (non HMO)

## 2023-07-08 VITALS — BP 102/45 | HR 72 | Temp 98.2°F | Ht 62.0 in | Wt 207.0 lb

## 2023-07-08 DIAGNOSIS — H66005 Acute suppurative otitis media without spontaneous rupture of ear drum, recurrent, left ear: Secondary | ICD-10-CM | POA: Insufficient documentation

## 2023-07-08 DIAGNOSIS — Z0000 Encounter for general adult medical examination without abnormal findings: Secondary | ICD-10-CM

## 2023-07-08 DIAGNOSIS — R7303 Prediabetes: Secondary | ICD-10-CM | POA: Insufficient documentation

## 2023-07-08 LAB — POCT GLYCOSYLATED HEMOGLOBIN (HGB A1C): HbA1c POC (<> result, manual entry): 5.7 % (ref 4.0–5.6)

## 2023-07-08 MED ORDER — AMOXICILLIN-POT CLAVULANATE 875-125 MG PO TABS: 1.0000 | ORAL_TABLET | Freq: Two times a day (BID) | ORAL | 0 refills | Status: DC

## 2023-07-08 MED ORDER — FLUCONAZOLE 150 MG PO TABS: 150.0000 mg | ORAL_TABLET | Freq: Once | ORAL | 0 refills | Status: AC

## 2023-07-08 NOTE — Progress Notes (Signed)
Subjective:   Tonya Gardner 01-May-1971  07/08/2023   CC: Chief Complaint  Patient presents with   Annual Exam    HPI: Tonya Gardner is a 52 y.o. female who presents for a routine health maintenance exam.  Labs collected at time of visit.   IMPAIRED FASTING GLUCOSE Tonya Gardner is here for medical management of impaired fasting glucose.  Patient's current IFG medication regimen is: Diet, exercise Adhering to a diabetic diet: Intermittently Exercising Regularly: Yes, restarted post valve replacement surgery Checking Blood Sugars: No  Denies polydipsia, polyphagia, polyuria.   Lab Results  Component Value Date   HGBA1C 5.7 07/08/2023      HEALTH SCREENINGS: - Vision Screening: up to date - Dental Visits: up to date - Pap smear:  N/a s/p TAH - Breast Exam:  Declined - STD Screening: Not applicable - Mammogram (40+):  Ordered, patient to schedule   - Colonoscopy (45+):  Ordreed, pat to schedule   - Bone Density (65+ or under 65 with predisposing conditions): Not applicable  - Lung CA screening with low-dose CT:  Not applicable Adults age 61-80 who are current cigarette smokers or quit within the last 15 years. Must have 20 pack year history.   Depression and Anxiety Screen done today and results listed below:     07/08/2023    8:15 AM 04/27/2023    9:00 AM  Depression screen PHQ 2/9  Decreased Interest 0 0  Down, Depressed, Hopeless 0 0  PHQ - 2 Score 0 0  Altered sleeping  1  Tired, decreased energy  1  Change in appetite  0  Feeling bad or failure about yourself   0  Trouble concentrating  0  Moving slowly or fidgety/restless  0  Suicidal thoughts  0  PHQ-9 Score  2  Difficult doing work/chores  Not difficult at all      04/27/2023    9:00 AM  GAD 7 : Generalized Anxiety Score  Nervous, Anxious, on Edge 1  Control/stop worrying 1  Worry too much - different things 1  Trouble relaxing 1  Restless 1  Easily annoyed or irritable 0  Afraid - awful  might happen 0  Total GAD 7 Score 5  Anxiety Difficulty Not difficult at all    IMMUNIZATIONS: - Tdap: Tetanus vaccination status reviewed: last tetanus booster within 10 years. - HPV: Not applicable - Influenza: Refused - Pneumovax: Not applicable - Prevnar 20: Not applicable - Zostavax (50+): Refused   Past medical history, surgical history, medications, allergies, family history and social history reviewed with patient today and changes made to appropriate areas of the chart.   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    Past Surgical History:  Procedure Laterality Date   ABDOMINAL HYSTERECTOMY N/A 08/12/2014   Procedure: HYSTERECTOMY ABDOMINAL;  Surgeon: Jeani Hawking, MD;  Location: [facility];  Service: Gynecology;  Laterality: N/A;   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   Pulmonary   CESAREAN SECTION     LAPAROSCOPIC ASSISTED VAGINAL HYSTERECTOMY N/A  08/12/2014   Procedure: ATTEMPTED LAPAROSCOPIC ASSISTED VAGINAL HYSTERECTOMY;  Surgeon: Jeani Hawking, MD;  Location: [facility];  Service: Gynecology;  Laterality: N/A;   PACEMAKER INSERTION     [employer]    RIGHT/LEFT HEART CATH AND CORONARY ANGIOGRAPHY N/A 07/01/2020   Procedure: RIGHT/LEFT HEART CATH AND CORONARY ANGIOGRAPHY;  Surgeon: Corky Crafts, MD;  Location: [facility] INVASIVE CV LAB;  Service: Cardiovascular;  Laterality: N/A;   SALPINGOOPHORECTOMY Bilateral 08/12/2014   Procedure: SALPINGO OOPHORECTOMY;  Surgeon: Jeani Hawking, MD;  Location: [facility];  Service: Gynecology;  Laterality: Bilateral;   SVT ABLATION N/A 08/21/2020   Procedure: SVT ABLATION;   Surgeon: Marinus Maw, MD;  Location: [facility] INVASIVE CV LAB;  Service: Cardiovascular;  Laterality: N/A;   TETRALOGY OF FALLOT REPAIR     TONSILLECTOMY     TUBAL LIGATION  2002    Current Outpatient Medications on File Prior to Visit  Medication Sig   amoxicillin (AMOXIL) 500 MG tablet Take 4 tablets (2,000 mg total) by mouth 1 hour prior to any dental work/cleaning.   aspirin EC 81 MG tablet Take 81 mg by mouth daily.   ibuprofen (ADVIL) 200 MG tablet Take 400 mg by mouth every 8 (eight) hours as needed for mild pain or moderate pain (pain.).   levocetirizine (XYZAL) 5 MG tablet Take 5 mg by mouth daily as needed for allergies.   metoprolol succinate (TOPROL-XL) 25 MG 24 hr tablet Take 1 tablet (25 mg total) by mouth in the morning and at bedtime.   pantoprazole (PROTONIX) 40 MG tablet TAKE 1 TABLET(40 MG) BY MOUTH DAILY   rosuvastatin (CRESTOR) 10 MG tablet Take 1 tablet (10 mg total) by mouth daily.   sacubitril-valsartan (ENTRESTO) 24-26 MG TAKE 1 TABLET BY MOUTH TWICE DAILY   No current facility-administered medications on file prior to visit.    Allergies  Allergen Reactions   Sulfonamide Derivatives Hives   Latex Rash     Social History   Socioeconomic History   Marital status: Divorced    Spouse name: Not on file   Number of children: Not on file   Years of education: Not on file   Highest education level: Associate degree: occupational, Scientist, product/process development, or vocational program  Occupational History   Not on file  Tobacco Use   Smoking status: Former    Types: Cigarettes   Smokeless tobacco: Never  Vaping Use   Vaping status: Never Used  Substance and Sexual Activity   Alcohol use: Yes    Alcohol/week: 1.0 standard drink of alcohol    Types: 1 Standard drinks or equivalent per week    Comment: occasionally   Drug use: No   Sexual activity: Not Currently    Birth control/protection: Abstinence, Other-see comments, None    Comment: Full hysterectomy  Other Topics  Concern   Not on file  Social History Narrative   Not on file   Social Determinants of Health   Financial Resource Strain: Low Risk  (07/08/2023)   Overall Financial Resource Strain (CARDIA)    Difficulty of Paying Living Expenses: Not very hard  Food Insecurity: No Food Insecurity (07/08/2023)   Hunger Vital Sign    Worried About Running Out of Food in the Last Year: Never true    Ran Out of Food in the Last Year: Never true  Transportation Needs: No Transportation Needs (07/08/2023)   PRAPARE - Administrator, Civil Service (Medical): No    Lack of Transportation (Non-Medical): No  Physical Activity: Insufficiently Active (07/08/2023)   Exercise Vital Sign    Days of Exercise per Week: 2 days    Minutes of Exercise per Session: 60 min  Stress: Stress Concern Present (07/08/2023)   [employer] of Occupational Health - Occupational Stress Questionnaire    Feeling of Stress : To some extent  Social Connections: Socially Isolated (07/08/2023)   Social Connection and Isolation Panel [NHANES]    Frequency of Communication with Friends and Family: More than three times a week    Frequency of Social Gatherings with Friends and Family: Once a week    Attends Religious Services: Never    Database administrator or Organizations: No    Attends Engineer, structural: Not on file    Marital Status: Divorced  Catering manager Violence: Not on file   Social History   Tobacco Use  Smoking Status Former   Types: Cigarettes  Smokeless Tobacco Never   Social History   Substance and Sexual Activity  Alcohol Use Yes   Alcohol/week: 1.0 standard drink of alcohol   Types: 1 Standard drinks or equivalent per week   Comment: occasionally    Family History  Problem Relation Age of Onset   Drug abuse Mother    Heart disease Mother    Alcohol abuse Father    Heart disease Father    Arthritis Maternal Grandmother    Anxiety disorder Daughter      ROS: Denies  fever, fatigue, unexplained weight loss/gain, chest pain, SHOB, and palpitations. Denies neurological deficits, gastrointestinal or genitourinary complaints, and skin changes.   Objective:   Today's Vitals   07/08/23 0815  BP: (!) 102/45  Pulse: 72  Temp: 98.2 F (36.8 C)  TempSrc: Oral  SpO2: 100%  Weight: 207 lb (93.9 kg)  Height: 5\' 2"  (1.575 m)  PainSc: 0-No pain    GENERAL APPEARANCE: Well-appearing, in NAD. Well nourished.  SKIN: Pink, warm and dry. Turgor normal. No rash, lesion, ulceration, or ecchymoses. Hair evenly distributed.  HEENT: HEAD: Normocephalic.  EYES: PERRLA. EOMI. Lids intact w/o defect. Sclera white, Conjunctiva pink w/o exudate.  EARS: External ear w/o redness, swelling, masses or lesions. EAC clear. Right TM intact, translucent w/o bulging, appropriate landmarks visualized. Left TM intact, cloudy and mildly suppurative without drainage. No injection. Appropriate acuity to conversational tones.  NOSE: Septum midline w/o deformity. Nares patent, mucosa pink and non-inflamed w/o drainage. No sinus tenderness.  THROAT: Uvula midline. Oropharynx clear.  Oral mucosa pink and moist.  NECK: Supple, Trachea midline. Full ROM w/o pain or tenderness. No lymphadenopathy. Thyroid non-tender w/o enlargement or palpable masses.  RESPIRATORY: Chest wall symmetrical w/o masses. Respirations even and non-labored. Breath sounds clear to auscultation bilaterally. No wheezes, rales, rhonchi, or crackles. CARDIAC: S1, S2 present, regular rate and rhythm. No gallops, murmurs, rubs, or clicks. PMI w/o lifts, heaves, or thrills. No carotid bruits. Capillary refill <2 seconds. Peripheral pulses 2+ bilaterally. GI: Abdomen soft w/o distention. Normoactive bowel sounds. No palpable masses or tenderness. No guarding or rebound tenderness. Liver and spleen w/o tenderness or enlargement. No CVA tenderness.  MSK: Muscle tone and strength appropriate for age, w/o atrophy or abnormal movement.   EXTREMITIES: Active ROM intact, w/o tenderness, crepitus, or contracture. No obvious joint deformities or effusions. No clubbing, edema, or cyanosis.  NEUROLOGIC: CN's II-XII intact. Motor strength symmetrical with no obvious weakness. No sensory deficits. DTR's 2+ symmetric bilaterally. Steady, even gait.  PSYCH/MENTAL STATUS: Alert, oriented x 3. Cooperative, appropriate mood and  affect.     Lab Results  Component Value Date   WBC 5.1 03/03/2023   HGB 12.8 03/03/2023   HCT 40.0 03/03/2023   MCV 88.1 03/03/2023   PLT 210 03/03/2023      Latest Ref Rng & Units 03/03/2023    2:35 AM 03/02/2023    5:00 PM 03/02/2023   11:35 AM  CMP  Glucose 70 - 99 mg/dL 72   73   BUN 6 - 20 mg/dL 15   16   Creatinine 7.42 - 1.00 mg/dL 5.95  6.38  7.56   Sodium 135 - 145 mmol/L 136   141   Potassium 3.5 - 5.1 mmol/L 3.6   4.0   Chloride 98 - 111 mmol/L 105   109   CO2 22 - 32 mmol/L 21   18   Calcium 8.9 - 10.3 mg/dL 8.7   9.1       Assessment & Plan:  1. Annual physical exam Doing well overall. She has been referred for her Cscope and mammogram and will call to schedule. Patient's cardiology does all blood work for routinely and will continue. Discussed mood, lab results, and preventative screenings, vaccines in depth. Declined flu and Shingrix vaccines today and verbalized understanding of risk.   2. Prediabetes A1C 5.7. Last A1C in 2023 was 5.8. Pt reports recent stressors and surgery in the past year has contributed to impaired exercise and dietary control. Pt would like to try diet, exercise and lifestyle changes first and recheck in 1 year. PCP agreeable to this plan of care. Prediabetes eating plan and exercise reviewed.  - POCT HgB A1C  3. Recurrent acute suppurative otitis media without spontaneous rupture of left tympanic membrane Recurrent ear infections for several years. Start Augmentin BID x 7 days and use Xyzal daily. Sinus rinses recommended as well as a preventative. Pt  requested referral to ENT. Diflucan sent in to use PRN pending antibiotic related yeast infection that pt is prone to.   - Ambulatory referral to ENT   Orders Placed This Encounter  Procedures   Ambulatory referral to ENT    Referral Priority:   Routine    Referral Type:   Consultation    Referral Reason:   Specialty Services Required    Referred to Provider:   Suzanna Obey, MD    Requested Specialty:   Otolaryngology    Number of Visits Requested:   1   POCT HgB A1C    PATIENT COUNSELING:  - Encouraged a healthy well-balanced diet. Patient may adjust caloric intake to maintain or achieve ideal body weight. May reduce intake of dietary saturated fat and total fat and have adequate dietary potassium and calcium preferably from fresh fruits, vegetables, and low-fat dairy products.   - Advised to avoid cigarette smoking. - Discussed with the patient that most people either abstain from alcohol or drink within safe limits (<=14/week and <=4 drinks/occasion for males, <=7/weeks and <= 3 drinks/occasion for females) and that the risk for alcohol disorders and other health effects rises proportionally with the number of drinks per week and how often a drinker exceeds daily limits. - Discussed cessation/primary prevention of drug use and availability of treatment for abuse.  - Discussed sexually transmitted diseases, avoidance of unintended pregnancy and contraceptive alternatives.  - Stressed the importance of regular exercise - Injury prevention: Discussed safety belts, safety helmets, smoke detector, smoking near bedding or upholstery.  - Dental health: Discussed importance of regular tooth brushing, flossing, and dental visits.  NEXT PREVENTATIVE PHYSICAL DUE IN 1 YEAR.  Return in about 1 year (around 07/07/2024) for ANNUAL PHYSICAL, Prediabetes check .  Patient to reach out to office if new, worrisome, or unresolved symptoms arise or if no improvement in patient's condition. Patient  verbalized understanding and is agreeable to treatment plan. All questions answered to patient's satisfaction.    Yolanda Manges, FNP

## 2023-07-08 NOTE — Patient Instructions (Addendum)
[facility] (for colonoscopy)  Address: [address], [place], [place] [address] Phone: [phone]  [facility]    Address: [address], [place], [place] [address] Areas served: [place] and nearby areas Phone: [phone]

## 2023-07-14 ENCOUNTER — Encounter (INDEPENDENT_AMBULATORY_CARE_PROVIDER_SITE_OTHER): Payer: Self-pay | Admitting: Otolaryngology

## 2023-08-02 ENCOUNTER — Encounter (INDEPENDENT_AMBULATORY_CARE_PROVIDER_SITE_OTHER): Payer: Self-pay | Admitting: Otolaryngology

## 2023-08-24 ENCOUNTER — Ambulatory Visit
Admission: EM | Admit: 2023-08-24 | Discharge: 2023-08-24 | Disposition: A | Discharge status: Home / Self Care | Payer: Managed Care, Other (non HMO) | Attending: Family Medicine | Admitting: Family Medicine

## 2023-08-24 ENCOUNTER — Encounter: Payer: Self-pay | Admitting: Emergency Medicine

## 2023-08-24 DIAGNOSIS — J069 Acute upper respiratory infection, unspecified: Secondary | ICD-10-CM | POA: Insufficient documentation

## 2023-08-24 DIAGNOSIS — H6993 Unspecified Eustachian tube disorder, bilateral: Secondary | ICD-10-CM | POA: Insufficient documentation

## 2023-08-24 LAB — POCT INFLUENZA A/B
Influenza A, POC: NEGATIVE
Influenza B, POC: NEGATIVE

## 2023-08-24 MED ORDER — PROMETHAZINE-DM 6.25-15 MG/5ML PO SYRP: 5.0000 mL | ORAL_SOLUTION | Freq: Four times a day (QID) | ORAL | 0 refills | Status: AC | PRN

## 2023-08-24 MED ORDER — PREDNISONE 20 MG PO TABS
40.0000 mg | ORAL_TABLET | Freq: Every day | ORAL | 0 refills | Status: AC
Start: 2023-08-24 — End: ?

## 2023-08-24 MED ORDER — FLUTICASONE PROPIONATE 50 MCG/ACT NA SUSP: 1.0000 | Freq: Two times a day (BID) | NASAL | 2 refills | Status: AC

## 2023-08-24 NOTE — ED Triage Notes (Signed)
 Body aches, congestion, fatigue x 2 days with cough, ear pain, watery eyes.  Has been taking dayquil.

## 2023-08-24 NOTE — ED Provider Notes (Signed)
 [facility]    CSN: [id number] Arrival date & time: 08/24/23  1119      History   Chief Complaint No chief complaint on file.   HPI Tonya Gardner is a 53 y.o. female.   Patient presenting today with 2-day history of bodyaches, congestion, fatigue, watery eyes, ear pain and pressure.  Denies chest pain, shortness of breath, abdominal pain, nausea vomiting or diarrhea.  No known history of chronic pulmonary disease.  Trying over-the-counter remedies such as DayQuil NyQuil with minimal relief.    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   Chest pain 03/02/2023   Abnormal echocardiogram    Shortness of breath    Paroxysmal A-fib (HCC) 06/02/2020   Supraventricular tachycardia (HCC) 06/02/2020   History of CHF (congestive heart failure) 06/02/2020   Hyperglycemia 06/02/2020   Obesity (BMI 30-39.9) 06/02/2020   History of tetralogy of Fallot 06/02/2020   Menorrhagia 08/12/2014   Essential hypertension, benign 10/16/2009   Cardiac pacemaker in situ 10/16/2009   SINUS BRADYCARDIA 10/13/2009   CHF 10/13/2009    Past Surgical History:  Procedure Laterality Date   ABDOMINAL HYSTERECTOMY N/A 08/12/2014   Procedure: HYSTERECTOMY ABDOMINAL;  Surgeon: Rosaline LITTIE Cobble, MD;  Location: [facility];  Service: Gynecology;  Laterality: N/A;   CARDIAC VALVE REPLACEMENT   04/12/2023   Pulmonary   CESAREAN SECTION     LAPAROSCOPIC ASSISTED VAGINAL HYSTERECTOMY N/A 08/12/2014   Procedure: ATTEMPTED LAPAROSCOPIC ASSISTED VAGINAL HYSTERECTOMY;  Surgeon: Rosaline LITTIE Cobble, MD;  Location: [facility];  Service: Gynecology;  Laterality: N/A;   PACEMAKER INSERTION     [employer]    RIGHT/LEFT HEART CATH AND CORONARY ANGIOGRAPHY N/A 07/01/2020   Procedure: RIGHT/LEFT HEART CATH AND CORONARY ANGIOGRAPHY;  Surgeon: Dann Candyce GORMAN, MD;  Location: [facility] INVASIVE CV LAB;  Service: Cardiovascular;  Laterality: N/A;   SALPINGOOPHORECTOMY Bilateral 08/12/2014   Procedure: SALPINGO OOPHORECTOMY;  Surgeon: Rosaline LITTIE Cobble, MD;  Location: [facility];  Service: Gynecology;  Laterality: Bilateral;   SVT ABLATION N/A 08/21/2020   Procedure: SVT ABLATION;  Surgeon: Waddell Danelle ORN, MD;  Location: [facility] INVASIVE CV LAB;  Service: Cardiovascular;  Laterality: N/A;   TETRALOGY OF FALLOT REPAIR     TONSILLECTOMY     TUBAL LIGATION  2002    OB History     Gravida  3   Para      Term      Preterm      AB      Living         SAB      IAB      Ectopic      Multiple      Live Births  2            Home Medications    Prior to Admission  medications   Medication Sig Start Date End Date Taking? Authorizing Provider  fluticasone  (FLONASE ) 50 MCG/ACT nasal spray Place 1 spray into both nostrils 2 (two) times daily. 08/24/23  Yes Stuart Vernell Norris, PA-C  predniSONE  (DELTASONE ) 20 MG tablet Take 2 tablets (40 mg total) by mouth daily with breakfast. 08/24/23  Yes Stuart Vernell Norris, PA-C  promethazine -dextromethorphan (PROMETHAZINE -DM) 6.25-15 MG/5ML syrup Take 5 mLs by mouth 4 (four) times daily as needed. 08/24/23  Yes Stuart Vernell Norris, PA-C  aspirin  EC 81 MG tablet Take 81 mg by mouth daily. 04/14/23 04/13/24  [provider]  ibuprofen  (ADVIL ) 200 MG tablet Take 400 mg by mouth every 8 (eight) hours as needed for mild pain or moderate pain (pain.).     [provider]  levocetirizine (XYZAL) 5 MG tablet Take 5 mg by mouth daily as needed for allergies. 01/26/18   [provider]  metoprolol  succinate (TOPROL -XL) 25 MG 24 hr tablet Take 1 tablet (25 mg total) by mouth in the morning and at bedtime. 06/29/23   Okey Vina GAILS, MD  pantoprazole  (PROTONIX ) 40 MG tablet TAKE 1 TABLET(40 MG) BY MOUTH DAILY 03/22/23   Okey Vina GAILS, MD  rosuvastatin  (CRESTOR ) 10 MG tablet Take 1 tablet (10 mg total) by mouth daily. 04/14/23   Okey Vina GAILS, MD  sacubitril -valsartan  (ENTRESTO ) 24-26 MG TAKE 1 TABLET BY MOUTH TWICE DAILY 05/09/23   Okey Vina GAILS, MD    Family History Family History  Problem Relation Age of Onset   Drug abuse Mother    Heart disease Mother    Alcohol  abuse Father    Heart disease Father    Arthritis Maternal Grandmother    Anxiety disorder Daughter     Social History Social History   Tobacco Use   Smoking status: Former    Types: Cigarettes   Smokeless tobacco: Never  Vaping Use   Vaping status: Never Used  Substance Use Topics   Alcohol  use: Yes    Alcohol /week: 1.0 standard drink of alcohol     Types: 1 Standard drinks or equivalent per week    Comment: occasionally   Drug use: No     Allergies   Sulfonamide derivatives and Latex   Review of Systems Review of Systems Per HPI  Physical Exam Triage Vital Signs ED Triage Vitals  Encounter Vitals Group     BP 08/24/23 1139 101/71     Systolic BP Percentile --      Diastolic BP Percentile --      Pulse Rate 08/24/23 1139 78     Resp 08/24/23 1139 18     Temp 08/24/23 1139 98 F (36.7 C)     Temp Source 08/24/23 1139 Oral     SpO2 08/24/23 1139 95 %     Weight --      Height --      Head Circumference --      Peak Flow --      Pain Score 08/24/23 1140 1     Pain Loc --      Pain Education --      Exclude from Growth Chart --    No data found.  Updated Vital Signs BP 101/71 (BP Location: Right Arm)   Pulse 78   Temp 98 F (36.7 C)  (Oral)   Resp 18   LMP 11/20/2013   SpO2 95%   Visual Acuity Right Eye Distance:   Left Eye Distance:   Bilateral Distance:    Right  Eye Near:   Left Eye Near:    Bilateral Near:     Physical Exam Vitals and nursing note reviewed.  Constitutional:      Appearance: Normal appearance.  HENT:     Head: Atraumatic.     Right Ear: External ear normal.     Left Ear: External ear normal.     Ears:     Comments: Bilateral middle ear effusions    Nose: Rhinorrhea present.     Mouth/Throat:     Mouth: Mucous membranes are moist.     Pharynx: Posterior oropharyngeal erythema present.  Eyes:     Extraocular Movements: Extraocular movements intact.     Conjunctiva/sclera: Conjunctivae normal.  Cardiovascular:     Rate and Rhythm: Normal rate and regular rhythm.     Heart sounds: Normal heart sounds.  Pulmonary:     Effort: Pulmonary effort is normal.     Breath sounds: Normal breath sounds. No wheezing.  Musculoskeletal:        General: Normal range of motion.     Cervical back: Normal range of motion and neck supple.  Skin:    General: Skin is warm and dry.  Neurological:     Mental Status: She is alert and oriented to person, place, and time.  Psychiatric:        Mood and Affect: Mood normal.        Thought Content: Thought content normal.      UC Treatments / Results  Labs (all labs ordered are listed, but only abnormal results are displayed) Labs Reviewed  SARS CORONAVIRUS 2 (TAT 6-24 HRS)  POCT INFLUENZA A/B    EKG   Radiology No results found.  Procedures Procedures (including critical care time)  Medications Ordered in UC Medications - No data to display  Initial Impression / Assessment and Plan / UC Course  I have reviewed the triage vital signs and the nursing notes.  Pertinent labs & imaging results that were available during my care of the patient were reviewed by me and considered in my medical decision making (see chart for details).      Vital signs and exam overall reassuring today and suggestive of a viral respiratory infection causing bilateral middle ear effusions.  Will treat with prednisone , Flonase , Phenergan  DM, supportive over-the-counter medications and home care.  Flu test was negative in clinic, COVID test pending. Note given. Final Clinical Impressions(s) / UC Diagnoses   Final diagnoses:  Viral URI with cough  Acute dysfunction of Eustachian tube, bilateral   Discharge Instructions   None    ED Prescriptions     Medication Sig Dispense Auth. Provider   predniSONE  (DELTASONE ) 20 MG tablet Take 2 tablets (40 mg total) by mouth daily with breakfast. 10 tablet Stuart Vernell Norris, PA-C   fluticasone  (FLONASE ) 50 MCG/ACT nasal spray Place 1 spray into both nostrils 2 (two) times daily. 16 g Stuart Vernell Norris, PA-C   promethazine -dextromethorphan (PROMETHAZINE -DM) 6.25-15 MG/5ML syrup Take 5 mLs by mouth 4 (four) times daily as needed. 100 mL Stuart Vernell Norris, [place]      PDMP not reviewed this encounter.   Stuart Vernell Norris, [place] 08/24/23 1435

## 2023-08-25 LAB — SARS CORONAVIRUS 2 (TAT 6-24 HRS): SARS Coronavirus 2: NEGATIVE

## 2023-12-13 ENCOUNTER — Encounter: Payer: Self-pay | Admitting: Internal Medicine

## 2023-12-13 ENCOUNTER — Other Ambulatory Visit: Payer: Self-pay

## 2023-12-13 DIAGNOSIS — I119 Hypertensive heart disease without heart failure: Secondary | ICD-10-CM | POA: Diagnosis not present

## 2023-12-13 DIAGNOSIS — Z87891 Personal history of nicotine dependence: Secondary | ICD-10-CM | POA: Diagnosis not present

## 2023-12-13 DIAGNOSIS — R002 Palpitations: Secondary | ICD-10-CM

## 2023-12-13 DIAGNOSIS — Q213 Tetralogy of Fallot: Secondary | ICD-10-CM | POA: Diagnosis not present

## 2023-12-13 DIAGNOSIS — Z952 Presence of prosthetic heart valve: Secondary | ICD-10-CM | POA: Diagnosis not present

## 2023-12-13 DIAGNOSIS — Z8774 Personal history of (corrected) congenital malformations of heart and circulatory system: Secondary | ICD-10-CM | POA: Diagnosis not present

## 2023-12-13 DIAGNOSIS — Z9889 Other specified postprocedural states: Secondary | ICD-10-CM | POA: Diagnosis not present

## 2023-12-13 DIAGNOSIS — I517 Cardiomegaly: Secondary | ICD-10-CM | POA: Diagnosis not present

## 2023-12-13 DIAGNOSIS — I071 Rheumatic tricuspid insufficiency: Secondary | ICD-10-CM | POA: Diagnosis not present

## 2023-12-13 DIAGNOSIS — Z950 Presence of cardiac pacemaker: Secondary | ICD-10-CM | POA: Diagnosis not present

## 2023-12-13 DIAGNOSIS — R001 Bradycardia, unspecified: Secondary | ICD-10-CM | POA: Diagnosis not present

## 2023-12-13 DIAGNOSIS — I4729 Other ventricular tachycardia: Secondary | ICD-10-CM | POA: Diagnosis not present

## 2023-12-13 DIAGNOSIS — J984 Other disorders of lung: Secondary | ICD-10-CM | POA: Diagnosis not present

## 2023-12-13 DIAGNOSIS — I4510 Unspecified right bundle-branch block: Secondary | ICD-10-CM | POA: Diagnosis not present

## 2023-12-13 DIAGNOSIS — R9431 Abnormal electrocardiogram [ECG] [EKG]: Secondary | ICD-10-CM | POA: Diagnosis not present

## 2023-12-13 MED ORDER — METOPROLOL SUCCINATE ER 25 MG PO TB24: 25.0000 mg | ORAL_TABLET | Freq: Every day | ORAL | Status: DC

## 2023-12-13 NOTE — Telephone Encounter (Signed)
 Set up for a 2 wk monitor

## 2023-12-15 ENCOUNTER — Ambulatory Visit: Payer: Self-pay | Attending: Internal Medicine

## 2023-12-15 DIAGNOSIS — R002 Palpitations: Secondary | ICD-10-CM

## 2023-12-15 NOTE — Progress Notes (Unsigned)
 Enrolled patient for a 14 day Zio XT  monitor to be mailed to patients home

## 2023-12-28 ENCOUNTER — Ambulatory Visit (INDEPENDENT_AMBULATORY_CARE_PROVIDER_SITE_OTHER): Admitting: Family Medicine

## 2023-12-28 ENCOUNTER — Encounter (HOSPITAL_BASED_OUTPATIENT_CLINIC_OR_DEPARTMENT_OTHER): Payer: Self-pay | Admitting: Family Medicine

## 2023-12-28 VITALS — BP 114/72 | HR 61 | Temp 98.4°F | Ht 62.0 in | Wt 212.6 lb

## 2023-12-28 DIAGNOSIS — J0140 Acute pansinusitis, unspecified: Secondary | ICD-10-CM

## 2023-12-28 MED ORDER — AMOXICILLIN-POT CLAVULANATE 875-125 MG PO TABS: 1.0000 | ORAL_TABLET | Freq: Two times a day (BID) | ORAL | 0 refills | Status: AC

## 2023-12-28 NOTE — Progress Notes (Signed)
 Acute Care Office Visit  Subjective:   Tonya Gardner 11/20/1970 12/28/2023  Chief Complaint  Patient presents with   Headache    Patient has had a headache and pressure for 1 week now, states that her vision has also become blurry due to the headache. Also has had complaints of fatigue, sinus pressure, and ear aches.    HPI: URI SYMPTOMS: Patient states she has severe sinus pressure, nausea and headache ongoing for over 1 week. She has started to notice vision changes to peripheral vision due sinus pressure and headaches. She uses Xyzal nightly. Reports itching and ear pain bilaterally.   Fever: Chills reported Runny nose: Yes Nasal congestion: Yes  Sinus pressure: Yes Post nasal drip: Yes Cough: Yes, dry cough Ear pain: Yes Sore throat: No  Treatments tried: None  Recent sick contacts: No   The following portions of the patient's history were reviewed and updated as appropriate: past medical history, past surgical history, family history, social history, allergies, medications, and problem list.   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   Chest pain 03/02/2023   Abnormal echocardiogram    Shortness of breath    Paroxysmal A-fib (HCC) 06/02/2020   Supraventricular tachycardia (HCC) 06/02/2020   History of CHF (congestive heart failure) 06/02/2020   Hyperglycemia 06/02/2020   Obesity (BMI 30-39.9) 06/02/2020   History of tetralogy of Fallot 06/02/2020   Menorrhagia 08/12/2014   Essential hypertension, benign 10/16/2009   Cardiac pacemaker in situ 10/16/2009   SINUS BRADYCARDIA 10/13/2009   CHF 10/13/2009   Past Medical History:  Diagnosis Date   Allergy    Sulpha drugs, latex   Anxiety    CHF (congestive heart failure) (HCC) 08/23/2000   with  pregnancy   GERD (gastroesophageal reflux disease)    Headache    Heart murmur    Presence of permanent cardiac pacemaker    off for 2 years   Seizures (HCC) 08/23/2000   due to eclampsia   Sinus bradycardia    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   Past Surgical History:  Procedure Laterality Date   ABDOMINAL HYSTERECTOMY N/A 08/12/2014   Procedure: HYSTERECTOMY ABDOMINAL;  Surgeon: Martine Sleek, MD;  Location: [facility];  Service: Gynecology;  Laterality: N/A;   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   Pulmonary   CESAREAN SECTION     LAPAROSCOPIC ASSISTED VAGINAL HYSTERECTOMY N/A 08/12/2014   Procedure: ATTEMPTED LAPAROSCOPIC ASSISTED VAGINAL HYSTERECTOMY;  Surgeon: Martine Sleek, MD;  Location: [facility];  Service: Gynecology;  Laterality: N/A;   PACEMAKER INSERTION     [employer]    RIGHT/LEFT HEART CATH AND CORONARY ANGIOGRAPHY N/A 07/01/2020   Procedure: RIGHT/LEFT HEART CATH AND CORONARY ANGIOGRAPHY;  Surgeon: Lucendia Rusk, MD;  Location: [facility] INVASIVE CV LAB;  Service: Cardiovascular;  Laterality: N/A;   SALPINGOOPHORECTOMY Bilateral 08/12/2014   Procedure: SALPINGO OOPHORECTOMY;  Surgeon: Martine Sleek, MD;  Location: [facility];  Service: Gynecology;  Laterality: Bilateral;   SVT ABLATION N/A 08/21/2020   Procedure: SVT ABLATION;  Surgeon: Tammie Fall, MD;  Location: [facility] INVASIVE CV LAB;  Service: Cardiovascular;  Laterality: N/A;   TETRALOGY OF FALLOT REPAIR     TONSILLECTOMY     TUBAL LIGATION  2002   Family History  Problem Relation Age of Onset  Drug abuse Mother    Heart disease Mother    Alcohol  abuse Father    Heart disease Father    Arthritis Maternal Grandmother    Anxiety disorder Daughter    Outpatient Medications Prior to Visit  Medication Sig Dispense Refill   aspirin  EC 81 MG tablet Take 81 mg by mouth daily.     fluticasone  (FLONASE ) 50 MCG/ACT nasal spray Place 1 spray into both nostrils 2 (two) times daily. 16 g 2   ibuprofen   (ADVIL ) 200 MG tablet Take 400 mg by mouth every 8 (eight) hours as needed for mild pain or moderate pain (pain.).     levocetirizine (XYZAL) 5 MG tablet Take 5 mg by mouth daily as needed for allergies.     metoprolol  succinate (TOPROL -XL) 25 MG 24 hr tablet Take 1 tablet (25 mg total) by mouth at bedtime.     pantoprazole  (PROTONIX ) 40 MG tablet TAKE 1 TABLET(40 MG) BY MOUTH DAILY 90 tablet 3   predniSONE  (DELTASONE ) 20 MG tablet Take 2 tablets (40 mg total) by mouth daily with breakfast. 10 tablet 0   promethazine -dextromethorphan (PROMETHAZINE -DM) 6.25-15 MG/5ML syrup Take 5 mLs by mouth 4 (four) times daily as needed. 100 mL 0   rosuvastatin  (CRESTOR ) 10 MG tablet Take 1 tablet (10 mg total) by mouth daily. 90 tablet 2   sacubitril -valsartan  (ENTRESTO ) 24-26 MG TAKE 1 TABLET BY MOUTH TWICE DAILY 180 tablet 3   No facility-administered medications prior to visit.   Allergies  Allergen Reactions   Sulfonamide Derivatives Hives   Latex Rash     ROS: A complete ROS was performed with pertinent positives/negatives noted in the HPI. The remainder of the ROS are negative.    Objective:   Today's Vitals   12/28/23 1019  BP: 114/72  Pulse: 61  Temp: 98.4 F (36.9 C)  TempSrc: Oral  SpO2: 98%  Weight: 212 lb 9.6 oz (96.4 kg)  Height: 5\' 2"  (1.575 m)    GENERAL: Well-appearing, in NAD. Well nourished.  SKIN: Pink, warm and dry.  Head: Normocephalic. NECK: Trachea midline. Full ROM w/o pain or tenderness. Mild anterior cervical lymphadenopathy.  EARS: Tympanic membranes are intact, left TM is cloudy and slightly injected without bulging or drainage. Right TM is transluscent without bulging and without drainage. Appropriate landmarks visualized.  EYES: Conjunctiva clear without exudates. EOMI, PERRL, no drainage present.  NOSE: Septum midline w/o deformity. Nares patent, mucosa pink and non-inflamed w/o drainage. Moderate  sinus tenderness.  THROAT: Uvula midline. Oropharynx clear.   Mucous membranes pink and moist.  RESPIRATORY: Chest wall symmetrical. Respirations even and non-labored. Breath sounds clear to auscultation bilaterally.  CARDIAC: S1, S2 present, regular rate and rhythm with Grade II systolic murmur. No gallops. Peripheral pulses 2+ bilaterally.  MSK: Muscle tone and strength appropriate for age. NEUROLOGIC: No motor or sensory deficits. Steady, even gait. C2-C12 intact.  PSYCH/MENTAL STATUS: Alert, oriented x 3. Cooperative, appropriate mood and affect.    No results found for any visits on 12/28/23.    Assessment & Plan:  1. Acute non-recurrent pansinusitis (Primary) Start Augmentin  1 tablet twice daily x 7 days for pansinusitis.  Recommend saline nasal spray and continuing patient Xyzal.  Unable to tolerate glucocorticoids such as Flonase  and sinus rinses due to cardiac history.  Work note provided.  Patient to return if no improvement or worsening within 1 week.  - amoxicillin -clavulanate (AUGMENTIN ) 875-125 MG tablet; Take 1 tablet by mouth 2 (two) times daily for 7 days.  Dispense:  14 tablet; Refill: 0   Meds ordered this encounter  Medications   amoxicillin -clavulanate (AUGMENTIN ) 875-125 MG tablet    Sig: Take 1 tablet by mouth 2 (two) times daily for 7 days.    Dispense:  14 tablet    Refill:  0    Supervising Provider:   DE Peru, RAYMOND J [[id number]]   Lab Orders  No laboratory test(s) ordered today    Return if symptoms worsen or fail to improve.    Patient to reach out to office if new, worrisome, or unresolved symptoms arise or if no improvement in patient's condition. Patient verbalized understanding and is agreeable to treatment plan. All questions answered to patient's satisfaction.    Nonda Bays, [place]

## 2023-12-29 ENCOUNTER — Encounter (HOSPITAL_BASED_OUTPATIENT_CLINIC_OR_DEPARTMENT_OTHER): Payer: Self-pay | Admitting: Family Medicine

## 2023-12-30 ENCOUNTER — Other Ambulatory Visit (HOSPITAL_BASED_OUTPATIENT_CLINIC_OR_DEPARTMENT_OTHER): Payer: Self-pay | Admitting: Family Medicine

## 2023-12-30 MED ORDER — MECLIZINE HCL 25 MG PO TABS: 25.0000 mg | ORAL_TABLET | Freq: Three times a day (TID) | ORAL | 0 refills | Status: AC | PRN

## 2024-01-12 ENCOUNTER — Ambulatory Visit: Payer: Self-pay | Admitting: Internal Medicine

## 2024-01-12 DIAGNOSIS — R002 Palpitations: Secondary | ICD-10-CM | POA: Diagnosis not present

## 2024-02-03 ENCOUNTER — Ambulatory Visit (HOSPITAL_BASED_OUTPATIENT_CLINIC_OR_DEPARTMENT_OTHER): Payer: Self-pay | Admitting: Family Medicine

## 2024-02-16 DIAGNOSIS — Z8774 Personal history of (corrected) congenital malformations of heart and circulatory system: Secondary | ICD-10-CM | POA: Diagnosis not present

## 2024-02-16 DIAGNOSIS — I371 Nonrheumatic pulmonary valve insufficiency: Secondary | ICD-10-CM | POA: Diagnosis not present

## 2024-02-16 DIAGNOSIS — Q213 Tetralogy of Fallot: Secondary | ICD-10-CM | POA: Diagnosis not present

## 2024-02-16 DIAGNOSIS — I517 Cardiomegaly: Secondary | ICD-10-CM | POA: Diagnosis not present

## 2024-02-23 ENCOUNTER — Other Ambulatory Visit: Payer: Self-pay | Admitting: Internal Medicine

## 2024-04-04 ENCOUNTER — Encounter (INDEPENDENT_AMBULATORY_CARE_PROVIDER_SITE_OTHER): Payer: Self-pay | Admitting: Family Medicine

## 2024-04-04 DIAGNOSIS — J0140 Acute pansinusitis, unspecified: Secondary | ICD-10-CM

## 2024-04-04 NOTE — Telephone Encounter (Signed)
 Please see mychart message sent by pt and advise.  I did tell pt that she probably would need to come in for an appt.

## 2024-04-05 ENCOUNTER — Encounter (HOSPITAL_BASED_OUTPATIENT_CLINIC_OR_DEPARTMENT_OTHER): Payer: Self-pay | Admitting: Family Medicine

## 2024-04-05 MED ORDER — AMOXICILLIN-POT CLAVULANATE 875-125 MG PO TABS: 1.0000 | ORAL_TABLET | Freq: Two times a day (BID) | ORAL | 0 refills | Status: AC

## 2024-05-22 ENCOUNTER — Other Ambulatory Visit: Payer: Self-pay | Admitting: Internal Medicine

## 2024-05-23 ENCOUNTER — Other Ambulatory Visit: Payer: Self-pay | Admitting: Internal Medicine

## 2024-05-24 MED ORDER — PANTOPRAZOLE SODIUM 40 MG PO TBEC: DELAYED_RELEASE_TABLET | ORAL | 0 refills | Status: DC

## 2024-05-30 ENCOUNTER — Ambulatory Visit
Admission: RE | Admit: 2024-05-30 | Discharge: 2024-05-30 | Disposition: A | Discharge status: Home / Self Care | Attending: Family Medicine | Admitting: Family Medicine

## 2024-05-30 VITALS — BP 131/77 | HR 56 | Temp 98.7°F | Resp 18 | Ht 62.0 in | Wt 207.0 lb

## 2024-05-30 DIAGNOSIS — J0100 Acute maxillary sinusitis, unspecified: Secondary | ICD-10-CM | POA: Diagnosis not present

## 2024-05-30 DIAGNOSIS — H109 Unspecified conjunctivitis: Secondary | ICD-10-CM

## 2024-05-30 MED ORDER — AMOXICILLIN-POT CLAVULANATE 875-125 MG PO TABS: 1.0000 | ORAL_TABLET | Freq: Two times a day (BID) | ORAL | 0 refills | Status: AC

## 2024-05-30 MED ORDER — MOXIFLOXACIN HCL 0.5 % OP SOLN: 1.0000 [drp] | Freq: Three times a day (TID) | OPHTHALMIC | 0 refills | Status: AC

## 2024-05-30 NOTE — ED Provider Notes (Signed)
 Tonya Gardner    CSN: [id number] Arrival date & time: 05/30/24  0919      History   Chief Complaint Chief Complaint  Patient presents with   Conjunctivitis    Entered by patient    HPI Tonya Gardner is a 53 y.o. female.   HPI 53 year old female presents with right eye matted shut upon awakening this morning.  Patient reports battling upper respiratory infection for 1 week with cough, and congestion.  Reports her fever broke 3 days ago. PMH significant for obesity, TOF, SVT, and A-fib (history of ablation).  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   Chest pain 03/02/2023   Abnormal echocardiogram    Shortness of breath    Paroxysmal A-fib (HCC) 06/02/2020   Supraventricular tachycardia 06/02/2020   History of CHF (congestive heart failure) 06/02/2020   Hyperglycemia 06/02/2020   Obesity (BMI 30-39.9) 06/02/2020   History of tetralogy of Fallot 06/02/2020   Menorrhagia 08/12/2014   Essential hypertension, benign 10/16/2009   Cardiac pacemaker in situ 10/16/2009   SINUS BRADYCARDIA 10/13/2009   CHF 10/13/2009    Past Surgical History:  Procedure Laterality Date   ABDOMINAL HYSTERECTOMY N/A 08/12/2014   Procedure: HYSTERECTOMY ABDOMINAL;  Surgeon: Rosaline LITTIE Cobble, MD;  Location: [facility];  Service: Gynecology;  Laterality: N/A;   CARDIAC VALVE REPLACEMENT   04/12/2023   Pulmonary   CESAREAN SECTION     LAPAROSCOPIC ASSISTED VAGINAL HYSTERECTOMY N/A 08/12/2014   Procedure: ATTEMPTED LAPAROSCOPIC ASSISTED VAGINAL HYSTERECTOMY;  Surgeon: Rosaline LITTIE Cobble, MD;  Location: [facility];  Service: Gynecology;  Laterality: N/A;   PACEMAKER INSERTION     [employer]    RIGHT/LEFT HEART CATH AND CORONARY ANGIOGRAPHY N/A 07/01/2020   Procedure: RIGHT/LEFT HEART CATH AND CORONARY ANGIOGRAPHY;  Surgeon: Dann Candyce GORMAN, MD;  Location: [facility] INVASIVE CV LAB;  Service: Cardiovascular;  Laterality: N/A;   SALPINGOOPHORECTOMY Bilateral 08/12/2014   Procedure: SALPINGO OOPHORECTOMY;  Surgeon: Rosaline LITTIE Cobble, MD;  Location: [facility];  Service: Gynecology;  Laterality: Bilateral;   SVT ABLATION N/A 08/21/2020   Procedure: SVT ABLATION;  Surgeon: Waddell Danelle ORN, MD;  Location: [facility] INVASIVE CV LAB;  Service: Cardiovascular;  Laterality: N/A;   TETRALOGY OF FALLOT REPAIR     TONSILLECTOMY     TUBAL LIGATION  2002    OB History     Gravida  3   Para      Term      Preterm      AB      Living         SAB      IAB      Ectopic      Multiple      Live Births  2            Home Medications  Prior to Admission medications   Medication Sig Start Date End Date Taking? Authorizing Provider  amoxicillin -clavulanate (AUGMENTIN ) 875-125 MG tablet Take 1 tablet by mouth every 12 (twelve) hours. 05/30/24  Yes Teddy Sharper, FNP  fluticasone  (FLONASE ) 50 MCG/ACT nasal spray Place 1 spray into both nostrils 2 (two) times daily. 08/24/23  Yes Stuart Vernell Norris, PA-C  ibuprofen  (ADVIL ) 200 MG tablet Take 400 mg by mouth every 8 (eight) hours as needed for mild pain or moderate pain (pain.).   Yes [provider]  levocetirizine (XYZAL) 5 MG tablet Take 5 mg by mouth daily as needed for allergies. 01/26/18  Yes [provider]  meclizine  (ANTIVERT ) 25 MG tablet Take 1 tablet (25 mg total) by mouth 3 (three) times daily as needed for  dizziness. 12/30/23  Yes Caudle, Thersia Bitters, FNP  metoprolol  succinate (TOPROL -XL) 25 MG 24 hr tablet Take 1 tablet (25 mg total) by mouth at bedtime. 12/13/23  Yes Okey Vina GAILS, MD  moxifloxacin (VIGAMOX) 0.5 % ophthalmic solution Place 1 drop into the right eye 3 (three) times daily for 5 days. 05/30/24 06/04/24 Yes Teddy Sharper, FNP  pantoprazole  (PROTONIX ) 40 MG tablet TAKE 1 TABLET(40 MG) BY MOUTH DAILY 05/24/24  Yes Okey Vina GAILS, MD  predniSONE  (DELTASONE ) 20 MG tablet Take 2 tablets (40 mg total) by mouth daily with breakfast. 08/24/23  Yes Stuart Vernell Norris, PA-C  promethazine -dextromethorphan (PROMETHAZINE -DM) 6.25-15 MG/5ML syrup Take 5 mLs by mouth 4 (four) times daily as needed. 08/24/23  Yes Stuart Vernell Norris, PA-C  rosuvastatin  (CRESTOR ) 10 MG tablet TAKE 1 TABLET(10 MG) BY MOUTH DAILY 05/22/24  Yes Okey Vina GAILS, MD  sacubitril -valsartan  (ENTRESTO ) 24-26 MG TAKE 1 TABLET BY MOUTH TWICE DAILY 05/22/24  Yes Okey Vina GAILS, MD    Family History Family History  Problem Relation Age of Onset   Drug abuse Mother    Heart disease Mother    Alcohol  abuse Father    Heart disease Father    Arthritis Maternal Grandmother    Anxiety disorder Daughter     Social History Social History   Tobacco Use   Smoking status: Former    Types: Cigarettes   Smokeless tobacco: Never  Vaping Use   Vaping status: Never Used  Substance Use Topics   Alcohol  use: Yes    Alcohol /week: 1.0 standard drink of alcohol     Types: 1 Standard drinks or equivalent per week    Comment: occasionally   Drug use: No     Allergies   Sulfonamide derivatives and Latex   Review of Systems Review of Systems  HENT:  Positive for congestion, postnasal drip, sinus pressure and sinus pain.   Eyes:  Positive for discharge.  All other systems reviewed and are negative.    Physical Exam Triage Vital Signs ED Triage Vitals  Encounter Vitals Group     BP      Girls Systolic BP Percentile      Girls  Diastolic BP Percentile      Boys Systolic BP Percentile      Boys Diastolic BP Percentile      Pulse      Resp      Temp      Temp src      SpO2      Weight      Height      Head Circumference      Peak Flow      Pain Score      Pain Loc  Pain Education      Exclude from Growth Chart    No data found.  Updated Vital Signs BP 131/77 (BP Location: Right Arm)   Pulse (!) 56   Temp 98.7 F (37.1 C) (Oral)   Resp 18   Ht 5' 2 (1.575 m)   Wt 207 lb (93.9 kg)   LMP 11/20/2013   SpO2 98%   BMI 37.86 kg/m    Physical Exam Vitals and nursing note reviewed.  Constitutional:      General: She is not in acute distress.    Appearance: Normal appearance. She is obese. She is not ill-appearing.  HENT:     Head: Normocephalic and atraumatic.     Right Ear: External ear normal.     Left Ear: External ear normal.     Ears:     Comments: Bilateral EAC's with moderate compression; TM's clear, mobile with good light reflex bilaterally    Nose:     Right Sinus: Maxillary sinus tenderness present.     Left Sinus: Maxillary sinus tenderness present.     Comments: Turbinates are erythematous/edematous    Mouth/Throat:     Mouth: Mucous membranes are moist.     Pharynx: Oropharynx is clear.  Eyes:     Extraocular Movements: Extraocular movements intact.     Conjunctiva/sclera: Conjunctivae normal.     Pupils: Pupils are equal, round, and reactive to light.     Comments: Right eye: Sclera with +2 injection, mucopurulent discharge at inner canthus  Cardiovascular:     Rate and Rhythm: Normal rate and regular rhythm.     Pulses: Normal pulses.     Heart sounds: Normal heart sounds.  Pulmonary:     Effort: Pulmonary effort is normal.     Breath sounds: Normal breath sounds. No wheezing, rhonchi or rales.  Musculoskeletal:        General: Normal range of motion.     Cervical back: Normal range of motion and neck supple.  Skin:    General: Skin is warm and dry.  Neurological:      General: No focal deficit present.     Mental Status: She is alert and oriented to person, place, and time. Mental status is at baseline.  Psychiatric:        Mood and Affect: Mood normal.        Behavior: Behavior normal.      UC Treatments / Results  Labs (all labs ordered are listed, but only abnormal results are displayed) Labs Reviewed - No data to display  EKG   Radiology No results found.  Procedures Procedures (including critical Gardner time)  Medications Ordered in UC Medications - No data to display  Initial Impression / Assessment and Plan / UC Course  I have reviewed the triage vital signs and the nursing notes.  Pertinent labs & imaging results that were available during my Gardner of the patient were reviewed by me and considered in my medical decision making (see chart for details).     MDM: 1.  Acute maxillary sinusitis, recurrence not specified-Rx'd Augmentin  875/125 mg tablet: Take 1 tablet twice daily x 7 days; 2.  Conjunctivitis of right eye, unspecified conjunctivitis type-Rx Vigamox 0.5% ophthalmic solution: Place 1 drop into right eye 3 times daily for the next 5 days. Advised patient to take medications as directed with food to completion.  Advised patient to instill Vigamox into right eye as directed.  Advised if left eye develops similar symptoms may use  Vigamox in left eye as well.  Encouraged increase daily water intake to 64 ounces per day while taking these medications.  Advised if symptoms worsen and/or unresolved please call your PCP, ophthalmology/optometrist or here for further evaluation.  Patient discharged home, hemodynamically stable Final Clinical Impressions(s) / UC Diagnoses   Final diagnoses:  Conjunctivitis of right eye, unspecified conjunctivitis type  Acute maxillary sinusitis, recurrence not specified     Discharge Instructions      Advised patient to take medications as directed with food to completion.  Advised patient to  instill Vigamox into right eye as directed.  Advised if left eye develops similar symptoms may use Vigamox in left eye as well.  Encouraged increase daily water intake to 64 ounces per day while taking these medications.  Advised if symptoms worsen and/or unresolved please call your PCP, ophthalmology/optometrist or here for further evaluation.     ED Prescriptions     Medication Sig Dispense Auth. Provider   moxifloxacin (VIGAMOX) 0.5 % ophthalmic solution Place 1 drop into the right eye 3 (three) times daily for 5 days. 3 mL Pharrah Rottman, FNP   amoxicillin -clavulanate (AUGMENTIN ) 875-125 MG tablet Take 1 tablet by mouth every 12 (twelve) hours. 14 tablet Sharlynn Seckinger, FNP      PDMP not reviewed this encounter.   Teddy Sharper, FNP 05/30/24 1151

## 2024-05-30 NOTE — ED Triage Notes (Signed)
 Patient c/o waking up this morning with her right eye matted together, no drainage.  Patient has been battling an URI x 1 week, cough and congestion.  Fever broke on Saturday.  Patient has taken Dayquil and Nyquil.

## 2024-05-30 NOTE — Discharge Instructions (Addendum)
 Advised patient to take medications as directed with food to completion.  Advised patient to instill Vigamox into right eye as directed.  Advised if left eye develops similar symptoms may use Vigamox in left eye as well.  Encouraged increase daily water intake to 64 ounces per day while taking these medications.  Advised if symptoms worsen and/or unresolved please call your PCP, ophthalmology/optometrist or here for further evaluation.

## 2024-05-31 ENCOUNTER — Telehealth: Payer: Self-pay

## 2024-05-31 NOTE — Telephone Encounter (Signed)
 Follow up call was placed regarding visit. Pt states that the medication is working and has no complaints. LM

## 2024-06-26 ENCOUNTER — Other Ambulatory Visit: Payer: Self-pay | Admitting: Internal Medicine

## 2024-06-27 MED ORDER — METOPROLOL SUCCINATE ER 25 MG PO TB24: 25.0000 mg | ORAL_TABLET | Freq: Every day | ORAL | 0 refills | Status: DC

## 2024-07-13 ENCOUNTER — Ambulatory Visit (INDEPENDENT_AMBULATORY_CARE_PROVIDER_SITE_OTHER): Payer: Managed Care, Other (non HMO) | Admitting: Family Medicine

## 2024-07-13 ENCOUNTER — Encounter (HOSPITAL_BASED_OUTPATIENT_CLINIC_OR_DEPARTMENT_OTHER): Payer: Self-pay | Admitting: Family Medicine

## 2024-07-13 VITALS — BP 129/62 | HR 60 | Ht 62.0 in | Wt 210.0 lb

## 2024-07-13 DIAGNOSIS — R7303 Prediabetes: Secondary | ICD-10-CM | POA: Diagnosis not present

## 2024-07-13 DIAGNOSIS — E669 Obesity, unspecified: Secondary | ICD-10-CM

## 2024-07-13 DIAGNOSIS — J0181 Other acute recurrent sinusitis: Secondary | ICD-10-CM

## 2024-07-13 DIAGNOSIS — Z23 Encounter for immunization: Secondary | ICD-10-CM

## 2024-07-13 DIAGNOSIS — Z13220 Encounter for screening for lipoid disorders: Secondary | ICD-10-CM | POA: Diagnosis not present

## 2024-07-13 DIAGNOSIS — Z0000 Encounter for general adult medical examination without abnormal findings: Secondary | ICD-10-CM

## 2024-07-13 DIAGNOSIS — Z1211 Encounter for screening for malignant neoplasm of colon: Secondary | ICD-10-CM

## 2024-07-13 MED ORDER — AMOXICILLIN-POT CLAVULANATE 875-125 MG PO TABS: 1.0000 | ORAL_TABLET | Freq: Two times a day (BID) | ORAL | 0 refills | Status: AC

## 2024-07-13 NOTE — Patient Instructions (Signed)

## 2024-07-13 NOTE — Progress Notes (Signed)
 Subjective:   DELISA FINCK Dec 29, 1970  07/13/2024   CC: Chief Complaint  Patient presents with   Annual Exam    Pt is here today for her physical. Wants to discuss weight loss options.    HPI: LEISL SPURRIER is a 53 y.o. female who presents for a routine health maintenance exam.  Labs collected at time of visit.    LEFT EAR: Patient reports pain to left ear ongoing x 1 week. She has recurrent sinus infections and recurrent AOM. Reports sinus pressure and fullness to ears with nasal drainage.   WEIGHT MANAGEMENT: Karryn S Heyliger presents for weight management. Patient has increased walking,exercise and controlling diet. She is interested in weight loss programs and accountability for weight management.  Medications tried in the past: None Wt Readings from Last 3 Encounters:  07/13/24 210 lb (95.3 kg)  05/30/24 207 lb (93.9 kg)  12/28/23 212 lb 9.6 oz (96.4 kg)    HEALTH SCREENINGS: - Vision Screening: up to date - Dental Visits: up to date - Pap smear: not applicable - Breast Exam: Declined - STD Screening: Declined - Mammogram (40+): Refused  - Colonoscopy (45+): Cologuard ordered today   - Bone Density (65+ or under 65 with predisposing conditions): Not applicable  - Lung CA screening with low-dose CT:  Not applicable Adults age 2-80 who are current cigarette smokers or quit within the last 15 years. Must have 20 pack year history.   Depression and Anxiety Screen done today and results listed below:     07/13/2024    8:15 AM 12/28/2023   10:22 AM 07/08/2023    8:15 AM 04/27/2023    9:00 AM  Depression screen PHQ 2/9  Decreased Interest 0 0 0 0  Down, Depressed, Hopeless 0 0 0 0  PHQ - 2 Score 0 0 0 0  Altered sleeping 1 1  1   Tired, decreased energy 1 1  1   Change in appetite 0 0  0  Feeling bad or failure about yourself  0 0  0  Trouble concentrating 0 0  0  Moving slowly or fidgety/restless 0 0  0  Suicidal thoughts 0 0  0  PHQ-9 Score 2 2   2     Difficult doing work/chores Not difficult at all Not difficult at all  Not difficult at all     Data saved with a previous flowsheet row definition      07/13/2024    8:16 AM 12/28/2023   10:22 AM 04/27/2023    9:00 AM  GAD 7 : Generalized Anxiety Score  Nervous, Anxious, on Edge 0 0 1  Control/stop worrying 0 0 1  Worry too much - different things 0 0 1  Trouble relaxing 0 0 1  Restless 0 0 1  Easily annoyed or irritable 0 0 0  Afraid - awful might happen 0 0 0  Total GAD 7 Score 0 0 5  Anxiety Difficulty Not difficult at all Not difficult at all Not difficult at all    IMMUNIZATIONS: - Tdap: Tetanus vaccination status reviewed: Td vaccination indicated and given today. - HPV: Not applicable - Influenza: Administered today - Pneumovax: Recommended - Prevnar 20: Recommended - Shingrix (50+): Recommended   Past medical history, surgical history, medications, allergies, family history and social history reviewed with patient today and changes made to appropriate areas of the chart.   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    Past Surgical History:  Procedure Laterality Date   ABDOMINAL HYSTERECTOMY N/A 08/12/2014   Procedure: HYSTERECTOMY ABDOMINAL;  Surgeon: Rosaline LITTIE Cobble, MD;  Location: [facility];  Service: Gynecology;  Laterality: N/A;   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   Pulmonary   CESAREAN SECTION     LAPAROSCOPIC ASSISTED VAGINAL HYSTERECTOMY N/A 08/12/2014   Procedure: ATTEMPTED LAPAROSCOPIC ASSISTED VAGINAL  HYSTERECTOMY;  Surgeon: Rosaline LITTIE Cobble, MD;  Location: [facility];  Service: Gynecology;  Laterality: N/A;   PACEMAKER INSERTION     [employer]    RIGHT/LEFT HEART CATH AND CORONARY ANGIOGRAPHY N/A 07/01/2020   Procedure: RIGHT/LEFT HEART CATH AND CORONARY ANGIOGRAPHY;  Surgeon: Dann Candyce RAMAN, MD;  Location: [facility] INVASIVE CV LAB;  Service: Cardiovascular;  Laterality: N/A;   SALPINGOOPHORECTOMY Bilateral 08/12/2014   Procedure: SALPINGO OOPHORECTOMY;  Surgeon: Rosaline LITTIE Cobble, MD;  Location: [facility];  Service: Gynecology;  Laterality: Bilateral;   SVT ABLATION N/A 08/21/2020   Procedure: SVT ABLATION;  Surgeon: Waddell Danelle ORN, MD;  Location: [facility]VAS[facility] CV LAB;  Service: Cardiovascular;  Laterality: N/A;   TETRALOGY OF FALLOT REPAIR     TONSILLECTOMY     TUBAL LIGATION  2002    Current Outpatient Medications on File Prior to Visit  Medication Sig   fluticasone  (FLONASE ) 50 MCG/ACT nasal spray Place 1 spray into both nostrils 2 (two) times daily.   ibuprofen  (ADVIL ) 200 MG tablet Take 400 mg by mouth every 8 (eight) hours as needed for mild pain or moderate pain (pain.).   levocetirizine (XYZAL) 5 MG tablet Take 5 mg by mouth daily as needed for allergies.   meclizine  (ANTIVERT ) 25 MG tablet Take 1 tablet (25 mg total) by mouth 3 (three) times daily as needed for dizziness.   metoprolol  succinate (TOPROL -XL) 25 MG 24 hr tablet Take 1 tablet (25 mg total) by mouth at bedtime.   pantoprazole  (PROTONIX ) 40 MG tablet TAKE 1 TABLET(40 MG) BY MOUTH DAILY   rosuvastatin  (CRESTOR ) 10 MG tablet TAKE 1 TABLET(10 MG) BY MOUTH DAILY   sacubitril -valsartan  (ENTRESTO ) 24-26 MG TAKE 1 TABLET BY MOUTH TWICE DAILY   amoxicillin -clavulanate (AUGMENTIN ) 875-125 MG tablet Take 1 tablet by mouth every 12 (twelve) hours.   predniSONE  (DELTASONE ) 20 MG tablet Take 2 tablets (40 mg total) by mouth daily with breakfast.   promethazine -dextromethorphan (PROMETHAZINE -DM) 6.25-15 MG/5ML syrup Take 5 mLs by mouth 4  (four) times daily as needed.   No current facility-administered medications on file prior to visit.    Allergies  Allergen Reactions   Sulfonamide Derivatives Hives   Latex Rash     Social History   Socioeconomic History   Marital status: Divorced    Spouse name: Not on file   Number of children: Not on file   Years of education: Not on file   Highest education level: Associate degree: occupational, scientist, product/process development, or vocational program  Occupational History   Not on file  Tobacco Use   Smoking status: Former  Types: Cigarettes   Smokeless tobacco: Never  Vaping Use   Vaping status: Never Used  Substance and Sexual Activity   Alcohol  use: Yes    Alcohol /week: 1.0 standard drink of alcohol     Types: 1 Standard drinks or equivalent per week    Comment: occasionally   Drug use: No   Sexual activity: Not Currently    Birth control/protection: Abstinence, Other-see comments, None    Comment: Full hysterectomy  Other Topics Concern   Not on file  Social History Narrative   Not on file   Social Drivers of Health   Financial Resource Strain: Low Risk  (07/08/2023)   Overall Financial Resource Strain (CARDIA)    Difficulty of Paying Living Expenses: Not very hard  Food Insecurity: No Food Insecurity (07/08/2023)   Hunger Vital Sign    Worried About Running Out of Food in the Last Year: Never true    Ran Out of Food in the Last Year: Never true  Transportation Needs: No Transportation Needs (07/08/2023)   PRAPARE - Administrator, Civil Service (Medical): No    Lack of Transportation (Non-Medical): No  Physical Activity: Insufficiently Active (07/08/2023)   Exercise Vital Sign    Days of Exercise per Week: 2 days    Minutes of Exercise per Session: 60 min  Stress: Stress Concern Present (07/08/2023)   [employer] of Occupational Health - Occupational Stress Questionnaire    Feeling of Stress : To some extent  Social Connections: Socially Isolated  (07/08/2023)   Social Connection and Isolation Panel    Frequency of Communication with Friends and Family: More than three times a week    Frequency of Social Gatherings with Friends and Family: Once a week    Attends Religious Services: Never    Database Administrator or Organizations: No    Attends Engineer, Structural: Not on file    Marital Status: Divorced  Catering Manager Violence: Not on file   Social History   Tobacco Use  Smoking Status Former   Types: Cigarettes  Smokeless Tobacco Never   Social History   Substance and Sexual Activity  Alcohol  Use Yes   Alcohol /week: 1.0 standard drink of alcohol    Types: 1 Standard drinks or equivalent per week   Comment: occasionally    Family History  Problem Relation Age of Onset   Drug abuse Mother    Heart disease Mother    Alcohol  abuse Father    Heart disease Father    Arthritis Maternal Grandmother    Anxiety disorder Daughter      ROS: Denies fever, fatigue, unexplained weight loss/gain, chest pain, SHOB, and palpitations. Denies neurological deficits, gastrointestinal or genitourinary complaints, and skin changes.   Objective:   Today's Vitals   07/13/24 0813  BP: 129/62  Pulse: 60  SpO2: 98%  Weight: 210 lb (95.3 kg)  Height: 5' 2 (1.575 m)    GENERAL APPEARANCE: Well-appearing, in NAD. Well nourished.  SKIN: Pink, warm and dry. Turgor normal. No rash, lesion, ulceration, or ecchymoses. Hair evenly distributed.  HEENT: HEAD: Normocephalic.  EYES: PERRLA. EOMI. Lids intact w/o defect. Sclera white, Conjunctiva pink w/o exudate.  EARS: External ear w/o redness, swelling, masses or lesions. EAC clear. TM's intact, translucent w/o bulging, appropriate landmarks visualized. Appropriate acuity to conversational tones.  NOSE: Septum midline w/o deformity. Nares patent, mucosa pink and non-inflamed w/o drainage. No sinus tenderness.  THROAT: Uvula midline. Oropharynx clear. Tonsils non-inflamed w/o  exudate. Oral mucosa  pink and moist.  NECK: Supple, Trachea midline. Full ROM w/o pain or tenderness. No lymphadenopathy. Thyroid non-tender w/o enlargement or palpable masses.  RESPIRATORY: Chest wall symmetrical w/o masses. Respirations even and non-labored. Breath sounds clear to auscultation bilaterally. No wheezes, rales, rhonchi, or crackles. CARDIAC: S1, S2 present, regular rate and rhythm. No gallops, murmurs, rubs, or clicks. PMI w/o lifts, heaves, or thrills. No carotid bruits. Capillary refill <2 seconds. Peripheral pulses 2+ bilaterally. GI: Abdomen soft w/o distention. Normoactive bowel sounds. No palpable masses or tenderness. No guarding or rebound tenderness. Liver and spleen w/o tenderness or enlargement. No CVA tenderness.  MSK: Muscle tone and strength appropriate for age, w/o atrophy or abnormal movement.  EXTREMITIES: Active ROM intact, w/o tenderness, crepitus, or contracture. No obvious joint deformities or effusions. No clubbing, edema, or cyanosis.  NEUROLOGIC: CN's II-XII intact. Motor strength symmetrical with no obvious weakness. No sensory deficits. DTR's 2+ symmetric bilaterally. Steady, even gait.  PSYCH/MENTAL STATUS: Alert, oriented x 3. Cooperative, appropriate mood and affect.     Assessment & Plan:  1. Screening for colon cancer (Primary) - Cologuard  2. Annual physical exam Discussed preventative screenings, vaccines, and healthy lifestyle with patient. Fasting labs completed today.  Patient declined mammogram.  We discussed the risk of this means means there could be underlying issue or malignancy not identified, increased morbidity, and even mortality. Patient verbalized understanding and acceptance of risk. Patient knows they can change their mind at any time and we will be happy to coordinate these things for them.   - CBC with Differential/Platelet - Comprehensive metabolic panel with GFR - Lipid panel - TSH  3. Prediabetes Managed by diet,  exercise. Will obtain A1C today.  - Hemoglobin A1c  4. Immunization due - Tdap vaccine greater than or equal to 7yo IM - Flu vaccine trivalent PF, 6mos and older(Flulaval,Afluria,Fluarix,Fluzone)  5. Obesity (BMI 30-39.9) Patient desiring greater guidance in dietary management. Referral to [facility]WM placed per patient request.  - Amb Ref to Medical Weight Management  6. Screening for lipid disorders - Lipid panel   Orders Placed This Encounter  Procedures   Tdap vaccine greater than or equal to 7yo IM   Flu vaccine trivalent PF, 6mos and older(Flulaval,Afluria,Fluarix,Fluzone)   Cologuard   CBC with Differential/Platelet   Comprehensive metabolic panel with GFR   Lipid panel   TSH   Hemoglobin A1c   Amb Ref to Medical Weight Management    Referral Priority:   Routine    Referral Type:   Consultation    Number of Visits Requested:   1    PATIENT COUNSELING:  - Encouraged a healthy well-balanced diet. Patient may adjust caloric intake to maintain or achieve ideal body weight. May reduce intake of dietary saturated fat and total fat and have adequate dietary potassium and calcium  preferably from fresh fruits, vegetables, and low-fat dairy products.   - Advised to avoid cigarette smoking. - Discussed with the patient that most people either abstain from alcohol  or drink within safe limits (<=14/week and <=4 drinks/occasion for males, <=7/weeks and <= 3 drinks/occasion for females) and that the risk for alcohol  disorders and other health effects rises proportionally with the number of drinks per week and how often a drinker exceeds daily limits. - Discussed cessation/primary prevention of drug use and availability of treatment for abuse.  - Discussed sexually transmitted diseases, avoidance of unintended pregnancy and contraceptive alternatives.  - Stressed the importance of regular exercise - Injury prevention: Discussed safety belts, safety helmets,  smoke detector, smoking near bedding  or upholstery.  - Dental health: Discussed importance of regular tooth brushing, flossing, and dental visits.   NEXT PREVENTATIVE PHYSICAL DUE IN 1 YEAR.  Return in about 1 year (around 07/13/2025) for ANNUAL PHYSICAL.  Patient to reach out to office if new, worrisome, or unresolved symptoms arise or if no improvement in patient's condition. Patient verbalized understanding and is agreeable to treatment plan. All questions answered to patient's satisfaction.    Thersia Schuyler Stark, [place]

## 2024-07-14 LAB — COMPREHENSIVE METABOLIC PANEL WITH GFR
ALT: 13 IU/L (ref 0–32)
AST: 14 IU/L (ref 0–40)
Albumin: 4.4 g/dL (ref 3.8–4.9)
Alkaline Phosphatase: 78 IU/L (ref 49–135)
BUN/Creatinine Ratio: 20 (ref 9–23)
BUN: 17 mg/dL (ref 6–24)
Bilirubin Total: 0.4 mg/dL (ref 0.0–1.2)
CO2: 23 mmol/L (ref 20–29)
Calcium: 9.7 mg/dL (ref 8.7–10.2)
Chloride: 106 mmol/L (ref 96–106)
Creatinine, Ser: 0.83 mg/dL (ref 0.57–1.00)
Globulin, Total: 2.4 g/dL (ref 1.5–4.5)
Glucose: 86 mg/dL (ref 70–99)
Potassium: 5 mmol/L (ref 3.5–5.2)
Sodium: 142 mmol/L (ref 134–144)
Total Protein: 6.8 g/dL (ref 6.0–8.5)
eGFR: 84 mL/min/1.73 (ref >59)

## 2024-07-14 LAB — CBC WITH DIFFERENTIAL/PLATELET
Basophils Absolute: 0.1 x10E3/uL (ref 0.0–0.2)
Basos: 1 %
EOS (ABSOLUTE): 0.2 x10E3/uL (ref 0.0–0.4)
Eos: 3 %
Hematocrit: 40.1 % (ref 34.0–46.6)
Hemoglobin: 12.9 g/dL (ref 11.1–15.9)
Immature Grans (Abs): 0 x10E3/uL (ref 0.0–0.1)
Immature Granulocytes: 0 %
Lymphocytes Absolute: 1.6 x10E3/uL (ref 0.7–3.1)
Lymphs: 34 %
MCH: 28.7 pg (ref 26.6–33.0)
MCHC: 32.2 g/dL (ref 31.5–35.7)
MCV: 89 fL (ref 79–97)
Monocytes Absolute: 0.4 x10E3/uL (ref 0.1–0.9)
Monocytes: 8 %
Neutrophils Absolute: 2.5 x10E3/uL (ref 1.4–7.0)
Neutrophils: 54 %
Platelets: 239 x10E3/uL (ref 150–450)
RBC: 4.49 x10E6/uL (ref 3.77–5.28)
RDW: 13.8 % (ref 11.7–15.4)
WBC: 4.8 x10E3/uL (ref 3.4–10.8)

## 2024-07-14 LAB — LIPID PANEL
Chol/HDL Ratio: 2.4 ratio (ref 0.0–4.4)
Cholesterol, Total: 187 mg/dL (ref 100–199)
HDL: 79 mg/dL (ref >39)
LDL Chol Calc (NIH): 90 mg/dL (ref 0–99)
Triglycerides: 102 mg/dL (ref 0–149)
VLDL Cholesterol Cal: 18 mg/dL (ref 5–40)

## 2024-07-14 LAB — TSH: TSH: 1.67 u[IU]/mL (ref 0.450–4.500)

## 2024-07-14 LAB — HEMOGLOBIN A1C
Est. average glucose Bld gHb Est-mCnc: 117 mg/dL
Hgb A1c MFr Bld: 5.7 % — ABNORMAL HIGH (ref 4.8–5.6)

## 2024-07-16 ENCOUNTER — Ambulatory Visit (HOSPITAL_BASED_OUTPATIENT_CLINIC_OR_DEPARTMENT_OTHER): Payer: Self-pay | Admitting: Family Medicine

## 2024-07-16 DIAGNOSIS — R7303 Prediabetes: Secondary | ICD-10-CM

## 2024-07-16 NOTE — Progress Notes (Signed)
 Hi Missy,  Your A1c is in the prediabetes range at 5.7 which has improved from 5.8. Your cholesterol is currently well controlled and your thyroid is stable. Your CBC and CMP are within normal range.

## 2024-07-29 DIAGNOSIS — Z1211 Encounter for screening for malignant neoplasm of colon: Secondary | ICD-10-CM | POA: Diagnosis not present

## 2024-08-01 NOTE — Progress Notes (Deleted)
 Appointment cancelled

## 2024-08-02 ENCOUNTER — Encounter: Payer: Self-pay | Admitting: Nurse Practitioner

## 2024-08-02 ENCOUNTER — Ambulatory Visit: Admitting: Nurse Practitioner

## 2024-08-02 VITALS — HR 76 | Ht 61.5 in | Wt 209.0 lb

## 2024-08-02 DIAGNOSIS — E785 Hyperlipidemia, unspecified: Secondary | ICD-10-CM | POA: Diagnosis not present

## 2024-08-02 DIAGNOSIS — R7303 Prediabetes: Secondary | ICD-10-CM

## 2024-08-02 DIAGNOSIS — Z6838 Body mass index (BMI) 38.0-38.9, adult: Secondary | ICD-10-CM | POA: Diagnosis not present

## 2024-08-02 DIAGNOSIS — E669 Obesity, unspecified: Secondary | ICD-10-CM | POA: Diagnosis not present

## 2024-08-02 NOTE — Progress Notes (Signed)
 Office: [phone]  /  Fax: [phone]   Initial Visit  Tonya SONNIE Gardner was seen in clinic today to evaluate for obesity. She is interested in losing weight to improve overall health and reduce the risk of weight related complications. She presents today to review program treatment options, initial physical assessment, and evaluation.     She was referred by: PCP  When asked what else they would like to accomplish? She states: Adopt a healthier eating pattern and lifestyle, Improve energy levels and physical activity, Improve existing medical conditions, and Improve quality of life  When asked how has your weight affected you? She states: Contributed to medical problems, Contributed to orthopedic problems or mobility issues, Having fatigue, and Having poor endurance  Some associated conditions: History of CHF, HTN, a fib, sinus bradycardia, supraventricular tachycardia, abnormal echo, pacemaker, prediabetes, history of tetralogy of fallot, s/p transcatheter replacement of pulmonary valve, HLD  Contributing factors: family history of obesity, consumption of processed foods, moderate to high levels of stress, sedentary job, and hectic pace of life  Weight promoting medications identified: None  Current nutrition plan: None  Current level of physical activity: None  Current or previous pharmacotherapy: None  Response to medication: Never tried medications   Past medical history includes:   Past Medical History:  Diagnosis Date   Allergy    Sulpha drugs, latex   Anxiety 1990   Since my teen years   CHF (congestive heart failure) (HCC) 08/23/2000   with pregnancy   Depression 2024   Been a rough year, deaths and illness   GERD (gastroesophageal reflux disease)    Headache    Heart murmur    Presence of permanent cardiac pacemaker    off for 2 years   Seizures (HCC) 08/23/2000   due to eclampsia   Sinus bradycardia    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  Peak Weight:223lb , Body Fat%:50, Visceral Fat Rating:15, Weight trend over the last 12 months: Unchanged  General:  Alert, oriented and cooperative. Patient is in no acute distress.  Respiratory: Normal respiratory effort, no problems with respiration noted   Gait: able to ambulate independently  Mental Status: Normal mood and affect. Normal behavior. Normal judgment and thought content.   DIAGNOSTIC DATA REVIEWED:  BMET    Component Value Date/Time   NA 142 07/13/2024 0851   K 5.0 07/13/2024 0851   CL 106 07/13/2024 0851   CO2 23 07/13/2024 0851   GLUCOSE 86 07/13/2024 0851   GLUCOSE 72 03/03/2023 0235   BUN 17 07/13/2024 0851   CREATININE 0.83 07/13/2024 0851   CALCIUM  9.7 07/13/2024 0851   GFRNONAA >60 03/03/2023 0235   GFRAA 119 07/28/2020 1235   Lab Results  Component Value Date   HGBA1C 5.7 (H) 07/13/2024   HGBA1C 5.8 (H) 03/29/2022   No results found for: INSULIN CBC    Component Value Date/Time   WBC 4.8 07/13/2024 0851   WBC 5.1 03/03/2023 0235   RBC 4.49 07/13/2024 0851   RBC 4.54 03/03/2023 0235   HGB 12.9 07/13/2024 0851   HCT 40.1 07/13/2024 0851   PLT 239 07/13/2024 0851   MCV 89 07/13/2024 0851   MCH 28.7 07/13/2024  0851   MCH 28.2 03/03/2023 0235   MCHC 32.2 07/13/2024 0851   MCHC 32.0 03/03/2023 0235   RDW 13.8 07/13/2024 0851   Iron/TIBC/Ferritin/ %Sat No results found for: IRON, TIBC, FERRITIN, IRONPCTSAT Lipid Panel     Component Value Date/Time   CHOL 187 07/13/2024 0851   TRIG 102 07/13/2024 0851   HDL 79 07/13/2024 0851   CHOLHDL 2.4 07/13/2024 0851   LDLCALC 90 07/13/2024 0851   Hepatic Function Panel     Component Value Date/Time   PROT 6.8 07/13/2024 0851   ALBUMIN 4.4 07/13/2024 0851   AST 14 07/13/2024 0851   ALT  13 07/13/2024 0851   ALKPHOS 78 07/13/2024 0851   BILITOT 0.4 07/13/2024 0851   BILIDIR <0.10 10/26/2022 0807      Component Value Date/Time   TSH 1.670 07/13/2024 0851     Assessment and Plan:   Prediabetes Last A1c was 5.7 on 07/13/24, will continue to monitor  Hyperlipidemia, unspecified hyperlipidemia type Managed by PCP and cardiology.  Taking Crestor  10mg .    Obesity (BMI 30-39.9)        Obesity Treatment / Action Plan:  Patient will work on garnering support from family and friends to begin weight loss journey. Will work on eliminating or reducing the presence of highly palatable, calorie dense foods in the home. Will complete provided nutritional and psychosocial assessment questionnaire before the next appointment. Will be scheduled for indirect calorimetry to determine resting energy expenditure in a fasting state.  This will allow us  to create a reduced calorie, high-protein meal plan to promote loss of fat mass while preserving muscle mass. Counseled on the health benefits of losing 5%-15% of total body weight. Was counseled on nutritional approaches to weight loss and benefits of reducing processed foods and consuming plant-based foods and high quality protein as part of nutritional weight management. Was counseled on pharmacotherapy and role as an adjunct in weight management.   Obesity Education Performed Today:  She was weighed on the bioimpedance scale and results were discussed and documented in the synopsis.  We discussed obesity as a disease and the importance of a more detailed evaluation of all the factors contributing to the disease.  We discussed the importance of long term lifestyle changes which include nutrition, exercise and behavioral modifications as well as the importance of customizing this to her specific health and social needs.  We discussed the benefits of reaching a healthier weight to alleviate the symptoms of existing conditions and  reduce the risks of the biomechanical, metabolic and psychological effects of obesity.  Ajani S Palma appears to be in the action stage of change and states they are ready to start intensive lifestyle modifications and behavioral modifications.  30 minutes was spent today on this visit including the above counseling, pre-visit chart review, and post-visit documentation.  Reviewed by clinician on day of visit: allergies, medications, problem list, medical history, surgical history, family history, social history, and previous encounter notes pertinent to obesity diagnosis.    Corean SAUNDERS Rally Ouch FNP-C

## 2024-08-03 ENCOUNTER — Ambulatory Visit: Admitting: Internal Medicine

## 2024-08-03 LAB — COLOGUARD: COLOGUARD: NEGATIVE

## 2024-08-05 NOTE — Progress Notes (Signed)
Your cologuard test is negative for colon cancer from your fecal matter sample.    This is not an all inclusive test and is not as specific as a direct visualization of the colon with a colonoscopy. Cologuard thus may provide a false negative result. It is recommended to have a colonoscopy at least every 10 years or more frequently as indicated by a GI provider. You can repeat your Cologuard test in 3 years. If you would like to have a colonoscopy please call the office at any time and we would be happy to order this for you.

## 2024-09-05 ENCOUNTER — Encounter: Payer: Self-pay | Admitting: Bariatrics

## 2024-09-05 ENCOUNTER — Ambulatory Visit: Admitting: Bariatrics

## 2024-09-05 VITALS — BP 114/72 | HR 61 | Ht 61.5 in | Wt 211.0 lb

## 2024-09-05 DIAGNOSIS — R0602 Shortness of breath: Secondary | ICD-10-CM | POA: Diagnosis not present

## 2024-09-05 DIAGNOSIS — Z6839 Body mass index (BMI) 39.0-39.9, adult: Secondary | ICD-10-CM

## 2024-09-05 DIAGNOSIS — Z8679 Personal history of other diseases of the circulatory system: Secondary | ICD-10-CM

## 2024-09-05 DIAGNOSIS — E669 Obesity, unspecified: Secondary | ICD-10-CM | POA: Diagnosis not present

## 2024-09-05 DIAGNOSIS — E559 Vitamin D deficiency, unspecified: Secondary | ICD-10-CM | POA: Diagnosis not present

## 2024-09-05 DIAGNOSIS — E785 Hyperlipidemia, unspecified: Secondary | ICD-10-CM | POA: Diagnosis not present

## 2024-09-05 DIAGNOSIS — Z0000 Encounter for general adult medical examination without abnormal findings: Secondary | ICD-10-CM

## 2024-09-05 DIAGNOSIS — Z1331 Encounter for screening for depression: Secondary | ICD-10-CM

## 2024-09-05 DIAGNOSIS — R7303 Prediabetes: Secondary | ICD-10-CM | POA: Diagnosis not present

## 2024-09-05 DIAGNOSIS — R5383 Other fatigue: Secondary | ICD-10-CM | POA: Diagnosis not present

## 2024-09-05 NOTE — Progress Notes (Signed)
 "  At a Glance:  Vitals BP: 114/72 Pulse Rate: 61 SpO2: 99 %   Anthropometric Measurements Height: 5' 1.5 (1.562 m) Weight: 211 lb (95.7 kg) BMI (Calculated): 39.23 Starting Weight: 211lb Peak Weight: 223lb Waist Measurement : 48 inches   Body Composition  Body Fat %: 49.8 % Fat Mass (lbs): 105.2 lbs Muscle Mass (lbs): 100.8 lbs Total Body Water (lbs): 75 lbs Visceral Fat Rating : 15   Other Clinical Data RMR: 1411 Fasting: yes Labs: yes Today's Visit #: 1 Starting Date: 09/05/24     Indirect Calorimeter:   Resting Metabolic Rate ( RMR):  RMR (actual): 1411 kcal RMR (calculated): 1523 kcal The calculated basal metabolic rate is 8476 thus her basal metabolic rate is worse than expected.  Plan:   Indirect calorimeter completed, interpreted and reviewed with patient today and allowed to ask questions.  Discussed the implications for the chosen plan and exercise based on the RMR reading.  Will consider repeating the RMR in the future based on weight loss.    Chief Complaint:  Obesity   Subjective:  Tonya Gardner (MR# [id number]) is a 54 y.o. female who presents for evaluation and treatment of obesity and related comorbidities.   Tonya Gardner is currently in the action stage of change and ready to dedicate time achieving and maintaining a healthier weight. Tonya Gardner is interested in becoming our patient and working on intensive lifestyle modifications including (but not limited to) diet and exercise for weight loss.  Tonya Gardner has been struggling with her weight. She has been unsuccessful in either losing weight, maintaining weight loss, or reaching her healthy weight goal.  Tonya Gardner's habits were reviewed today and are as follows: she started gaining weight with her pregnancies, she is a picky eater and doesn't like to eat healthier foods, and she skips meals frequently.     Current or previous pharmacotherapy: None  Response to medication: Never tried  medications  Other Fatigue Tonya Gardner admits to daytime somnolence and admits to waking up still tired.  Tonya Gardner generally gets 4 hours of sleep per night, and states that she has difficulty falling asleep and difficulty falling back asleep if awakened. Snoring is present. Apneic episodes are not present. Epworth Sleepiness Score is 2.   Shortness of Breath Tonya Gardner notes increasing shortness of breath with exercising and seems to be worsening over time with weight gain. She notes getting out of breath sooner with activity than she used to. This has not gotten worse recently. Tonya Gardner denies shortness of breath at rest or orthopnea.  Depression Screen Tonya Gardner's Food and Mood (modified PHQ-9) score was 12. 10-14 moderate depression     09/05/2024    7:30 AM  Depression screen PHQ 2/9  Decreased Interest 1  Down, Depressed, Hopeless 1  PHQ - 2 Score 2  Altered sleeping 2  Tired, decreased energy 2  Change in appetite 2  Feeling bad or failure about yourself  0  Trouble concentrating 0  Moving slowly or fidgety/restless 0  Suicidal thoughts 0  PHQ-9 Score 8  Difficult doing work/chores Not difficult at all     Assessment and Plan:   Other Fatigue Tonya Gardner does feel that her weight is causing her energy to be lower than it should be. Fatigue may be related to obesity, depression or many other causes. Labs will be ordered, and in the meanwhile, Riva will focus on self care including making healthy food choices, increasing physical activity and focusing on stress reduction.  Shortness of Breath Tonya Gardner does  not feel that she gets out of breath more easily that she used to when she exercises. Tonya Gardner's shortness of breath appears to be obesity related and exercise induced. She has agreed to work on weight loss and gradually increase exercise to treat her exercise induced shortness of breath. Will continue to monitor closely.  Health Maintenance:   Obesity   Plan: Will do EKG, indirect  calorimetry, and labs.     Vitamin D  Deficiency She is at risk for vitamin D  deficiency due to obesity.  She is on magnesium.   Plan: Will check for vitamin D  deficiency.   Prediabetes Last A1c was 5.7  Medication(s): none Lab Results  Component Value Date   HGBA1C 5.7 (H) 07/13/2024   HGBA1C 5.7 07/08/2023   HGBA1C 5.8 (H) 03/29/2022   No results found for: INSULIN   Plan: Will minimize all refined carbohydrates both sweets and starches.  Will work on the plan and exercise.  Consider both aerobic and resistance training.  Will keep protein, water, and fiber intake high.  Increase Polyunsaturated and Monounsaturated fats to increase satiety and encourage weight loss.  Aim for 7 to 9 hours of sleep nightly.   Hyperlipidemia LDL is at goal. Medication(s): Crestor  Cardiovascular risk factors: obesity (BMI >= 30 kg/m2) and sedentary lifestyle  Lab Results  Component Value Date   CHOL 187 07/13/2024   HDL 79 07/13/2024   LDLCALC 90 07/13/2024   TRIG 102 07/13/2024   CHOLHDL 2.4 07/13/2024   Lab Results  Component Value Date   ALT 13 07/13/2024   AST 14 07/13/2024   ALKPHOS 78 07/13/2024   BILITOT 0.4 07/13/2024   The 10-year ASCVD risk score (Arnett DK, et al., 2019) is: 1.1%   Values used to calculate the score:     Age: 54 years     Clinically relevant sex: Female     Is Non-Hispanic African American: No     Diabetic: No     Tobacco smoker: No     Systolic Blood Pressure: 114 mmHg     Is BP treated: Yes     HDL Cholesterol: 79 mg/dL     Total Cholesterol: 187 mg/dL  Plan:  Continue statin.  Will avoid all trans fats.  Will read labels Will minimize saturated fats except the following: low fat meats in moderation, diary, and limited dark chocolate.    History of congestive heart failure: Patient sees cardiologist approximately every 3 to 4 months.  She has a history of tetralogy of Fallot, and has a nonworking cardiac pacemaker.  She had a recent EKG  and will have another 1 in approximately a month.  She states that her energy is good at this time.   Plan:  Will follow-up with her cardiologist per her current schedule.  Will work on the plan and exercise as appropriate.  [name] had a positive depression screening. Depression is commonly associated with obesity and often results in emotional eating behaviors. We will monitor this closely and work on CBT to help improve the non-hunger eating patterns. Referral to Psychology may be required if no improvement is seen as she continues in our clinic.   Previous labs reviewed today. Date: 07/13/24 CMP, Lipids, HgbA1c, and TSH  Labs done today Insulin , Vit D, and Vit B12   Generalized Obesity: BMI (Calculated): 39.23   Evette is currently in the action stage of change and her goal is to begin weight loss efforts. I recommend Wilfred begin the structured treatment plan as follows:  She has agreed to Category 2 Plan  Exercise goals: All adults should avoid inactivity. Some activity is better than none, and adults who participate in any amount of physical activity, gain some health benefits.  Behavioral modification strategies:increasing lean protein intake, increasing vegetables, increase H2O intake, increase high fiber foods, no skipping meals, avoiding temptations, and planning for success  She was informed of the importance of frequent follow-up visits to maximize her success with intensive lifestyle modifications for her multiple health conditions. She was informed we would discuss her lab results at her next visit unless there is a critical issue that needs to be addressed sooner. Mozelle agreed to keep her next visit at the agreed upon time to discuss these results.  Objective:  General: Cooperative, alert, well developed, in no acute distress. HEENT: Conjunctivae and lids unremarkable. Cardiovascular: Regular rhythm.  Lungs: Normal work of breathing. Neurologic: No focal deficits.    Lab Results  Component Value Date   CREATININE 0.83 07/13/2024   BUN 17 07/13/2024   NA 142 07/13/2024   K 5.0 07/13/2024   CL 106 07/13/2024   CO2 23 07/13/2024   Lab Results  Component Value Date   ALT 13 07/13/2024   AST 14 07/13/2024   ALKPHOS 78 07/13/2024   BILITOT 0.4 07/13/2024   Lab Results  Component Value Date   HGBA1C 5.7 (H) 07/13/2024   HGBA1C 5.7 07/08/2023   HGBA1C 5.8 (H) 03/29/2022   No results found for: INSULIN  Lab Results  Component Value Date   TSH 1.670 07/13/2024   Lab Results  Component Value Date   CHOL 187 07/13/2024   HDL 79 07/13/2024   LDLCALC 90 07/13/2024   TRIG 102 07/13/2024   CHOLHDL 2.4 07/13/2024   Lab Results  Component Value Date   WBC 4.8 07/13/2024   HGB 12.9 07/13/2024   HCT 40.1 07/13/2024   MCV 89 07/13/2024   PLT 239 07/13/2024   No results found for: IRON, TIBC, FERRITIN  Attestation Statements:  Applicable history such as the following:  allergies, medications, problem list, medical history, surgical history, family history, social history, and previous encounter notes reviewed by clinician on day of visit:  Time spent on visit in care of the patient today including the items listed below was 42 minutes.    20 minutes were spent talking about the history, 20 minutes for face to face counseling implementing the plan, discussing the specifics of how to arrange meals, meal planning, water intake.   I spent face to face time discussing his/her plan, including breakfast, additional breakfast options, lunch, and dinner options, grocery list, and snacks.  I reviewed her indirect calorimetry. I discussed the implications for the diet plan.    Discussed the bio-impedence test (fat %, muscle mass, and water weight) and allowed the patient to ask questions.   Discussed the following information sheets: Category 2, Grocery List, 100 Calorie Snacks, 200 Calorie Snacks, Microwave Meals, and Protein shake  sheets.   I reviewed the labs which were ordered from her visit on 07/13/24.   I additionally spent time documenting, reviewing, and checking the codes before submitting.   This may have been prepared with the assistance of Engineer, Civil (consulting).  Occasional wrong-word or sound-a-like substitutions may have occurred due to the inherent limitations of voice recognition software.    Clayborne Daring, DO   "

## 2024-09-06 ENCOUNTER — Encounter (INDEPENDENT_AMBULATORY_CARE_PROVIDER_SITE_OTHER): Payer: Self-pay | Admitting: Bariatrics

## 2024-09-06 DIAGNOSIS — E559 Vitamin D deficiency, unspecified: Secondary | ICD-10-CM | POA: Insufficient documentation

## 2024-09-06 LAB — INSULIN, RANDOM: INSULIN: 13 u[IU]/mL (ref 2.6–24.9)

## 2024-09-06 LAB — VITAMIN D 25 HYDROXY (VIT D DEFICIENCY, FRACTURES): Vit D, 25-Hydroxy: 19.2 ng/mL — ABNORMAL LOW (ref 30.0–100.0)

## 2024-09-06 LAB — VITAMIN B12: Vitamin B-12: 386 pg/mL (ref 232–1245)

## 2024-09-16 ENCOUNTER — Encounter: Payer: Self-pay | Admitting: Bariatrics

## 2024-09-19 ENCOUNTER — Encounter: Payer: Self-pay | Admitting: Bariatrics

## 2024-09-19 ENCOUNTER — Ambulatory Visit: Admitting: Bariatrics

## 2024-09-19 VITALS — BP 111/84 | HR 60 | Ht 61.5 in | Wt 208.0 lb

## 2024-09-19 DIAGNOSIS — K5900 Constipation, unspecified: Secondary | ICD-10-CM

## 2024-09-19 DIAGNOSIS — E669 Obesity, unspecified: Secondary | ICD-10-CM

## 2024-09-19 DIAGNOSIS — E88819 Insulin resistance, unspecified: Secondary | ICD-10-CM | POA: Diagnosis not present

## 2024-09-19 DIAGNOSIS — E559 Vitamin D deficiency, unspecified: Secondary | ICD-10-CM

## 2024-09-19 DIAGNOSIS — Z8679 Personal history of other diseases of the circulatory system: Secondary | ICD-10-CM

## 2024-09-19 DIAGNOSIS — Z6838 Body mass index (BMI) 38.0-38.9, adult: Secondary | ICD-10-CM

## 2024-09-19 DIAGNOSIS — K5909 Other constipation: Secondary | ICD-10-CM

## 2024-09-19 DIAGNOSIS — Z954 Presence of other heart-valve replacement: Secondary | ICD-10-CM

## 2024-09-19 MED ORDER — WEGOVY 0.25 MG/0.5ML ~~LOC~~ SOAJ: 0.2500 mg | SUBCUTANEOUS | 0 refills | Status: AC

## 2024-09-19 MED ORDER — VITAMIN D (ERGOCALCIFEROL) 1.25 MG (50000 UNIT) PO CAPS: 50000.0000 [IU] | ORAL_CAPSULE | ORAL | 0 refills | Status: AC

## 2024-09-19 NOTE — Progress Notes (Signed)
 "                                                                                                                        First follow-up after initial visit.        WEIGHT SUMMARY AND BIOMETRICS  Weight Lost Since Last Visit: 3lb  Weight Gained Since Last Visit: 0   Vitals BP: 111/84 Pulse Rate: 60 SpO2: 99 %   Anthropometric Measurements Height: 5' 1.5 (1.562 m) Weight: 208 lb (94.3 kg) BMI (Calculated): 38.67 Weight at Last Visit: 211lb Weight Lost Since Last Visit: 3lb Weight Gained Since Last Visit: 0 Starting Weight: 211lb Total Weight Loss (lbs): 3 lb (1.361 kg) Peak Weight: 223lb   Body Composition  Body Fat %: 49.2 % Fat Mass (lbs): 102.4 lbs Muscle Mass (lbs): 100.4 lbs Total Body Water (lbs): 73.4 lbs Visceral Fat Rating : 14   Other Clinical Data Fasting: yes Labs: no Today's Visit #: 2 Starting Date: 09/05/24    OBESITY Aubreana is here to discuss her progress with her obesity treatment plan along with follow-up of her obesity related diagnoses.    Nutrition Plan: the Category 2 plan - 75% adherence.  Current exercise: Rowing bike.  Interim History:  She is down 3 lbs since her first visit.  Eating all of the food on the plan., Protein intake is as prescribed, and Water intake is inadequate.  Initial positives regarding the dietary plan:  Initial challenges regarding  the dietary plan:   Pharmacotherapy: No anti-obesity medications Hunger is moderately controlled.  Cravings are moderately controlled.  Assessment/Plan:   Vitamin D  Deficiency Vitamin D  is at goal of 50.  Most recent vitamin D  level was 19.2. She is on  prescription ergocalciferol  50,000 IU weekly. Lab Results  Component Value Date   VD25OH 19.2 (L) 09/05/2024    Plan: Begin prescription vitamin D  50,000 IU weekly.   Insulin  Resistance Linsi has had elevated fasting insulin  readings. Goal is HgbA1c < 5.7, fasting insulin  at l0 or less, and preferably at 5.  She   denies polyphagia. Medication(s): no medications.  Lab Results  Component Value Date   HGBA1C 5.7 (H) 07/13/2024   Lab Results  Component Value Date   INSULIN  13.0 09/05/2024    Plan Medication(s): none Will work on the agreed upon plan. Will minimize refined carbohydrates ( sweets and starches), and focus more on complex carbohydrates.  Increase the micronutrients found in leafy greens, which include magnesium, polyphenols, and vitamin C which have been postulated to help with insulin  sensitivity. Minimize fast food and cook more meals at home.  Increase fiber to 25 to 30 grams daily.  Information sheet on  Insulin  Resistance and Prediabetes.    Constipation Idella notes constipation.   This is likely related to the protein  She has been taking no medications. . Constipation is poorly controlled.   Plan: Increase fiber up to 25 to 30 grams of fiber.   Will also consider Psyllium at  manufacture dose.  Increase water intake to at least 64 ounces daily.  Add MiraLAX once daily.  May take twice a day or every other day based on results. Add Metamucil or Citrucel daily. Add magnesium citrate daily if needed    Generalized Obesity: Current BMI BMI (Calculated): 38.67   Pharmacotherapy Plan Continue and refill and Start  Wegovy  0.25 mg SQ weekly  Angy is currently in the action stage of change. As such, her goal is to continue with weight loss efforts.  She has agreed to the Category 2 plan.  Exercise goals: All adults should avoid inactivity. Some physical activity is better than none, and adults who participate in any amount of physical activity gain some health benefits.  Behavioral modification strategies: increasing lean protein intake, decreasing simple carbohydrates , no meal skipping, meal planning , increase water intake, better snacking choices, planning for success, increasing vegetables, increasing fiber rich foods, avoiding temptations, keep healthy foods in  the home, travel eating strategies, increase frequency of journaling, weigh protein portions, measure portion sizes, and mindful eating.  Teddy has agreed to follow-up with our clinic in 2 weeks.   Labs reviewed today from last visit ( insulin , vitamin D , B 12).   Objective:   VITALS: Per patient if applicable, see vitals. GENERAL: Alert and in no acute distress. CARDIOPULMONARY: No increased WOB. Speaking in clear sentences.  PSYCH: Pleasant and cooperative. Speech normal rate and rhythm. Affect is appropriate. Insight and judgement are appropriate. Attention is focused, linear, and appropriate.  NEURO: Oriented as arrived to appointment on time with no prompting.   Attestation Statements:   This was prepared with the assistance of Engineer, Civil (consulting).  Occasional wrong-word or sound-a-like substitutions may have occurred due to the inherent limitations of voice recognition software.   Clayborne Daring, DO  "

## 2024-09-24 ENCOUNTER — Encounter: Payer: Self-pay | Admitting: Internal Medicine

## 2024-09-24 ENCOUNTER — Other Ambulatory Visit: Payer: Self-pay | Admitting: Internal Medicine

## 2024-09-24 MED ORDER — METOPROLOL SUCCINATE ER 25 MG PO TB24: 25.0000 mg | ORAL_TABLET | Freq: Every day | ORAL | 0 refills | Status: AC

## 2024-09-26 ENCOUNTER — Telehealth: Payer: Self-pay

## 2024-09-26 NOTE — Telephone Encounter (Signed)
 [employer]  denied with message as follow from plan:  Message from plan: Denied. This health benefit plan does not cover the following services, supplies, drugs or charges: Any treatment or regimen, medical or surgical, for the purpose of reducing or controlling the weight of the member, or for the treatment of obesity, except for surgical treatment of morbid obesity, or as specifically covered by this health benefit plan.

## 2024-09-26 NOTE — Telephone Encounter (Signed)
 Pt had a lipid panel done on 07/13/2024 and pt has an upcoming appt with Dr. Okey on 10/11/2024

## 2024-09-26 NOTE — Telephone Encounter (Signed)
 Started PA for [facility] 0.25mg  via covermymeds.

## 2024-09-28 ENCOUNTER — Telehealth: Payer: Self-pay | Admitting: Pharmacy Technician

## 2024-09-28 ENCOUNTER — Other Ambulatory Visit (HOSPITAL_COMMUNITY): Payer: Self-pay

## 2024-09-28 NOTE — Telephone Encounter (Signed)
" ° °  Insurance will pay for generic per test claim 15.00 for 30 days   Notified pharmacy and patient "

## 2024-10-11 ENCOUNTER — Ambulatory Visit: Admitting: Internal Medicine

## 2024-10-17 ENCOUNTER — Ambulatory Visit: Admitting: Bariatrics

## 2025-07-26 ENCOUNTER — Encounter (HOSPITAL_BASED_OUTPATIENT_CLINIC_OR_DEPARTMENT_OTHER): Admitting: Family Medicine
